# Patient Record
Sex: Male | Born: 1935 | Race: White | Hispanic: No | Marital: Married | Smoking: Never smoker
Health system: Southern US, Community
[De-identification: ages and names within clinical notes are randomized; demographics above are authoritative.]

## PROBLEM LIST (undated history)

## (undated) DIAGNOSIS — Z923 Personal history of irradiation: Secondary | ICD-10-CM

## (undated) DIAGNOSIS — E785 Hyperlipidemia, unspecified: Secondary | ICD-10-CM

## (undated) DIAGNOSIS — M1990 Unspecified osteoarthritis, unspecified site: Secondary | ICD-10-CM

## (undated) DIAGNOSIS — I482 Chronic atrial fibrillation, unspecified: Secondary | ICD-10-CM

## (undated) DIAGNOSIS — I350 Nonrheumatic aortic (valve) stenosis: Secondary | ICD-10-CM

## (undated) DIAGNOSIS — I10 Essential (primary) hypertension: Secondary | ICD-10-CM

## (undated) DIAGNOSIS — C61 Malignant neoplasm of prostate: Secondary | ICD-10-CM

## (undated) DIAGNOSIS — I499 Cardiac arrhythmia, unspecified: Secondary | ICD-10-CM

## (undated) HISTORY — DX: Malignant neoplasm of prostate: C61

## (undated) HISTORY — DX: Hyperlipidemia, unspecified: E78.5

## (undated) HISTORY — PX: CATARACT EXTRACTION, BILATERAL: SHX1313

## (undated) HISTORY — DX: Chronic atrial fibrillation, unspecified: I48.20

## (undated) HISTORY — PX: HERNIA REPAIR: SHX51

## (undated) HISTORY — DX: Nonrheumatic aortic (valve) stenosis: I35.0

## (undated) HISTORY — DX: Essential (primary) hypertension: I10

---

## 1958-08-28 DIAGNOSIS — I059 Rheumatic mitral valve disease, unspecified: Secondary | ICD-10-CM | POA: Insufficient documentation

## 1999-08-25 ENCOUNTER — Encounter: Admission: RE | Admit: 1999-08-25 | Discharge: 1999-08-25 | Payer: Self-pay | Admitting: *Deleted

## 1999-08-25 ENCOUNTER — Encounter: Payer: Self-pay | Admitting: *Deleted

## 2000-07-31 ENCOUNTER — Encounter: Payer: Self-pay | Admitting: Emergency Medicine

## 2000-07-31 ENCOUNTER — Inpatient Hospital Stay (HOSPITAL_COMMUNITY): Admission: EM | Admit: 2000-07-31 | Discharge: 2000-08-01 | Payer: Self-pay | Admitting: *Deleted

## 2001-09-20 ENCOUNTER — Ambulatory Visit (HOSPITAL_BASED_OUTPATIENT_CLINIC_OR_DEPARTMENT_OTHER): Admission: RE | Admit: 2001-09-20 | Discharge: 2001-09-20 | Payer: Self-pay | Admitting: Surgery

## 2001-09-20 ENCOUNTER — Encounter (INDEPENDENT_AMBULATORY_CARE_PROVIDER_SITE_OTHER): Payer: Self-pay | Admitting: Specialist

## 2002-09-16 ENCOUNTER — Inpatient Hospital Stay (HOSPITAL_COMMUNITY): Admission: AD | Admit: 2002-09-16 | Discharge: 2002-09-18 | Payer: Self-pay | Admitting: Cardiology

## 2002-09-16 ENCOUNTER — Encounter: Payer: Self-pay | Admitting: Cardiology

## 2003-12-29 ENCOUNTER — Encounter: Admission: RE | Admit: 2003-12-29 | Discharge: 2003-12-29 | Payer: Self-pay | Admitting: Internal Medicine

## 2005-04-21 ENCOUNTER — Ambulatory Visit: Payer: Self-pay | Admitting: Oncology

## 2006-04-18 ENCOUNTER — Ambulatory Visit: Payer: Self-pay | Admitting: Oncology

## 2006-04-23 LAB — CBC WITH DIFFERENTIAL/PLATELET
BASO%: 0.3 % (ref 0.0–2.0)
Eosinophils Absolute: 0 10*3/uL (ref 0.0–0.5)
HCT: 44.8 % (ref 38.7–49.9)
LYMPH%: 27.8 % (ref 14.0–48.0)
MCHC: 35.3 g/dL (ref 32.0–35.9)
MONO#: 0.5 10*3/uL (ref 0.1–0.9)
NEUT%: 60.4 % (ref 40.0–75.0)
Platelets: 152 10*3/uL (ref 145–400)
WBC: 5.1 10*3/uL (ref 4.0–10.0)

## 2006-06-27 ENCOUNTER — Encounter: Admission: RE | Admit: 2006-06-27 | Discharge: 2006-06-27 | Payer: Self-pay | Admitting: Internal Medicine

## 2009-10-25 DIAGNOSIS — N529 Male erectile dysfunction, unspecified: Secondary | ICD-10-CM | POA: Insufficient documentation

## 2009-10-25 DIAGNOSIS — E1142 Type 2 diabetes mellitus with diabetic polyneuropathy: Secondary | ICD-10-CM | POA: Insufficient documentation

## 2009-10-25 DIAGNOSIS — M810 Age-related osteoporosis without current pathological fracture: Secondary | ICD-10-CM | POA: Insufficient documentation

## 2010-04-11 ENCOUNTER — Ambulatory Visit: Payer: Self-pay | Admitting: Cardiology

## 2010-05-09 ENCOUNTER — Ambulatory Visit: Payer: Self-pay | Admitting: Cardiovascular Disease

## 2010-05-25 ENCOUNTER — Ambulatory Visit: Payer: Self-pay | Admitting: Cardiology

## 2010-05-26 ENCOUNTER — Ambulatory Visit (HOSPITAL_COMMUNITY): Admission: RE | Admit: 2010-05-26 | Discharge: 2010-05-26 | Payer: Self-pay | Admitting: Urology

## 2010-05-29 ENCOUNTER — Ambulatory Visit
Admission: RE | Admit: 2010-05-29 | Discharge: 2010-08-27 | Payer: Self-pay | Source: Home / Self Care | Attending: Radiation Oncology | Admitting: Radiation Oncology

## 2010-06-08 ENCOUNTER — Ambulatory Visit: Payer: Self-pay | Admitting: Cardiology

## 2010-07-04 ENCOUNTER — Ambulatory Visit: Payer: Self-pay | Admitting: Cardiology

## 2010-07-20 ENCOUNTER — Ambulatory Visit: Payer: Self-pay | Admitting: Cardiology

## 2010-07-20 DIAGNOSIS — Z8546 Personal history of malignant neoplasm of prostate: Secondary | ICD-10-CM | POA: Insufficient documentation

## 2010-08-30 ENCOUNTER — Ambulatory Visit
Admission: RE | Admit: 2010-08-30 | Discharge: 2010-09-27 | Payer: Self-pay | Source: Home / Self Care | Attending: Radiation Oncology | Admitting: Radiation Oncology

## 2010-09-27 ENCOUNTER — Ambulatory Visit: Payer: Medicare Other | Attending: Radiation Oncology | Admitting: Radiation Oncology

## 2010-09-27 DIAGNOSIS — C61 Malignant neoplasm of prostate: Secondary | ICD-10-CM | POA: Insufficient documentation

## 2010-09-27 DIAGNOSIS — Z510 Encounter for antineoplastic radiation therapy: Secondary | ICD-10-CM | POA: Insufficient documentation

## 2010-09-28 ENCOUNTER — Ambulatory Visit: Payer: Medicare Other | Attending: Radiation Oncology | Admitting: Radiation Oncology

## 2010-09-28 DIAGNOSIS — C61 Malignant neoplasm of prostate: Secondary | ICD-10-CM | POA: Insufficient documentation

## 2010-09-28 DIAGNOSIS — Z510 Encounter for antineoplastic radiation therapy: Secondary | ICD-10-CM | POA: Insufficient documentation

## 2010-11-30 ENCOUNTER — Ambulatory Visit: Payer: Medicare Other | Attending: Radiation Oncology | Admitting: Radiation Oncology

## 2010-12-21 ENCOUNTER — Other Ambulatory Visit: Payer: Self-pay | Admitting: *Deleted

## 2010-12-21 DIAGNOSIS — Z79899 Other long term (current) drug therapy: Secondary | ICD-10-CM

## 2010-12-21 DIAGNOSIS — E7800 Pure hypercholesterolemia, unspecified: Secondary | ICD-10-CM

## 2010-12-26 ENCOUNTER — Encounter: Payer: Self-pay | Admitting: Cardiology

## 2010-12-26 DIAGNOSIS — I482 Chronic atrial fibrillation, unspecified: Secondary | ICD-10-CM | POA: Insufficient documentation

## 2010-12-26 DIAGNOSIS — E785 Hyperlipidemia, unspecified: Secondary | ICD-10-CM | POA: Insufficient documentation

## 2010-12-26 DIAGNOSIS — C61 Malignant neoplasm of prostate: Secondary | ICD-10-CM | POA: Insufficient documentation

## 2010-12-26 DIAGNOSIS — I10 Essential (primary) hypertension: Secondary | ICD-10-CM | POA: Insufficient documentation

## 2011-01-02 ENCOUNTER — Other Ambulatory Visit (INDEPENDENT_AMBULATORY_CARE_PROVIDER_SITE_OTHER): Payer: Medicare Other | Admitting: *Deleted

## 2011-01-02 ENCOUNTER — Encounter: Payer: Self-pay | Admitting: Cardiology

## 2011-01-02 ENCOUNTER — Ambulatory Visit (INDEPENDENT_AMBULATORY_CARE_PROVIDER_SITE_OTHER): Payer: Medicare Other | Admitting: Cardiology

## 2011-01-02 VITALS — BP 122/86 | HR 70 | Ht 73.0 in | Wt 169.4 lb

## 2011-01-02 DIAGNOSIS — Z79899 Other long term (current) drug therapy: Secondary | ICD-10-CM

## 2011-01-02 DIAGNOSIS — E7800 Pure hypercholesterolemia, unspecified: Secondary | ICD-10-CM

## 2011-01-02 DIAGNOSIS — I4891 Unspecified atrial fibrillation: Secondary | ICD-10-CM

## 2011-01-02 DIAGNOSIS — I482 Chronic atrial fibrillation, unspecified: Secondary | ICD-10-CM

## 2011-01-02 LAB — LIPID PANEL
LDL Cholesterol: 63 mg/dL (ref 0–99)
Total CHOL/HDL Ratio: 3

## 2011-01-02 LAB — HEPATIC FUNCTION PANEL
Albumin: 4.1 g/dL (ref 3.5–5.2)
Alkaline Phosphatase: 32 U/L — ABNORMAL LOW (ref 39–117)
Bilirubin, Direct: 0.1 mg/dL (ref 0.0–0.3)

## 2011-01-02 LAB — CBC WITH DIFFERENTIAL/PLATELET
Basophils Relative: 0.3 % (ref 0.0–3.0)
Eosinophils Absolute: 0 10*3/uL (ref 0.0–0.7)
Lymphs Abs: 1 10*3/uL (ref 0.7–4.0)
MCHC: 35.2 g/dL (ref 30.0–36.0)
MCV: 102.3 fl — ABNORMAL HIGH (ref 78.0–100.0)
Monocytes Absolute: 0.4 10*3/uL (ref 0.1–1.0)
Neutro Abs: 3 10*3/uL (ref 1.4–7.7)
Neutrophils Relative %: 67.7 % (ref 43.0–77.0)
RBC: 3.81 Mil/uL — ABNORMAL LOW (ref 4.22–5.81)

## 2011-01-02 LAB — BASIC METABOLIC PANEL
CO2: 30 mEq/L (ref 19–32)
Chloride: 103 mEq/L (ref 96–112)
Creatinine, Ser: 0.8 mg/dL (ref 0.4–1.5)

## 2011-01-02 NOTE — Assessment & Plan Note (Signed)
Asymptomatic in this point in time. I'll have him return in 6 months to see Dr. [place]. He'll see Dr. Clelia Croft for primary care and lab.

## 2011-01-02 NOTE — Progress Notes (Signed)
Subjective:   Henry Holland is seen today in followup for his chronic atrial fibrillation. He finished radiation therapy for prostate cancer in February and overall has been doing reasonably well. He sees Dr. Clelia Croft for primary care his chronic warfarin is checked by [facility]. He's not had any chest pain, lightheadedness, or dizziness. Lipids are monitored by Dr. Clelia Croft.  Current Outpatient Prescriptions  Medication Sig Dispense Refill  . alendronate (FOSAMAX) 70 MG tablet Take 70 mg by mouth every 7 (seven) days. Take with a full glass of water on an empty stomach.       . Calcium Citrate (CITRACAL PO) Take 600 mg by mouth 2 (two) times daily.        . digoxin (LANOXIN) 0.25 MG tablet Take 250 mcg by mouth daily.        Marland Kitchen diltiazem (CARDIZEM CD) 180 MG 24 hr capsule Take 180 mg by mouth daily.        . fish oil-omega-3 fatty acids 1000 MG capsule Take by mouth daily.        Marland Kitchen losartan (COZAAR) 100 MG tablet Take 100 mg by mouth daily.        . Multiple Vitamin (MULTIVITAMIN) tablet Take 1 tablet by mouth daily.        . rosuvastatin (CRESTOR) 5 MG tablet Take 5 mg by mouth daily.        . Sildenafil Citrate (VIAGRA PO) Take by mouth as needed.        . Vitamin D, Cholecalciferol, 400 UNITS CHEW Chew by mouth daily.        Marland Kitchen warfarin (COUMADIN) 5 MG tablet Take 5 mg by mouth daily. AS DIRECTED         No Known Allergies  Patient Active Problem List  Diagnoses  . Hypertension  . Chronic atrial fibrillation  . Prostate cancer  . Hyperlipidemia    History  Smoking status  . Never Smoker   Smokeless tobacco  . Never Used    History  Alcohol Use No    Family History  Problem Relation Age of Onset  . Stroke Mother 32    Review of Systems:   The patient denies any heat or cold intolerance.  No weight gain or weight loss.  The patient denies headaches or blurry vision.  There is no cough or sputum production.  The patient denies dizziness.  There is no hematuria or hematochezia.  The  patient denies any muscle aches or arthritis.  The patient denies any rash.  The patient denies frequent falling or instability.  There is no history of depression or anxiety.  All other systems were reviewed and are negative.   Physical Exam:   Weights 172 blood pressure is 116/78. Heart rates 80 and irregular.The head is normocephalic and atraumatic.  Pupils are equally round and reactive to light.  Sclerae nonicteric.  Conjunctiva is clear.  Oropharynx is unremarkable.  There's adequate oral airway.  Neck is supple there are no masses.  Thyroid is not enlarged.  There is no lymphadenopathy.  Lungs are clear.  Chest is symmetric.  Heart shows a regular rate and rhythm.  S1 and S2 are normal.  There is no murmur click or gallop.  Abdomen is soft normal bowel sounds.  There is no organomegaly.  Genital and rectal deferred.  Extremities are without edema.  Peripheral pulses are adequate.  Neurologically intact.  Full range of motion.  The patient is not depressed.  Skin is warm and dry.  Assessment / Plan:

## 2011-01-03 ENCOUNTER — Telehealth: Payer: Self-pay | Admitting: Cardiology

## 2011-01-03 DIAGNOSIS — E785 Hyperlipidemia, unspecified: Secondary | ICD-10-CM

## 2011-01-03 DIAGNOSIS — I482 Chronic atrial fibrillation, unspecified: Secondary | ICD-10-CM

## 2011-01-03 NOTE — Telephone Encounter (Signed)
Results from lab. Left message that results were in to call back for further info. Results ok will recheck in six months and reminder letter will be sent.

## 2011-01-04 ENCOUNTER — Telehealth: Payer: Self-pay | Admitting: Cardiology

## 2011-01-04 NOTE — Telephone Encounter (Signed)
Pt returned phone call for lab results. Results reviewed and pt informed of 6 month recheck on labs. Reminder will be sent pt verbalizes understanding.

## 2011-01-13 NOTE — H&P (Signed)
Henry Holland, Henry Holland                               ACCOUNT NO.:  [id number]   MEDICAL RECORD NO.:  [id number]                   PATIENT TYPE:   LOCATION:                                       FACILITY:   PHYSICIAN:  Colleen Can. Deborah Chalk, M.D.            DATE OF BIRTH:  12-23-1932   DATE OF ADMISSION:  09/16/2002  DATE OF DISCHARGE:                                HISTORY & PHYSICAL   CHIEF COMPLAINT:  Paroxysmal atrial fibrillation.   HISTORY OF PRESENT ILLNESS:  The patient is a pleasant 77 year old white  male who has had recurrent atrial fibrillation and atrial flutter. He had a  previous event monitor placed,which demonstrated his atrial arrhythmia. For  the most part, he has been asymptomatic but has had several episodes in  which he felt like his heart rate was approximately 160. Some of his  episodes have been precipitated by chocolate, alcohol, and Viagra, as well  as stress. He has subsequently been started on Coumadin anticoagulation as  well as low dose beta blocker therapy. He is now admitted electively for  initiation of Flecainide.   PAST MEDICAL HISTORY:  1. Paroxysmal atrial fibrillation.  2. Hypertension.  3. History of hernia repair times two.  4. Coumadin therapy.  5. Hyperlipidemia.   ALLERGIES:  None.   CURRENT MEDICATIONS:  1. Coumadin daily.  2. Aspirin daily.  3. Lipitor 10 mg daily.  4. Verapamil 180 mg daily.  5. Multivitamin daily.  6. Cozaar 50 mg per day.  7. Viagra as needed.  8. Toprol XL 25 mg per day.   FAMILY HISTORY:  Father's history is unknown. Mother died at 42 years of  stroke.   SOCIAL HISTORY:  He is widower. His wife died of breast cancer. He is  retired from [employer]. He has one daughter who lives in [place]. He is a  non-smoker. He does have social alcohol use.   REVIEW OF SYSTEMS:  Is as noted above. Otherwise unremarkable.   PHYSICAL EXAMINATION:  GENERAL: Pleasant male who appears younger than  stated age.  VITAL SIGNS:  Blood pressure 110/60 sitting and 120/70 standing. Heart rate  72 and regular. Respiratory rate 18. He is afebrile. Weight 177 pounds.  SKIN: Warm and dry. Color unremarkable.  LUNGS: Clear.  HEART: Regular rate and rhythm.  ABDOMEN: Soft with positive bowel sounds. Nontender.  EXTREMITIES: Without edema.  NEURO: Intact.   LABORATORY DATA:  Pending.   IMPRESSION:  1. Paroxysmal atrial fibrillation/flutter.  2. Hypertension.   PLAN:  Will proceed on with elective admission to initiate Flecainide.  Further treatment plan and follow-up as per Dr. Ronnald Nian discretion.      Juanell Fairly C. Earl Gala, N.P.                 Colleen Can. Deborah Chalk, M.D.    LCO/MEDQ  D:  09/12/2002  T:  09/12/2002  Job:  [id number]   cc:   Colleen Can. Deborah Chalk, M.D.  [address], [address]  [place]  [place] [address]  Fax: [phone]   Wilson Singer, M.D.  [address]  [place]  [place] [address]  Fax: [phone]

## 2011-01-13 NOTE — Op Note (Signed)
[place]  Patient:    Henry Holland, Henry Holland Visit Number: [id number] MRN: [id number]          Service Type: DSU Location: [facility] Attending Physician:  Charlton Haws Dictated by:   Currie Paris, M.D. Proc. Date: 09/20/01 Admit Date:  09/20/2001   CC:         Lilly Cove, M.D.   Operative Report  CCS #[id number]  PREOPERATIVE DIAGNOSIS:  Recurrent right inguinal hernia.  POSTOPERATIVE DIAGNOSIS:  Recurrent right inguinal hernia - indirect.  OPERATION: Repair with mesh.  SURGEON:  Currie Paris, M.D.  ANESTHESIA:  MAC  CLINICAL HISTORY:  This patient is a 77 year old with a recurrent right inguinal hernia.  His original repair was many many years ago.  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  DESCRIPTION OF PROCEDURE:  The patient was seen in the holding area and the operative site identified and marked with the patient standing up, so we could identify the bulge.  He was then taken to the operating room.  IV sedation was given.  The area was shaved, prepped and draped.  Xylocaine 1% mixed equally with 0.5% Marcaine with epinephrine was used for local and infiltrated along the old scar and below fascia at the anterior superior iliac spine.  The medial two-thirds of the old scar was incised.  Subcutaneous tissues were divided as well as some scar tissue where the Scarpas had been repaired, so I was able to identify the external oblique.  The cord was really stuck up into this and had to be carefully dissected off and I dissected and freed up the external oblique.  It had been closed with old wire sutures.  The defect was readily apparent right at the area that had been marked preoperatively and palpated.  It appeared to be coming through the deep ring.  As I freed the cord up some more, I could see that the external oblique had been  closed medial to the deep ring in effect "transposing the cord."  Once I had all this freed up and I had opened up the external oblique a little bit more laterally for better exposure, I dissected into the cord and trimmed off some preperitoneal fat that was coming through as well as identifying and excising a hernia sac.  The floor was really intact medially between the prior repair and the defect was just a large deep ring.  I was able to occlude this nicely with a mesh plug and hold that in place by closing the transversalis over it with a 2-0 Prolene.  I thought this produced a nice occlusion of the defect itself and I decided not to open up the external oblique distally and then try to reclose it over the cord but simply left that since it appeared to be intact alone.  The area was checked for hemostasis and appeared to be dry.  I closed the external oblique with 3-0 Vicryl, Scarpas with 3-0 Vicryl, and the skin with 4-0 Monocryl subcuticular plus Steri-Strips.  The patient tolerated the procedure well.  There were no operative complications.  All counts were correct. Dictated by:   Currie Paris, M.D. Attending Physician:  Charlton Haws DD:  09/20/01 TD:  09/21/01 Job: [id number]

## 2011-01-13 NOTE — Discharge Summary (Signed)
[place]  Patient:    Henry Holland, Henry Holland                          MRN: [id number] Adm. Date:  [id number] Disc. Date: [id number] Attending:  Eleanora Neighbor Dictator:   Jennet Maduro. Earl Gala, R.N., A.N.P.-C. CC:         Georg Ruddle. Viviann Spare, M.D.   Discharge Summary  PRIMARY DISCHARGE DIAGNOSIS:  New onset of atrial flutter with subsequent conversion to normal sinus rhythm.  SECONDARY DISCHARGE DIAGNOSiS:  Hypertension with noted left ventricular hypertrophy on 2-D echocardiogram.  HISTORY OF PRESENT ILLNESS:  Henry Holland is a very pleasant 77 year old white male who presents to the emergency department on July 31, 2000, with new onset atrial flutter.  It was of questionable duration but probably since this past Saturday.  He had had a previous URI, which had varying intensity and severity of symptoms.  There had been no light-headedness, no dizziness, no frank syncope.  He really did not have an awareness of his tachycardia until he took his blood pressure on Monday morning with his kit at home, and there he noted to have a heart rate of 156.  He made an appointment to see Dr. Viviann Spare the following day where he was noted to be exhibiting a narrow QRS complex with tachycardia.  He was subsequently brought to the emergency room and given adenosine and subsequently noted to have an atrial flutter.  He was subsequently admitted for further evaluation.  Please see the dictated history and physical for further patient presentation and profile.  LABORATORY DATA ON ADMISSION:  Hematocrit 49, white count 5, platelets 169. Chemistries were satisfactory with sodium 139, potassium 4.3, chloride 106, CO2 29, BUN 16, creatinine 1, glucose 96.  HOSPITAL COURSE:  The patient was admitted.  He was started on heparin and continued on IV Cardizem that had been initiated in the emergency room.  He was also loaded with Lanoxin.  Approximately 3 a.m. on June 01, 2000,  he converted to normal sinus rhythm.  He was unaware of this.  2-D echocardiogram was performed during this admission which does show a moderate degree of left ventricular hypertrophy.  It is felt that more than likely, his arrhythmia is due to probable longstanding hypertension.  On the evening of August 01, 2000, he was doing well without problems and was felt to be stable for discharge today.  CONDITION ON DISCHARGE:  Improved.  DISCHARGE MEDICATIONS:  He will resume his Zestril 5 mg a day, and he is started on verapamil SR 180 mg a day, and he will take an aspirin daily.  ACTIVITY:  As tolerated.  DIET:  Low fat, low salt.  SPECIAL INSTRUCTIONS:  He is to call our office for any problems.  Otherwise we will plan on seeing him in about 7-10 days.  Will need to set up for stress Cardiolite study at that visit as well as evaluated lipids. DD:  08/02/00 TD:  08/02/00 Job: [id number]

## 2011-01-13 NOTE — Discharge Summary (Signed)
NAMEJANICE, SEALES                             ACCOUNT NO.:  [id number]   MEDICAL RECORD NO.:  [id number]                   PATIENT TYPE:  INP   LOCATION:  2040                                 FACILITY:  [facility]   PHYSICIAN:  Colleen Can. Deborah Chalk, M.D.            DATE OF BIRTH:  Jan 26, 1933   DATE OF ADMISSION:  09/16/2002  DATE OF DISCHARGE:  09/18/2002                                 DISCHARGE SUMMARY   PRIMARY DISCHARGE DIAGNOSIS:  Paroxysmal atrial fibrillation with successful  initiation of flecainide therapy.   SECONDARY DISCHARGE DIAGNOSES:  1. Hypertensive heart disease.  2. Coumadin therapy.  3. Hyperlipidemia.   HISTORY OF PRESENT ILLNESS:  The patient is a pleasant 77 year old male who  has had recurrent atrial fibrillation/flutter.  He had a previous event  monitor placed which demonstrated his atrial arrhythmia.  He was  subsequently started on Coumadin anticoagulation as well as low-dose beta  blocker.  There were possible adverse effects with Toprol; he was  complaining of some voice changes.  He was subsequently admitted electively  for initiation of antiarrhythmic therapy.   Please see dictated history and physical for further patient presentation  and profile.   LABORATORY DATA ON ADMISSION:  Chemistries were normal.   Magnesium was 2.1.   INR was 1.3.   CBC showed a hemoglobin of 14, hematocrit 41, white count 4.9, platelets  122,000.   Chest x-ray is pending final dictation.   Twelve-lead electrocardiogram showed sinus bradycardia.   HOSPITAL COURSE:  The patient was admitted electively to telemetry.  He was  started on flecainide 50 mg b.i.d., which was tolerated well.  He did have  bradycardia on telemetry with heart rates in the low 50s.  His Toprol and  verapamil were subsequently discontinued.  He is tolerating his current  medical regimen without problems.  He has remained in normal sinus rhythm.  Plans are now made for him to be discharged today  with further evaluation on  an outpatient basis.   DISCHARGE CONDITION:  Stable.   DISCHARGE MEDICATIONS:  1. We will stop Toprol.  2. We will start verapamil.   He will stay on:  1. Cozaar 50 mg a day.  2. Lipitor 10 mg a day.  3. Multivitamin as before.  4. Baby aspirin daily.  5. We will continue Coumadin 7.5 mg daily.  6. We will add flecainide 50 mg b.i.d.   FOLLOWUP:  He is to call to set up a treadmill testing in the office in the  next 7 to 10 days and call if problems arise sooner.     Juanell Fairly C. Earl Gala, N.P.                 Colleen Can. Deborah Chalk, M.D.    LCO/MEDQ  D:  09/18/2002  T:  09/19/2002  Job:  [id number]   cc:   Wilson Singer, M.D.  [address]  [place]  [place] [address]  Fax: [phone]

## 2011-01-13 NOTE — Op Note (Signed)
[place]  Patient:    Henry Holland, Henry Holland                          MRN: [id number] Proc. Date: 08/02/00 Adm. Date:  [id number] Disc. Date: [id number] Attending:  Eleanora Neighbor                           Operative Report  NO DICTATION PROVIDED DD:  08/02/00 TD:  08/02/00 Job: [id number]

## 2011-05-12 DIAGNOSIS — M19049 Primary osteoarthritis, unspecified hand: Secondary | ICD-10-CM | POA: Insufficient documentation

## 2011-07-28 ENCOUNTER — Other Ambulatory Visit: Payer: Self-pay | Admitting: Dermatology

## 2011-09-11 DIAGNOSIS — D696 Thrombocytopenia, unspecified: Secondary | ICD-10-CM | POA: Insufficient documentation

## 2011-09-27 ENCOUNTER — Encounter: Payer: Self-pay | Admitting: Cardiology

## 2011-09-27 ENCOUNTER — Ambulatory Visit (INDEPENDENT_AMBULATORY_CARE_PROVIDER_SITE_OTHER): Payer: Medicare Other | Admitting: Cardiology

## 2011-09-27 VITALS — BP 160/90 | HR 61 | Ht 74.0 in | Wt 176.2 lb

## 2011-09-27 DIAGNOSIS — I10 Essential (primary) hypertension: Secondary | ICD-10-CM

## 2011-09-27 DIAGNOSIS — I482 Chronic atrial fibrillation, unspecified: Secondary | ICD-10-CM

## 2011-09-27 DIAGNOSIS — I4891 Unspecified atrial fibrillation: Secondary | ICD-10-CM

## 2011-09-27 DIAGNOSIS — E785 Hyperlipidemia, unspecified: Secondary | ICD-10-CM

## 2011-09-27 MED ORDER — DIGOXIN 125 MCG PO TABS: 125.0000 ug | ORAL_TABLET | Freq: Every day | ORAL | Status: DC

## 2011-09-27 NOTE — Progress Notes (Signed)
Henry Holland Date of Birth: August 09, 1933 Medical Record #[id number]  History of Present Illness: Mr. Henry Holland is seen today to establish cardiac care. He is a former patient of Dr. Deborah Chalk. He has a history of chronic atrial fibrillation dating back to 2002. This has been managed with rate control and anticoagulation. He has no history of coronary disease. He has had several stress tests over the years the last January 2009 which was normal. He had an echocardiogram in 2003 which showed mild mitral and tricuspid insufficiency with normal LV function. He reports that he is doing very well. He works out 5 days a week. His blood pressure is usually low. He denies any dizziness, syncope, chest pain, or shortness of breath. He has no edema or orthopnea.  Current Outpatient Prescriptions on File Prior to Visit  Medication Sig Dispense Refill  . Calcium Citrate (CITRACAL PO) Take 600 mg by mouth 2 (two) times daily.        Marland Kitchen diltiazem (CARDIZEM CD) 180 MG 24 hr capsule Take 180 mg by mouth daily.        . fish oil-omega-3 fatty acids 1000 MG capsule Take by mouth daily.        Marland Kitchen losartan (COZAAR) 100 MG tablet Take 100 mg by mouth daily.        . Multiple Vitamin (MULTIVITAMIN) tablet Take 1 tablet by mouth daily.        . rosuvastatin (CRESTOR) 5 MG tablet Take 5 mg by mouth daily.        . Vitamin D, Cholecalciferol, 400 UNITS CHEW Chew by mouth daily.        Marland Kitchen warfarin (COUMADIN) 5 MG tablet Take 5 mg by mouth daily. AS DIRECTED         No Known Allergies  Past Medical History  Diagnosis Date  . Hypertension   . Chronic atrial fibrillation   . Prostate cancer   . Hyperlipidemia     Past Surgical History  Procedure Date  . Hernia repair     X2    History  Smoking status  . Never Smoker   Smokeless tobacco  . Never Used    History  Alcohol Use No    Family History  Problem Relation Age of Onset  . Stroke Mother 76    Review of Systems: As noted in history of present illness.   All other systems were reviewed and are negative.  Physical Exam: BP 160/90  Pulse 61  Ht 6\' 2"  (1.88 m)  Wt 176 lb 3.2 oz (79.924 kg)  BMI 22.62 kg/m2 He is a pleasant white male in no acute distress.The patient is alert and oriented x 3.  The mood and affect are normal.  The skin is warm and dry.  Color is normal.  The HEENT exam reveals that the sclera are nonicteric.  The mucous membranes are moist.  The carotids are 2+ without bruits.  There is no thyromegaly.  There is no JVD.  The lungs are clear.  The chest wall is non tender.  The heart exam reveals an irregular rate with a normal S1 and S2.  There are no murmurs, gallops, or rubs.  The PMI is not displaced.   Abdominal exam reveals good bowel sounds.  There is no guarding or rebound.  There is no hepatosplenomegaly or tenderness.  There are no masses.  Exam of the legs reveal no clubbing, cyanosis, or edema.  The legs are without rashes.  The distal pulses  are intact.  Cranial nerves II - XII are intact.  Motor and sensory functions are intact.  The gait is normal.  LABORATORY DATA: ECG demonstrates atrial fibrillation with a slow ventricular response rate 61 beats per minute. He has a 2 second pulse noted on ECG. There is an incomplete right bundle branch block. There is ST T wave changes consistent with dig effect.  Assessment / Plan:

## 2011-09-27 NOTE — Patient Instructions (Signed)
Reduce your Lanoxin to .125 mg daily.  Continue your other medication.  I will see you again in one year.

## 2011-09-27 NOTE — Assessment & Plan Note (Signed)
Blood pressure is mildly elevated today but his blood pressure at home has been under excellent control. We will continue his antihypertensive therapy.

## 2011-09-27 NOTE — Assessment & Plan Note (Signed)
I am concerned that he is on too much rate slowing medications. He is asymptomatic. I recommended reducing his Lanoxin to 0.125 mg daily. He will continue with anticoagulation. I will followup again in one year.

## 2011-12-18 ENCOUNTER — Ambulatory Visit: Payer: Medicare Other | Attending: Orthopedic Surgery

## 2011-12-18 DIAGNOSIS — M2569 Stiffness of other specified joint, not elsewhere classified: Secondary | ICD-10-CM | POA: Insufficient documentation

## 2011-12-18 DIAGNOSIS — IMO0001 Reserved for inherently not codable concepts without codable children: Secondary | ICD-10-CM | POA: Insufficient documentation

## 2011-12-18 DIAGNOSIS — M542 Cervicalgia: Secondary | ICD-10-CM | POA: Insufficient documentation

## 2011-12-22 ENCOUNTER — Ambulatory Visit: Payer: Medicare Other

## 2011-12-28 ENCOUNTER — Ambulatory Visit: Payer: Medicare Other | Attending: Orthopedic Surgery

## 2011-12-28 DIAGNOSIS — IMO0001 Reserved for inherently not codable concepts without codable children: Secondary | ICD-10-CM | POA: Insufficient documentation

## 2011-12-28 DIAGNOSIS — M542 Cervicalgia: Secondary | ICD-10-CM | POA: Insufficient documentation

## 2012-08-05 ENCOUNTER — Other Ambulatory Visit: Payer: Self-pay | Admitting: Dermatology

## 2012-09-05 DIAGNOSIS — M2010 Hallux valgus (acquired), unspecified foot: Secondary | ICD-10-CM | POA: Insufficient documentation

## 2012-09-23 DIAGNOSIS — E1121 Type 2 diabetes mellitus with diabetic nephropathy: Secondary | ICD-10-CM | POA: Insufficient documentation

## 2012-10-17 ENCOUNTER — Encounter: Payer: Self-pay | Admitting: Cardiology

## 2012-10-17 ENCOUNTER — Ambulatory Visit (INDEPENDENT_AMBULATORY_CARE_PROVIDER_SITE_OTHER): Payer: Medicare Other | Admitting: Cardiology

## 2012-10-17 VITALS — BP 132/72 | HR 70 | Ht 74.0 in | Wt 176.4 lb

## 2012-10-17 DIAGNOSIS — I482 Chronic atrial fibrillation, unspecified: Secondary | ICD-10-CM

## 2012-10-17 DIAGNOSIS — E785 Hyperlipidemia, unspecified: Secondary | ICD-10-CM

## 2012-10-17 DIAGNOSIS — I4891 Unspecified atrial fibrillation: Secondary | ICD-10-CM

## 2012-10-17 DIAGNOSIS — I10 Essential (primary) hypertension: Secondary | ICD-10-CM

## 2012-10-17 NOTE — Patient Instructions (Signed)
Continue your current therapy  I will see you in 1 year.   

## 2012-10-18 NOTE — Progress Notes (Signed)
Henry Holland Date of Birth: Aug 02, 1933 Medical Record #[id number]  History of Present Illness: Henry Holland is seen today for yearly followup. He has a history of chronic atrial fibrillation dating back to 2002. This has been managed with rate control and anticoagulation. He has no history of coronary disease. He has had several stress tests over the years the last January 2009 which was normal. He had an echocardiogram in 2003 which showed mild mitral and tricuspid insufficiency with normal LV function. He states he has done very well here. He has had no new medical problems. He continues to exercise at least 5 days a week. He denies any dyspnea, dizziness, chest pain, or fatigue. He has had no difficulty with his Coumadin.  Current Outpatient Prescriptions on File Prior to Visit  Medication Sig Dispense Refill  . Calcium Citrate (CITRACAL PO) Take 600 mg by mouth 2 (two) times daily.        . digoxin (LANOXIN) 0.125 MG tablet Take 1 tablet (125 mcg total) by mouth daily.  90 tablet  11  . diltiazem (CARDIZEM CD) 180 MG 24 hr capsule Take 180 mg by mouth daily.        Marland Kitchen losartan (COZAAR) 100 MG tablet Take 100 mg by mouth daily.        . Multiple Vitamin (MULTIVITAMIN) tablet Take 1 tablet by mouth daily.        . rosuvastatin (CRESTOR) 5 MG tablet Take 5 mg by mouth daily.        . Vitamin D, Cholecalciferol, 400 UNITS CHEW Chew by mouth daily.        Marland Kitchen warfarin (COUMADIN) 5 MG tablet Take 5 mg by mouth daily. AS DIRECTED       No current facility-administered medications on file prior to visit.    No Known Allergies  Past Medical History  Diagnosis Date  . Hypertension   . Chronic atrial fibrillation   . Prostate cancer   . Hyperlipidemia     Past Surgical History  Procedure Laterality Date  . Hernia repair      X2    History  Smoking status  . Never Smoker   Smokeless tobacco  . Never Used    History  Alcohol Use No    Family History  Problem Relation Age of Onset  .  Stroke Mother 72    Review of Systems: As noted in history of present illness.  All other systems were reviewed and are negative.  Physical Exam: BP 132/72  Pulse 70  Ht 6\' 2"  (1.88 m)  Wt 176 lb 6.4 oz (80.015 kg)  BMI 22.64 kg/m2  SpO2 99% He is a pleasant white male in no acute distress.The patient is alert and oriented x 3.   The skin is warm and dry.   The HEENT exam is normal.  The carotids are 2+ without bruits.  There is no thyromegaly.  There is no JVD.  The lungs are clear.   The heart exam reveals an irregular rate with a normal S1 and S2.  There are no murmurs, gallops, or rubs.  The PMI is not displaced.   Abdominal exam reveals good bowel sounds.   There is no hepatosplenomegaly or tenderness.  There are no masses.  Exam of the legs reveal no clubbing, cyanosis, or edema.  The legs are without rashes.  The distal pulses are intact.  Cranial nerves II - XII are intact.  Motor and sensory functions are intact.  The  gait is normal.  LABORATORY DATA: ECG demonstrates atrial fibrillation with a controlled ventricular response a rate of 64 beats per minute. He has an incomplete right bundle branch block and nonspecific ST abnormality.  Assessment / Plan: 1. Permanent atrial fibrillation. Rate is well controlled on a lower dose of Lanoxin. Previously he was bradycardic. He is asymptomatic. I recommended continuing Coumadin long-term. We discussed the novel anticoagulant agents but he has done so well on Coumadin I do not see a compelling reason to change them at this point.  2. Hypertension, controlled.

## 2012-12-16 ENCOUNTER — Other Ambulatory Visit: Payer: Self-pay | Admitting: Dermatology

## 2013-03-10 ENCOUNTER — Other Ambulatory Visit: Payer: Self-pay | Admitting: Dermatology

## 2013-06-02 ENCOUNTER — Telehealth: Payer: Self-pay | Admitting: Cardiology

## 2013-06-02 NOTE — Telephone Encounter (Signed)
New Problem  Pt taking digoxin and is requesting a generic//

## 2013-06-02 NOTE — Telephone Encounter (Signed)
Returned call to patient was told digoxin is generic.He stated the price had increased.Stated price is $ 56.00 for 30 tablets.Stated he will continue to take.

## 2013-09-22 ENCOUNTER — Encounter: Payer: Self-pay | Admitting: Cardiology

## 2013-10-27 ENCOUNTER — Encounter: Payer: Self-pay | Admitting: Cardiology

## 2013-10-27 ENCOUNTER — Ambulatory Visit (INDEPENDENT_AMBULATORY_CARE_PROVIDER_SITE_OTHER): Payer: Medicare Other | Admitting: Cardiology

## 2013-10-27 VITALS — BP 123/71 | HR 75 | Ht 74.0 in | Wt 178.0 lb

## 2013-10-27 DIAGNOSIS — I10 Essential (primary) hypertension: Secondary | ICD-10-CM

## 2013-10-27 DIAGNOSIS — I4891 Unspecified atrial fibrillation: Secondary | ICD-10-CM

## 2013-10-27 DIAGNOSIS — E785 Hyperlipidemia, unspecified: Secondary | ICD-10-CM

## 2013-10-27 DIAGNOSIS — I482 Chronic atrial fibrillation, unspecified: Secondary | ICD-10-CM

## 2013-10-27 NOTE — Progress Notes (Signed)
Henry Holland Date of Birth: [birth year] Medical Record #[id number]  History of Present Illness: Mr. Coppola is seen today for yearly followup. He has a history of chronic atrial fibrillation dating back to 2002. This has been managed with rate control and anticoagulation. He has no history of coronary disease. He has had several stress tests over the years the last January 2009 which was normal. He had an echocardiogram in 2003 which showed mild mitral and tricuspid insufficiency with normal LV function. He states he has done very well. He exercises 5 days per week. No chest pain, palpitations, or SOB.  Current Outpatient Prescriptions on File Prior to Visit  Medication Sig Dispense Refill  . alendronate (FOSAMAX) 70 MG tablet Take 70 mg by mouth every 7 (seven) days.       . Calcium Citrate (CITRACAL PO) Take 600 mg by mouth 2 (two) times daily.        Marland Kitchen diltiazem (CARDIZEM CD) 180 MG 24 hr capsule Take 180 mg by mouth daily.        Marland Kitchen losartan (COZAAR) 100 MG tablet Take 100 mg by mouth daily.        . rosuvastatin (CRESTOR) 5 MG tablet Take 5 mg by mouth daily.        Marland Kitchen warfarin (COUMADIN) 5 MG tablet daily. Take 10mg  sun and thurs and 7.5 mg all other days       No current facility-administered medications on file prior to visit.    No Known Allergies  Past Medical History  Diagnosis Date  . Hypertension   . Chronic atrial fibrillation   . Prostate cancer   . Hyperlipidemia     Past Surgical History  Procedure Laterality Date  . Hernia repair      X2    History  Smoking status  . Never Smoker   Smokeless tobacco  . Never Used    History  Alcohol Use No    Family History  Problem Relation Age of Onset  . Stroke Mother 66    Review of Systems: As noted in history of present illness.  All other systems were reviewed and are negative.  Physical Exam: BP 123/71  Pulse 75  Ht 6\' 2"  (1.88 m)  Wt 178 lb (80.74 kg)  BMI 22.84 kg/m2 He is a pleasant white male in no  acute distress.  The HEENT exam is normal.  The carotids are 2+ without bruits.  There is no thyromegaly.  There is no JVD.  The lungs are clear.   The heart exam reveals an irregular rate with a normal S1 and S2.  There are no murmurs, gallops, or rubs.  The PMI is not displaced.   Abdominal exam reveals good bowel sounds.   There is no hepatosplenomegaly or tenderness.  There are no masses.  Exam of the legs reveal no clubbing, cyanosis, or edema.  The distal pulses are intact.  Cranial nerves II - XII are intact.  Motor and sensory functions are intact.    LABORATORY DATA: ECG demonstrates atrial fibrillation with a controlled ventricular response a rate of 75 beats per minute. He has an incomplete right bundle branch block and nonspecific ST abnormality c/w dig effect.  Labs dated 09/22/13 glucose 141, cholesterol 160, triglycerides 117, HDL 40, LDL 97. TSH 1.62. All other chemistries and CBC are normal. A1c 6.4%.  Assessment / Plan: 1. Permanent atrial fibrillation. Rate is well controlled on diltiazem and digoxin.   He is asymptomatic. I recommended  continuing Coumadin long-term. We discussed the novel anticoagulant agents but he has done so well on Coumadin I do not see a compelling reason to change them at this point. He does report the expense of digoxin has increased significantly. We will stop digoxin now. I think his rate will be controlled on diltiazem alone. If need be we could increase this dose.  2. Hypertension, controlled.

## 2013-10-27 NOTE — Patient Instructions (Signed)
Stop taking digoxin  Continue your other therapy  I will see you in one year  

## 2013-11-28 ENCOUNTER — Telehealth: Payer: Self-pay | Admitting: Cardiology

## 2013-11-28 DIAGNOSIS — R001 Bradycardia, unspecified: Secondary | ICD-10-CM

## 2013-11-28 NOTE — Telephone Encounter (Signed)
Spoke with pt, explained his bp is great. He reports he works out every morning but for the last two mornings he has not gone because he has no energy. He has been out working in the yard today and when he bends over, once he stands back up he feels tired. He denies dizziness or other symptoms. He has been off the digoxin for one month. He is taking diltiazem 180 mg daily. Pt was encouraged to stay hydrated. Will make dr Martinique aware of his symptoms and call back with any change. Pt agreed with this plan.

## 2013-11-28 NOTE — Telephone Encounter (Signed)
BP is not an issue. His HR appears low but I don't know how accurate his reading is. Pulse was in the 70s in the office on Dig. We may need to have him wear a Holter monitor for 24 hours to make sure his HR control is good and that he doesn't have significant bradycardia.  Rayden Scheper Martin[place] MD, [facility]

## 2013-11-28 NOTE — Telephone Encounter (Signed)
New message    Recently in the office .    Digoxin was discontinue on  3/2  . C/O no energy. Blood pressure x 3 different time  Patient stated he been working in the yard.   122/64 heart rate  49 - @ 10:00 am   121/65 heart rate  52 - 1 hour later   122/71 heart 51

## 2013-12-01 NOTE — Addendum Note (Signed)
Addended by: Golden Hurter D on: 12/01/2013 11:06 AM   Modules accepted: Orders

## 2013-12-01 NOTE — Telephone Encounter (Signed)
Returned call to patient spoke to wife she stated husband went to work this morning.Dr.Jordan advised to wear a 24 hour holter monitor.Schedulers will call back to schedule.

## 2013-12-02 ENCOUNTER — Encounter: Payer: Self-pay | Admitting: *Deleted

## 2013-12-02 ENCOUNTER — Encounter (INDEPENDENT_AMBULATORY_CARE_PROVIDER_SITE_OTHER): Payer: Medicare Other

## 2013-12-02 DIAGNOSIS — R001 Bradycardia, unspecified: Secondary | ICD-10-CM

## 2013-12-02 DIAGNOSIS — I495 Sick sinus syndrome: Secondary | ICD-10-CM

## 2013-12-02 DIAGNOSIS — I4891 Unspecified atrial fibrillation: Secondary | ICD-10-CM

## 2013-12-02 NOTE — Progress Notes (Signed)
Patient ID: Henry Holland, male   DOB: May 13, 1933, 80 y.o.   MRN: [id number] [facility] 24 hour holter monitor applied to patient.

## 2013-12-03 ENCOUNTER — Telehealth: Payer: Self-pay | Admitting: Cardiology

## 2013-12-03 MED ORDER — FUROSEMIDE 20 MG PO TABS: ORAL_TABLET | ORAL | Status: DC

## 2013-12-03 NOTE — Addendum Note (Signed)
Addended by: Golden Hurter D on: 12/03/2013 11:45 AM   Modules accepted: Orders

## 2013-12-03 NOTE — Telephone Encounter (Signed)
Returned call to patient he stated he has gained 10 lbs.since his last visit with his PCP Dr.Shaw 09/22/13 weighed 175lbs.Last visit with Dr.Jordan 10/27/13 weighed 178 lbs.Today he weighs 186.6 lbs.Stated is sob with exertion,noticed swelling in lower legs 2 days ago,no swelling today.Today B/P 140/80 pulse 74.Stated he will be returning 24 hr heart monitor this afternoon.Message sent to [place] for advice.

## 2013-12-03 NOTE — Telephone Encounter (Signed)
Not sure why he has gained so much weight. Did Dr. Brigitte Pulse do any lab work? If so we should get these results. He needs to strictly restrict his sodium intake. Given lasix 20 mg daily for 3 days. If symptoms do not resolve he will need to be seen.  Peter Martinique MD, [facility]

## 2013-12-03 NOTE — Telephone Encounter (Signed)
Returned call to patient Dr.Jordan advised not sure why the weight gain.Patient stated complete lab done with Dr.Shaw.Dr.Shaw's office called will fax lab work to [place].Patient stated he has always been on a low salt diet, he don't eat salt.Dr.Jordan advised Lasix 20 mg daily for 3 days only.Advised to call me back on Monday 12/08/13 to let me know his condition.

## 2013-12-03 NOTE — Telephone Encounter (Signed)
New message     Talk to a nurse regarding wt gain.

## 2013-12-04 MED ORDER — DILTIAZEM HCL ER COATED BEADS 120 MG PO CP24: 120.0000 mg | ORAL_CAPSULE | Freq: Every day | ORAL | Status: DC

## 2013-12-04 NOTE — Telephone Encounter (Signed)
Patient called no answer.Left message to call me back today we received monitor strips from [facility] need to discuss.

## 2013-12-04 NOTE — Addendum Note (Signed)
Addended by: Golden Hurter D on: 12/04/2013 04:04 PM   Modules accepted: Orders, Medications

## 2013-12-04 NOTE — Telephone Encounter (Signed)
Spoke to patient received Ecardio report which revealed pauses over 3 sec the longest pause 4.87 sec all during sleep.Dr.Jordan out of office.Monitor shown to DOD Dr.Turner she advised to decrease cardizem to 120 mg daily.Patient stated he was feeling better today lost 3 lbs over night.Will show monitor report to [place] next week.Advised call me back on Monday 12/08/13 as planned.

## 2013-12-08 ENCOUNTER — Telehealth: Payer: Self-pay | Admitting: Cardiology

## 2013-12-08 NOTE — Telephone Encounter (Signed)
See previous 12/08/13 note.

## 2013-12-08 NOTE — Telephone Encounter (Signed)
Returned call to patient he stated he is feeling better.Stated swelling is gone,no sob.Wt today 174 lbs4 oz. B/P 142/92,146/88,146/89 pulse [id number].Dr.Jordan will be in office 12/09/13 will show him monitor and let him know B/P, weight.Patient stated he wanted Dr.Jordan's opinion on why he retained fluid.Will talk with Dr.Jordan 12/09/13 and call him back.

## 2013-12-08 NOTE — Telephone Encounter (Signed)
New message  ° ° °Patient calling back to speak with nurse  °

## 2013-12-10 NOTE — Telephone Encounter (Signed)
Returned call to patient no answer.LMTC. 

## 2013-12-15 ENCOUNTER — Telehealth: Payer: Self-pay | Admitting: Cardiology

## 2013-12-15 NOTE — Telephone Encounter (Signed)
Returned call to patient no answer.LMTC. 

## 2013-12-15 NOTE — Telephone Encounter (Signed)
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 Stated his B/P has been running a little high and she will have him call back with readings this afternoon.

## 2013-12-15 NOTE — Telephone Encounter (Signed)
Following up   Pt returning your call please call him back.

## 2013-12-16 NOTE — Telephone Encounter (Signed)
Spoke to patient he stated he was feeling better.Stated his B/P has been ok occasionally elevated.Swelling gone.Advised to call back if he has any problems.Advised to call back in 5/15 to schedule 7/15 appointment.

## 2013-12-16 NOTE — Telephone Encounter (Signed)
See previous 12/16/13 note.

## 2014-03-24 ENCOUNTER — Ambulatory Visit: Payer: Medicare Other | Admitting: Cardiology

## 2014-05-26 ENCOUNTER — Ambulatory Visit (INDEPENDENT_AMBULATORY_CARE_PROVIDER_SITE_OTHER): Payer: Medicare Other | Admitting: Cardiology

## 2014-05-26 ENCOUNTER — Encounter: Payer: Self-pay | Admitting: Cardiology

## 2014-05-26 VITALS — BP 124/74 | HR 78 | Ht 74.0 in | Wt 175.7 lb

## 2014-05-26 DIAGNOSIS — I10 Essential (primary) hypertension: Secondary | ICD-10-CM

## 2014-05-26 DIAGNOSIS — I482 Chronic atrial fibrillation, unspecified: Secondary | ICD-10-CM

## 2014-05-26 DIAGNOSIS — I4891 Unspecified atrial fibrillation: Secondary | ICD-10-CM

## 2014-05-26 NOTE — Patient Instructions (Signed)
Continue your current therapy  I will see you in one year   

## 2014-05-28 NOTE — Progress Notes (Signed)
Henry Holland Date of Birth: [birth year] Medical Record #[id number]  History of Present Illness: Mr. Henry Holland is seen today for yearly followup. He has a history of chronic atrial fibrillation dating back to 2002. This has been managed with rate control and anticoagulation. He has no history of coronary disease. He has had several stress tests over the years the last January 2009 which was normal. He had an echocardiogram in 2003 which showed mild mitral and tricuspid insufficiency with normal LV function. He states he has done very well. He exercises regularly No chest pain, palpitations, or SOB. This past spring he noted some fatigue while working in the yard. He was noted to be bradycardic and his digoxin was stopped and diltiazem was reduced. Since then he has felt well.  Current Outpatient Prescriptions on File Prior to Visit  Medication Sig Dispense Refill  . alendronate (FOSAMAX) 70 MG tablet Take 70 mg by mouth every 7 (seven) days.       . Calcium Citrate (CITRACAL PO) Take 600 mg by mouth 2 (two) times daily.        Marland Kitchen diltiazem (CARDIZEM CD) 120 MG 24 hr capsule Take 1 capsule (120 mg total) by mouth daily.  90 capsule  3  . losartan (COZAAR) 100 MG tablet Take 100 mg by mouth daily.        . rosuvastatin (CRESTOR) 5 MG tablet Take 5 mg by mouth daily.        Marland Kitchen VITAMIN D, ERGOCALCIFEROL, PO Take by mouth. 1 tab daily      . warfarin (COUMADIN) 5 MG tablet daily. Take 10mg  sun and thurs and 7.5 mg all other days       No current facility-administered medications on file prior to visit.    No Known Allergies  Past Medical History  Diagnosis Date  . Hypertension   . Chronic atrial fibrillation   . Prostate cancer   . Hyperlipidemia     Past Surgical History  Procedure Laterality Date  . Hernia repair      X2    History  Smoking status  . Never Smoker   Smokeless tobacco  . Never Used    History  Alcohol Use No    Family History  Problem Relation Age of Onset  .  Stroke Mother 6    Review of Systems: As noted in history of present illness.  All other systems were reviewed and are negative.  Physical Exam: BP 124/74  Pulse 78  Ht 6\' 2"  (1.88 m)  Wt 175 lb 11.2 oz (79.697 kg)  BMI 22.55 kg/m2 He is a pleasant white male in no acute distress.  The HEENT exam is normal.  The carotids are 2+ without bruits.  There is no thyromegaly.  There is no JVD.  The lungs are clear.   The heart exam reveals an irregular rate with a normal S1 and S2.  There are no murmurs, gallops, or rubs.  The PMI is not displaced.   Abdominal exam reveals good bowel sounds.   There is no hepatosplenomegaly or tenderness.  There are no masses.  Exam of the legs reveal no clubbing, cyanosis, or edema.  The distal pulses are intact.  Cranial nerves II - XII are intact.  Motor and sensory functions are intact.    LABORATORY DATA:   Assessment / Plan: 1. Permanent atrial fibrillation. Rate is well controlled on diltiazem at low dose.  He is asymptomatic. I recommended continuing Coumadin long-term. We discussed  the novel anticoagulant agents but he has done so well on Coumadin I do not see a compelling reason to change them at this point.   2. Hypertension, controlled.

## 2014-06-16 ENCOUNTER — Other Ambulatory Visit: Payer: Self-pay | Admitting: Urology

## 2014-06-16 DIAGNOSIS — M8580 Other specified disorders of bone density and structure, unspecified site: Secondary | ICD-10-CM

## 2014-07-13 ENCOUNTER — Ambulatory Visit
Admission: RE | Admit: 2014-07-13 | Discharge: 2014-07-13 | Disposition: A | Discharge status: Home / Self Care | Payer: Medicare Other | Source: Ambulatory Visit | Attending: Urology | Admitting: Urology

## 2014-07-13 DIAGNOSIS — M8580 Other specified disorders of bone density and structure, unspecified site: Secondary | ICD-10-CM

## 2014-12-30 ENCOUNTER — Ambulatory Visit (INDEPENDENT_AMBULATORY_CARE_PROVIDER_SITE_OTHER): Payer: Medicare Other | Admitting: Cardiology

## 2014-12-30 ENCOUNTER — Encounter: Payer: Self-pay | Admitting: Cardiology

## 2014-12-30 VITALS — BP 130/82 | HR 72 | Ht 73.0 in | Wt 175.6 lb

## 2014-12-30 DIAGNOSIS — I482 Chronic atrial fibrillation, unspecified: Secondary | ICD-10-CM

## 2014-12-30 DIAGNOSIS — I10 Essential (primary) hypertension: Secondary | ICD-10-CM | POA: Diagnosis not present

## 2014-12-30 DIAGNOSIS — E785 Hyperlipidemia, unspecified: Secondary | ICD-10-CM | POA: Diagnosis not present

## 2014-12-30 NOTE — Progress Notes (Signed)
   Henry Holland Date of Birth: September 16, 1932 Medical Record #[id number]  History of Present Illness: Henry Holland is seen today for follow up Afib. He has a history of chronic atrial fibrillation dating back to 2002. This has been managed with rate control and anticoagulation. He has no history of coronary disease. He has had several stress tests over the years the last January 2009 which was normal. He had an echocardiogram in 2003 which showed mild mitral and tricuspid insufficiency with normal LV function. He states he has done very well. He exercises regularly No chest pain, palpitations, or SOB. Previously he developed symptomatic bradycardia that resolved with stopping dig and reducing cardizem dose.   Current Outpatient Prescriptions on File Prior to Visit  Medication Sig Dispense Refill  . Calcium Citrate (CITRACAL PO) Take 600 mg by mouth 2 (two) times daily.      Marland Kitchen diltiazem (CARDIZEM CD) 120 MG 24 hr capsule Take 1 capsule (120 mg total) by mouth daily. 90 capsule 3  . losartan (COZAAR) 100 MG tablet Take 100 mg by mouth daily.      . rosuvastatin (CRESTOR) 5 MG tablet Take 5 mg by mouth daily.      Marland Kitchen VITAMIN D, ERGOCALCIFEROL, PO Take by mouth. 1 tab daily    . warfarin (COUMADIN) 5 MG tablet daily. Take 10mg  sun and thurs and 7.5 mg all other days     No current facility-administered medications on file prior to visit.    No Known Allergies  Past Medical History  Diagnosis Date  . Hypertension   . Chronic atrial fibrillation   . Prostate cancer   . Hyperlipidemia     Past Surgical History  Procedure Laterality Date  . Hernia repair      X2    History  Smoking status  . Never Smoker   Smokeless tobacco  . Never Used    History  Alcohol Use No    Family History  Problem Relation Age of Onset  . Stroke Mother 70    Review of Systems: As noted in history of present illness.  All other systems were reviewed and are negative.  Physical Exam: BP 130/82 mmHg  Pulse  72  Ht 6\' 1"  (1.854 m)  Wt 175 lb 9.6 oz (79.652 kg)  BMI 23.17 kg/m2 He is a pleasant white male in no acute distress.  The HEENT exam is normal.  The carotids are 2+ without bruits.  There is no thyromegaly.  There is no JVD.  The lungs are clear.   The heart exam reveals an irregular rate with a normal S1 and S2.  There are no murmurs, gallops, or rubs.  The PMI is not displaced.   Abdominal exam reveals good bowel sounds.   There is no hepatosplenomegaly or tenderness.  There are no masses.  Exam of the legs reveal no clubbing, cyanosis, or edema.  The distal pulses are intact.  Cranial nerves II - XII are intact.  Motor and sensory functions are intact.    LABORATORY DATA: Ecg today shows Afib with rate 72 bpm. Incomplete RBBB. ST-T changes c/w inferior ischemia. No change from last year.   Assessment / Plan: 1. Permanent atrial fibrillation. Rate is well controlled on diltiazem at low dose.  He is asymptomatic. I recommended continuing Coumadin long-term.   2. Hypertension, controlled.   I will follow up in one year.

## 2014-12-30 NOTE — Patient Instructions (Signed)
Continue your current therapy  I will see you in one year   

## 2015-11-22 DIAGNOSIS — M75101 Unspecified rotator cuff tear or rupture of right shoulder, not specified as traumatic: Secondary | ICD-10-CM | POA: Insufficient documentation

## 2016-01-12 ENCOUNTER — Encounter: Payer: Self-pay | Admitting: Cardiology

## 2016-01-12 ENCOUNTER — Ambulatory Visit (INDEPENDENT_AMBULATORY_CARE_PROVIDER_SITE_OTHER): Payer: Medicare Other | Admitting: Cardiology

## 2016-01-12 VITALS — BP 120/74 | HR 82 | Ht 74.0 in | Wt 174.0 lb

## 2016-01-12 DIAGNOSIS — I10 Essential (primary) hypertension: Secondary | ICD-10-CM

## 2016-01-12 DIAGNOSIS — E785 Hyperlipidemia, unspecified: Secondary | ICD-10-CM

## 2016-01-12 DIAGNOSIS — I482 Chronic atrial fibrillation, unspecified: Secondary | ICD-10-CM

## 2016-01-12 NOTE — Progress Notes (Signed)
Henry Holland Date of Birth: [birth year] Medical Record [id number]  History of Present Illness: Henry Holland is seen today for follow up Afib. He has a history of chronic atrial fibrillation dating back to 2002. This has been managed with rate control and anticoagulation. He has no history of coronary disease. He has had several stress tests over the years the last January 2009 which was normal. He had an echocardiogram in 2003 which showed mild mitral and tricuspid insufficiency with normal LV function.  He states he has done very well over the past year. He exercises regularly. No chest pain, palpitations, or SOB. Previously he developed symptomatic bradycardia that resolved with stopping dig and reducing cardizem dose. He reports his coumadin has been controlled but recently INR was low.   Current Outpatient Prescriptions on File Prior to Visit  Medication Sig Dispense Refill  . Calcium Citrate (CITRACAL PO) Take 600 mg by mouth 2 (two) times daily.      Marland Kitchen diltiazem (CARDIZEM CD) 120 MG 24 hr capsule Take 1 capsule (120 mg total) by mouth daily. 90 capsule 3  . losartan (COZAAR) 100 MG tablet Take 100 mg by mouth daily.      . rosuvastatin (CRESTOR) 5 MG tablet Take 5 mg by mouth daily.      Marland Kitchen VITAMIN D, ERGOCALCIFEROL, PO Take by mouth. 1 tab daily    . warfarin (COUMADIN) 5 MG tablet daily. Take 10 mg Sunday, Tuesday, Thursday, and Saturday. Take 7.5 mg all other days.     No current facility-administered medications on file prior to visit.    No Known Allergies  Past Medical History  Diagnosis Date  . Hypertension   . Chronic atrial fibrillation ([place])   . Prostate cancer ([place])   . Hyperlipidemia     Past Surgical History  Procedure Laterality Date  . Hernia repair      X2    History  Smoking status  . Never Smoker   Smokeless tobacco  . Never Used    History  Alcohol Use No    Family History  Problem Relation Age of Onset  . Stroke Mother 21    Review of  Systems: As noted in history of present illness.  All other systems were reviewed and are negative.  Physical Exam: BP 120/74 mmHg  Pulse 82  Ht 6\' 2"  (1.88 m)  Wt 78.926 kg (174 lb)  BMI 22.33 kg/m2 He is a pleasant white male in no acute distress.  The HEENT exam is normal.  The carotids are 2+ without bruits.  There is no thyromegaly.  There is no JVD.  The lungs are clear.   The heart exam reveals an irregular rate with a normal S1 and S2.  There are no murmurs, gallops, or rubs.  The PMI is not displaced.   Abdominal exam reveals good bowel sounds.   There is no hepatosplenomegaly or tenderness.  There are no masses.  Exam of the legs reveal no clubbing, cyanosis, or edema.  The distal pulses are intact.  Cranial nerves II - XII are intact.  Motor and sensory functions are intact.    LABORATORY DATA: Ecg today shows Afib with rate 82 bpm. Incomplete RBBB. ST-T changes c/w inferior ischemia. No change from last year. I have personally reviewed and interpreted this study.  Labs reviewed from primary care in April 2017: glucose 120, sodium 132. Other chemistries, CBC, and TSH normal. Cholesterol 146, trig- 109, HDL 54, LDL 70  Assessment /  Plan: 1. Permanent atrial fibrillation. Rate is well controlled on diltiazem at low dose.  He is asymptomatic. I recommended continuing Coumadin long-term. We discussed DOAC therapy but he has done well on coumadin so I don't see a compelling reason to change.   2. Hypertension, controlled.   I will follow up in one year.

## 2016-01-12 NOTE — Patient Instructions (Signed)
Continue your current therapy  I will see you in one year   

## 2016-03-06 ENCOUNTER — Ambulatory Visit: Payer: Medicare Other | Admitting: Cardiology

## 2016-10-30 ENCOUNTER — Ambulatory Visit (HOSPITAL_COMMUNITY)
Admission: RE | Admit: 2016-10-30 | Discharge: 2016-10-30 | Disposition: A | Discharge status: Home / Self Care | Payer: Medicare Other | Source: Ambulatory Visit | Attending: Internal Medicine | Admitting: Internal Medicine

## 2016-10-30 ENCOUNTER — Encounter (HOSPITAL_COMMUNITY): Payer: Self-pay

## 2016-10-30 DIAGNOSIS — M810 Age-related osteoporosis without current pathological fracture: Secondary | ICD-10-CM | POA: Diagnosis not present

## 2016-10-30 HISTORY — DX: Unspecified osteoarthritis, unspecified site: M19.90

## 2016-10-30 MED ORDER — ZOLEDRONIC ACID 5 MG/100ML IV SOLN
5.0000 mg | Freq: Once | INTRAVENOUS | Status: AC
  Administered 2016-10-30: 5 mg via INTRAVENOUS
  Filled 2016-10-30: qty 100

## 2016-10-30 MED ORDER — SODIUM CHLORIDE 0.9 % IV SOLN
Freq: Once | INTRAVENOUS | Status: AC
  Administered 2016-10-30: 09:00:00 via INTRAVENOUS

## 2016-10-30 NOTE — Discharge Instructions (Signed)
Drink  Fluids/water as tolerated over the next 72 hours Tylenol or ibuprofen over the counter as directed  Continue Calcium and Vit D as directed by your MD Call MD for problems or questions Call 911 for emergencies    Reclast Zoledronic Acid injection (Paget's Disease, Osteoporosis) What is this medicine? ZOLEDRONIC ACID (ZOE le dron ik AS id) lowers the amount of calcium loss from bone. It is used to treat Paget's disease and osteoporosis in women. This medicine may be used for other purposes; ask your health care provider or pharmacist if you have questions. COMMON BRAND NAME(S): Reclast, Zometa What should I tell my health care provider before I take this medicine? They need to know if you have any of these conditions: -aspirin-sensitive asthma -cancer, especially if you are receiving medicines used to treat cancer -dental disease or wear dentures -infection -kidney disease -low levels of calcium in the blood -past surgery on the parathyroid gland or intestines -receiving corticosteroids like dexamethasone or prednisone -an unusual or allergic reaction to zoledronic acid, other medicines, foods, dyes, or preservatives -pregnant or trying to get pregnant -breast-feeding How should I use this medicine? This medicine is for infusion into a vein. It is given by a health care professional in a hospital or clinic setting. Talk to your pediatrician regarding the use of this medicine in children. This medicine is not approved for use in children. Overdosage: If you think you have taken too much of this medicine contact a poison control center or emergency room at once. NOTE: This medicine is only for you. Do not share this medicine with others. What if I miss a dose? It is important not to miss your dose. Call your doctor or health care professional if you are unable to keep an appointment. What may interact with this medicine? -certain antibiotics given by injection -NSAIDs,  medicines for pain and inflammation, like ibuprofen or naproxen -some diuretics like bumetanide, furosemide -teriparatide This list may not describe all possible interactions. Give your health care provider a list of all the medicines, herbs, non-prescription drugs, or dietary supplements you use. Also tell them if you smoke, drink alcohol, or use illegal drugs. Some items may interact with your medicine. What should I watch for while using this medicine? Visit your doctor or health care professional for regular checkups. It may be some time before you see the benefit from this medicine. Do not stop taking your medicine unless your doctor tells you to. Your doctor may order blood tests or other tests to see how you are doing. Women should inform their doctor if they wish to become pregnant or think they might be pregnant. There is a potential for serious side effects to an unborn child. Talk to your health care professional or pharmacist for more information. You should make sure that you get enough calcium and vitamin D while you are taking this medicine. Discuss the foods you eat and the vitamins you take with your health care professional. Some people who take this medicine have severe bone, joint, and/or muscle pain. This medicine may also increase your risk for jaw problems or a broken thigh bone. Tell your doctor right away if you have severe pain in your jaw, bones, joints, or muscles. Tell your doctor if you have any pain that does not go away or that gets worse. Tell your dentist and dental surgeon that you are taking this medicine. You should not have major dental surgery while on this medicine. See your  dentist to have a dental exam and fix any dental problems before starting this medicine. Take good care of your teeth while on this medicine. Make sure you see your dentist for regular follow-up appointments. What side effects may I notice from receiving this medicine? Side effects that you  should report to your doctor or health care professional as soon as possible: -allergic reactions like skin rash, itching or hives, swelling of the face, lips, or tongue -anxiety, confusion, or depression -breathing problems -changes in vision -eye pain -feeling faint or lightheaded, falls -jaw pain, especially after dental work -mouth sores -muscle cramps, stiffness, or weakness -redness, blistering, peeling or loosening of the skin, including inside the mouth -trouble passing urine or change in the amount of urine Side effects that usually do not require medical attention (report to your doctor or health care professional if they continue or are bothersome): -bone, joint, or muscle pain -constipation -diarrhea -fever -hair loss -irritation at site where injected -loss of appetite -nausea, vomiting -stomach upset -trouble sleeping -trouble swallowing -weak or tired This list may not describe all possible side effects. Call your doctor for medical advice about side effects. You may report side effects to FDA at 1-800-FDA-1088. Where should I keep my medicine? This drug is given in a hospital or clinic and will not be stored at home. NOTE: This sheet is a summary. It may not cover all possible information. If you have questions about this medicine, talk to your doctor, pharmacist, or health care provider.  2018 Elsevier/Gold Standard (2014-01-10 14:19:57)

## 2016-11-06 ENCOUNTER — Ambulatory Visit (HOSPITAL_COMMUNITY): Payer: Medicare Other

## 2017-03-15 NOTE — Progress Notes (Signed)
Henry Holland Date of Birth: 01-26-1933 Medical Record #[id number]  History of Present Illness: Mr. Ditommaso is seen today for follow up Afib. He has a history of chronic atrial fibrillation dating back to 2002. This has been managed with rate control and anticoagulation. He has no history of coronary disease. He has had several stress tests over the years the last January 2009 which was normal. He had an echocardiogram in 2003 which showed mild mitral and tricuspid insufficiency with normal LV function.  He has had a good year. No new health concerns.  He exercises regularly. No chest pain, palpitations, or SOB. Previously he developed symptomatic bradycardia that resolved with stopping dig and reducing cardizem dose. He reports his coumadin has been controlled. No bleeding. No TIA or CVA symptoms.   Current Outpatient Prescriptions on File Prior to Visit  Medication Sig Dispense Refill  . Calcium Citrate (CITRACAL PO) Take 600 mg by mouth 2 (two) times daily.      Marland Kitchen diltiazem (CARDIZEM CD) 120 MG 24 hr capsule Take 1 capsule (120 mg total) by mouth daily. 90 capsule 3  . losartan (COZAAR) 100 MG tablet Take 100 mg by mouth daily.      . rosuvastatin (CRESTOR) 5 MG tablet Take 5 mg by mouth daily.      Marland Kitchen VITAMIN D, ERGOCALCIFEROL, PO Take by mouth. 1 tab daily    . warfarin (COUMADIN) 5 MG tablet daily. Take 10 mg Monday, Friday. Take 7.5 mg all other days.     No current facility-administered medications on file prior to visit.     No Known Allergies  Past Medical History:  Diagnosis Date  . Arthritis   . Chronic atrial fibrillation ([place])   . Hyperlipidemia   . Hypertension   . Prostate cancer [facility])     Past Surgical History:  Procedure Laterality Date  . HERNIA REPAIR     X2    History  Smoking Status  . Never Smoker  Smokeless Tobacco  . Never Used    History  Alcohol Use No    Family History  Problem Relation Age of Onset  . Stroke Mother 80    Review of  Systems: As noted in history of present illness.  All other systems were reviewed and are negative.  Physical Exam: BP 119/72   Pulse 70   Ht 6\' 1"  (1.854 m)   Wt 173 lb 9.6 oz (78.7 kg)   BMI 22.90 kg/m  He is a pleasant white male in no acute distress.  The HEENT exam is normal.  The carotids are 2+ without bruits.  There is no thyromegaly.  There is no JVD.  The lungs are clear.   The heart exam reveals an irregular rate with a normal S1 and S2.  There are no murmurs, gallops, or rubs.  The PMI is not displaced.   Abdominal exam reveals good bowel sounds.   There is no hepatosplenomegaly or tenderness.  There are no masses.  Exam of the legs reveal no clubbing, cyanosis, or edema.  The distal pulses are intact.  Cranial nerves II - XII are intact.  Motor and sensory functions are intact.    LABORATORY DATA: Ecg today shows Afib with rate 70 bpm. Incomplete RBBB. ST-T changes c/w inferior ischemia. No change from last year. I have personally reviewed and interpreted this study.  Labs reviewed from primary care in April 2017: glucose 120, sodium 132. Other chemistries, CBC, and TSH normal. Cholesterol 146, trig-  109, HDL 54, LDL 70  Labs dated 12/27/16: cholesterol 133, triglyderides 103, HDL 43, LDL 69. A1c 6%. Normal BMET and TSH.  Assessment / Plan: 1. Permanent atrial fibrillation. Rate is well controlled on diltiazem at low dose.  He is asymptomatic. I recommended continuing Coumadin long-term.  2. Hypertension, controlled.   I will follow up in one year.

## 2017-03-22 ENCOUNTER — Encounter: Payer: Self-pay | Admitting: Cardiology

## 2017-03-22 ENCOUNTER — Ambulatory Visit (INDEPENDENT_AMBULATORY_CARE_PROVIDER_SITE_OTHER): Payer: Medicare Other | Admitting: Cardiology

## 2017-03-22 VITALS — BP 119/72 | HR 70 | Ht 73.0 in | Wt 173.6 lb

## 2017-03-22 DIAGNOSIS — I10 Essential (primary) hypertension: Secondary | ICD-10-CM

## 2017-03-22 DIAGNOSIS — E7800 Pure hypercholesterolemia, unspecified: Secondary | ICD-10-CM | POA: Diagnosis not present

## 2017-03-22 DIAGNOSIS — I482 Chronic atrial fibrillation, unspecified: Secondary | ICD-10-CM

## 2017-03-22 NOTE — Patient Instructions (Signed)
Continue your current therapy  I will see you in one year   

## 2017-09-03 DIAGNOSIS — I480 Paroxysmal atrial fibrillation: Secondary | ICD-10-CM | POA: Diagnosis not present

## 2017-09-03 DIAGNOSIS — Z7901 Long term (current) use of anticoagulants: Secondary | ICD-10-CM | POA: Diagnosis not present

## 2017-09-18 DIAGNOSIS — R69 Illness, unspecified: Secondary | ICD-10-CM | POA: Diagnosis not present

## 2017-10-10 DIAGNOSIS — Z7901 Long term (current) use of anticoagulants: Secondary | ICD-10-CM | POA: Diagnosis not present

## 2017-10-10 DIAGNOSIS — I480 Paroxysmal atrial fibrillation: Secondary | ICD-10-CM | POA: Diagnosis not present

## 2017-10-22 DIAGNOSIS — D1801 Hemangioma of skin and subcutaneous tissue: Secondary | ICD-10-CM | POA: Diagnosis not present

## 2017-10-22 DIAGNOSIS — L570 Actinic keratosis: Secondary | ICD-10-CM | POA: Diagnosis not present

## 2017-10-22 DIAGNOSIS — Z85828 Personal history of other malignant neoplasm of skin: Secondary | ICD-10-CM | POA: Diagnosis not present

## 2017-10-22 DIAGNOSIS — L821 Other seborrheic keratosis: Secondary | ICD-10-CM | POA: Diagnosis not present

## 2017-10-22 DIAGNOSIS — D2239 Melanocytic nevi of other parts of face: Secondary | ICD-10-CM | POA: Diagnosis not present

## 2017-10-22 DIAGNOSIS — L812 Freckles: Secondary | ICD-10-CM | POA: Diagnosis not present

## 2017-11-07 DIAGNOSIS — I480 Paroxysmal atrial fibrillation: Secondary | ICD-10-CM | POA: Diagnosis not present

## 2017-11-07 DIAGNOSIS — Z6825 Body mass index (BMI) 25.0-25.9, adult: Secondary | ICD-10-CM | POA: Diagnosis not present

## 2017-11-07 DIAGNOSIS — Z7901 Long term (current) use of anticoagulants: Secondary | ICD-10-CM | POA: Diagnosis not present

## 2017-11-07 DIAGNOSIS — M810 Age-related osteoporosis without current pathological fracture: Secondary | ICD-10-CM | POA: Diagnosis not present

## 2017-11-07 DIAGNOSIS — I10 Essential (primary) hypertension: Secondary | ICD-10-CM | POA: Diagnosis not present

## 2017-11-26 ENCOUNTER — Ambulatory Visit (HOSPITAL_COMMUNITY)
Admission: RE | Admit: 2017-11-26 | Discharge: 2017-11-26 | Disposition: A | Discharge status: Home / Self Care | Payer: Medicare HMO | Source: Ambulatory Visit | Attending: Internal Medicine | Admitting: Internal Medicine

## 2017-11-26 ENCOUNTER — Encounter (HOSPITAL_COMMUNITY): Payer: Self-pay

## 2017-11-26 DIAGNOSIS — M810 Age-related osteoporosis without current pathological fracture: Secondary | ICD-10-CM | POA: Insufficient documentation

## 2017-11-26 HISTORY — DX: Personal history of irradiation: Z92.3

## 2017-11-26 HISTORY — DX: Cardiac arrhythmia, unspecified: I49.9

## 2017-11-26 MED ORDER — LACTATED RINGERS IV SOLN: INTRAVENOUS | Status: DC

## 2017-11-26 MED ORDER — SODIUM CHLORIDE 0.9 % IV SOLN
Freq: Once | INTRAVENOUS | Status: AC
  Administered 2017-11-26: 08:00:00 via INTRAVENOUS

## 2017-11-26 MED ORDER — ZOLEDRONIC ACID 5 MG/100ML IV SOLN
5.0000 mg | Freq: Once | INTRAVENOUS | Status: AC
  Administered 2017-11-26: 5 mg via INTRAVENOUS
  Filled 2017-11-26: qty 100

## 2017-11-26 NOTE — Discharge Instructions (Signed)
Zoledronic Acid injection (Paget's Disease, Osteoporosis)/Reclast Infusion  What is this medicine? ZOLEDRONIC ACID (ZOE le dron ik AS id) lowers the amount of calcium loss from bone. It is used to treat Paget's disease and osteoporosis in women. This medicine may be used for other purposes; ask your health care provider or pharmacist if you have questions. COMMON BRAND NAME(S): Reclast, Zometa What should I tell my health care provider before I take this medicine? They need to know if you have any of these conditions: -aspirin-sensitive asthma -cancer, especially if you are receiving medicines used to treat cancer -dental disease or wear dentures -infection -kidney disease -low levels of calcium in the blood -past surgery on the parathyroid gland or intestines -receiving corticosteroids like dexamethasone or prednisone -an unusual or allergic reaction to zoledronic acid, other medicines, foods, dyes, or preservatives -pregnant or trying to get pregnant -breast-feeding How should I use this medicine? This medicine is for infusion into a vein. It is given by a health care professional in a hospital or clinic setting. Talk to your pediatrician regarding the use of this medicine in children. This medicine is not approved for use in children. Overdosage: If you think you have taken too much of this medicine contact a poison control center or emergency room at once. NOTE: This medicine is only for you. Do not share this medicine with others. What if I miss a dose? It is important not to miss your dose. Call your doctor or health care professional if you are unable to keep an appointment. What may interact with this medicine? -certain antibiotics given by injection -NSAIDs, medicines for pain and inflammation, like ibuprofen or naproxen -some diuretics like bumetanide, furosemide -teriparatide This list may not describe all possible interactions. Give your health care provider a list of all  the medicines, herbs, non-prescription drugs, or dietary supplements you use. Also tell them if you smoke, drink alcohol, or use illegal drugs. Some items may interact with your medicine. What should I watch for while using this medicine? Visit your doctor or health care professional for regular checkups. It may be some time before you see the benefit from this medicine. Do not stop taking your medicine unless your doctor tells you to. Your doctor may order blood tests or other tests to see how you are doing. Women should inform their doctor if they wish to become pregnant or think they might be pregnant. There is a potential for serious side effects to an unborn child. Talk to your health care professional or pharmacist for more information. You should make sure that you get enough calcium and vitamin D while you are taking this medicine. Discuss the foods you eat and the vitamins you take with your health care professional. Some people who take this medicine have severe bone, joint, and/or muscle pain. This medicine may also increase your risk for jaw problems or a broken thigh bone. Tell your doctor right away if you have severe pain in your jaw, bones, joints, or muscles. Tell your doctor if you have any pain that does not go away or that gets worse. Tell your dentist and dental surgeon that you are taking this medicine. You should not have major dental surgery while on this medicine. See your dentist to have a dental exam and fix any dental problems before starting this medicine. Take good care of your teeth while on this medicine. Make sure you see your dentist for regular follow-up appointments. What side effects may I notice from receiving  this medicine? °Side effects that you should report to your doctor or health care professional as soon as possible: °-allergic reactions like skin rash, itching or hives, swelling of the face, lips, or tongue °-anxiety, confusion, or depression °-breathing  problems °-changes in vision °-eye pain °-feeling faint or lightheaded, falls °-jaw pain, especially after dental work °-mouth sores °-muscle cramps, stiffness, or weakness °-redness, blistering, peeling or loosening of the skin, including inside the mouth °-trouble passing urine or change in the amount of urine °Side effects that usually do not require medical attention (report to your doctor or health care professional if they continue or are bothersome): °-bone, joint, or muscle pain °-constipation °-diarrhea °-fever °-hair loss °-irritation at site where injected °-loss of appetite °-nausea, vomiting °-stomach upset °-trouble sleeping °-trouble swallowing °-weak or tired °This list may not describe all possible side effects. Call your doctor for medical advice about side effects. You may report side effects to FDA at 1-800-FDA-1088. °Where should I keep my medicine? °This drug is given in a hospital or clinic and will not be stored at home. °NOTE: This sheet is a summary. It may not cover all possible information. If you have questions about this medicine, talk to your doctor, pharmacist, or health care provider. °© 2018 Elsevier/Gold Standard (2014-01-10 14:19:57) ° °

## 2017-12-03 DIAGNOSIS — R69 Illness, unspecified: Secondary | ICD-10-CM | POA: Diagnosis not present

## 2017-12-10 DIAGNOSIS — Z7901 Long term (current) use of anticoagulants: Secondary | ICD-10-CM | POA: Diagnosis not present

## 2017-12-10 DIAGNOSIS — I480 Paroxysmal atrial fibrillation: Secondary | ICD-10-CM | POA: Diagnosis not present

## 2018-01-11 DIAGNOSIS — I10 Essential (primary) hypertension: Secondary | ICD-10-CM | POA: Diagnosis not present

## 2018-01-11 DIAGNOSIS — E7849 Other hyperlipidemia: Secondary | ICD-10-CM | POA: Diagnosis not present

## 2018-01-11 DIAGNOSIS — R82998 Other abnormal findings in urine: Secondary | ICD-10-CM | POA: Diagnosis not present

## 2018-01-11 DIAGNOSIS — M810 Age-related osteoporosis without current pathological fracture: Secondary | ICD-10-CM | POA: Diagnosis not present

## 2018-01-11 DIAGNOSIS — E1140 Type 2 diabetes mellitus with diabetic neuropathy, unspecified: Secondary | ICD-10-CM | POA: Diagnosis not present

## 2018-01-11 DIAGNOSIS — Z125 Encounter for screening for malignant neoplasm of prostate: Secondary | ICD-10-CM | POA: Diagnosis not present

## 2018-01-14 DIAGNOSIS — L259 Unspecified contact dermatitis, unspecified cause: Secondary | ICD-10-CM | POA: Diagnosis not present

## 2018-01-29 DIAGNOSIS — Z0000 Encounter for general adult medical examination without abnormal findings: Secondary | ICD-10-CM | POA: Diagnosis not present

## 2018-01-29 DIAGNOSIS — Z7901 Long term (current) use of anticoagulants: Secondary | ICD-10-CM | POA: Diagnosis not present

## 2018-01-29 DIAGNOSIS — I480 Paroxysmal atrial fibrillation: Secondary | ICD-10-CM | POA: Diagnosis not present

## 2018-01-29 DIAGNOSIS — Z6823 Body mass index (BMI) 23.0-23.9, adult: Secondary | ICD-10-CM | POA: Diagnosis not present

## 2018-01-29 DIAGNOSIS — E1129 Type 2 diabetes mellitus with other diabetic kidney complication: Secondary | ICD-10-CM | POA: Diagnosis not present

## 2018-01-29 DIAGNOSIS — E1140 Type 2 diabetes mellitus with diabetic neuropathy, unspecified: Secondary | ICD-10-CM | POA: Diagnosis not present

## 2018-01-29 DIAGNOSIS — E7849 Other hyperlipidemia: Secondary | ICD-10-CM | POA: Diagnosis not present

## 2018-01-29 DIAGNOSIS — I10 Essential (primary) hypertension: Secondary | ICD-10-CM | POA: Diagnosis not present

## 2018-01-29 DIAGNOSIS — M810 Age-related osteoporosis without current pathological fracture: Secondary | ICD-10-CM | POA: Diagnosis not present

## 2018-01-29 DIAGNOSIS — Z8546 Personal history of malignant neoplasm of prostate: Secondary | ICD-10-CM | POA: Diagnosis not present

## 2018-01-31 DIAGNOSIS — H25013 Cortical age-related cataract, bilateral: Secondary | ICD-10-CM | POA: Diagnosis not present

## 2018-01-31 DIAGNOSIS — H35363 Drusen (degenerative) of macula, bilateral: Secondary | ICD-10-CM | POA: Diagnosis not present

## 2018-01-31 DIAGNOSIS — H40013 Open angle with borderline findings, low risk, bilateral: Secondary | ICD-10-CM | POA: Diagnosis not present

## 2018-01-31 DIAGNOSIS — E119 Type 2 diabetes mellitus without complications: Secondary | ICD-10-CM | POA: Diagnosis not present

## 2018-03-04 DIAGNOSIS — Z7901 Long term (current) use of anticoagulants: Secondary | ICD-10-CM | POA: Diagnosis not present

## 2018-03-04 DIAGNOSIS — I480 Paroxysmal atrial fibrillation: Secondary | ICD-10-CM | POA: Diagnosis not present

## 2018-03-11 DIAGNOSIS — R69 Illness, unspecified: Secondary | ICD-10-CM | POA: Diagnosis not present

## 2018-03-12 DIAGNOSIS — I480 Paroxysmal atrial fibrillation: Secondary | ICD-10-CM | POA: Diagnosis not present

## 2018-03-12 DIAGNOSIS — Z7901 Long term (current) use of anticoagulants: Secondary | ICD-10-CM | POA: Diagnosis not present

## 2018-03-14 DIAGNOSIS — E119 Type 2 diabetes mellitus without complications: Secondary | ICD-10-CM | POA: Diagnosis not present

## 2018-03-19 DIAGNOSIS — R69 Illness, unspecified: Secondary | ICD-10-CM | POA: Diagnosis not present

## 2018-03-22 ENCOUNTER — Encounter

## 2018-04-01 DIAGNOSIS — I480 Paroxysmal atrial fibrillation: Secondary | ICD-10-CM | POA: Diagnosis not present

## 2018-04-01 DIAGNOSIS — Z7901 Long term (current) use of anticoagulants: Secondary | ICD-10-CM | POA: Diagnosis not present

## 2018-04-22 DIAGNOSIS — L821 Other seborrheic keratosis: Secondary | ICD-10-CM | POA: Diagnosis not present

## 2018-04-22 DIAGNOSIS — D1801 Hemangioma of skin and subcutaneous tissue: Secondary | ICD-10-CM | POA: Diagnosis not present

## 2018-04-22 DIAGNOSIS — Z85828 Personal history of other malignant neoplasm of skin: Secondary | ICD-10-CM | POA: Diagnosis not present

## 2018-04-22 DIAGNOSIS — L570 Actinic keratosis: Secondary | ICD-10-CM | POA: Diagnosis not present

## 2018-04-22 DIAGNOSIS — L812 Freckles: Secondary | ICD-10-CM | POA: Diagnosis not present

## 2018-05-06 ENCOUNTER — Encounter: Payer: Self-pay | Admitting: Cardiology

## 2018-05-06 DIAGNOSIS — I480 Paroxysmal atrial fibrillation: Secondary | ICD-10-CM | POA: Diagnosis not present

## 2018-05-06 DIAGNOSIS — Z7901 Long term (current) use of anticoagulants: Secondary | ICD-10-CM | POA: Diagnosis not present

## 2018-05-18 NOTE — Progress Notes (Signed)
Henry Holland Date of Birth: Jan 04, 1933 Medical Record #[id number]  History of Present Illness: Henry Holland is seen today for follow up Afib. He has a history of chronic atrial fibrillation dating back to 2002. This has been managed with rate control and anticoagulation. He has no history of coronary disease. He has had several stress tests over the years the last January 2009 which was normal. He had an echocardiogram in 2003 which showed mild mitral and tricuspid insufficiency with normal LV function.  He has had a good year. No new health concerns.  He exercises regularly and does a lot of yard work. No chest pain, palpitations, or SOB.  He reports his coumadin has been controlled. No bleeding. No TIA or CVA symptoms.   Current Outpatient Medications on File Prior to Visit  Medication Sig Dispense Refill  . Calcium Citrate (CITRACAL PO) Take 600 mg by mouth 2 (two) times daily.      Marland Kitchen diltiazem (CARDIZEM CD) 120 MG 24 hr capsule Take 1 capsule (120 mg total) by mouth daily. 90 capsule 3  . losartan (COZAAR) 100 MG tablet Take 100 mg by mouth daily.      . rosuvastatin (CRESTOR) 5 MG tablet Take 5 mg by mouth daily.      Marland Kitchen VITAMIN D, ERGOCALCIFEROL, PO Take by mouth. 1 tab daily    . warfarin (COUMADIN) 5 MG tablet daily. Take 10 mg Monday, Friday. Take 7.5 mg all other days.     No current facility-administered medications on file prior to visit.     No Known Allergies  Past Medical History:  Diagnosis Date  . Arthritis   . Chronic atrial fibrillation ([place])   . Dysrhythmia   . History of radiation therapy   . Hyperlipidemia   . Hypertension   . Prostate cancer [facility])     Past Surgical History:  Procedure Laterality Date  . HERNIA REPAIR     X2    Social History   Tobacco Use  Smoking Status Never Smoker  Smokeless Tobacco Never Used    Social History   Substance and Sexual Activity  Alcohol Use No    Family History  Problem Relation Age of Onset  . Stroke Mother 30     Review of Systems: As noted in history of present illness.  All other systems were reviewed and are negative.  Physical Exam: BP (!) 142/80   Pulse 72   Ht 6' 1.5" (1.867 m)   Wt 174 lb 3.2 oz (79 kg)   BMI 22.67 kg/m  GENERAL:  Well appearing WM in NAD HEENT:  PERRL, EOMI, sclera are clear. Oropharynx is clear. NECK:  No jugular venous distention, carotid upstroke brisk and symmetric, no bruits, no thyromegaly or adenopathy LUNGS:  Clear to auscultation bilaterally CHEST:  Unremarkable HEART:  RRR,  PMI not displaced or sustained,S1 and S2 within normal limits, no S3, no S4: no clicks, no rubs, no murmurs ABD:  Soft, nontender. BS +, no masses or bruits. No hepatomegaly, no splenomegaly EXT:  2 + pulses throughout, no edema, no cyanosis no clubbing SKIN:  Warm and dry.  No rashes NEURO:  Alert and oriented x 3. Cranial nerves II through XII intact. PSYCH:  Cognitively intact    LABORATORY DATA: Ecg today shows Afib with rate 72 bpm. Incomplete RBBB. ST-T changes c/w inferior ischemia. No change from last year. I have personally reviewed and interpreted this study.  Labs reviewed from primary care in April 2017: glucose  120, sodium 132. Other chemistries, CBC, and TSH normal. Cholesterol 146, trig- 109, HDL 54, LDL 70  Labs dated 12/27/16: cholesterol 133, triglyderides 103, HDL 43, LDL 69. A1c 6%. Normal BMET and TSH. Dated 01/11/18: cholesterol 128, triglycerides 80, HDL 42, LDL 70. A1c 5.8%. Chemistries, TSH, CBC normal.   Assessment / Plan: 1. Permanent atrial fibrillation. Rate is well controlled on diltiazem at low dose.  He is asymptomatic. I recommended continuing Coumadin long-term.  2. Hypertension, controlled.   I will follow up in one year.

## 2018-05-20 ENCOUNTER — Encounter: Payer: Self-pay | Admitting: Cardiology

## 2018-05-20 ENCOUNTER — Ambulatory Visit: Payer: Medicare HMO | Admitting: Cardiology

## 2018-05-20 VITALS — BP 142/80 | HR 72 | Ht 73.5 in | Wt 174.2 lb

## 2018-05-20 DIAGNOSIS — I10 Essential (primary) hypertension: Secondary | ICD-10-CM | POA: Diagnosis not present

## 2018-05-20 DIAGNOSIS — I482 Chronic atrial fibrillation, unspecified: Secondary | ICD-10-CM

## 2018-05-20 NOTE — Patient Instructions (Signed)
Continue your current therapy  I will see you in one year   

## 2018-06-07 DIAGNOSIS — Z7901 Long term (current) use of anticoagulants: Secondary | ICD-10-CM | POA: Diagnosis not present

## 2018-06-07 DIAGNOSIS — I480 Paroxysmal atrial fibrillation: Secondary | ICD-10-CM | POA: Diagnosis not present

## 2018-06-17 DIAGNOSIS — Z8546 Personal history of malignant neoplasm of prostate: Secondary | ICD-10-CM | POA: Diagnosis not present

## 2018-06-19 DIAGNOSIS — C61 Malignant neoplasm of prostate: Secondary | ICD-10-CM | POA: Diagnosis not present

## 2018-06-19 DIAGNOSIS — R351 Nocturia: Secondary | ICD-10-CM | POA: Diagnosis not present

## 2018-07-01 DIAGNOSIS — C61 Malignant neoplasm of prostate: Secondary | ICD-10-CM | POA: Diagnosis not present

## 2018-07-08 DIAGNOSIS — I480 Paroxysmal atrial fibrillation: Secondary | ICD-10-CM | POA: Diagnosis not present

## 2018-07-08 DIAGNOSIS — Z7901 Long term (current) use of anticoagulants: Secondary | ICD-10-CM | POA: Diagnosis not present

## 2018-08-01 DIAGNOSIS — Z7901 Long term (current) use of anticoagulants: Secondary | ICD-10-CM | POA: Diagnosis not present

## 2018-08-01 DIAGNOSIS — E1129 Type 2 diabetes mellitus with other diabetic kidney complication: Secondary | ICD-10-CM | POA: Diagnosis not present

## 2018-08-01 DIAGNOSIS — E1140 Type 2 diabetes mellitus with diabetic neuropathy, unspecified: Secondary | ICD-10-CM | POA: Diagnosis not present

## 2018-08-01 DIAGNOSIS — E7849 Other hyperlipidemia: Secondary | ICD-10-CM | POA: Diagnosis not present

## 2018-08-01 DIAGNOSIS — M810 Age-related osteoporosis without current pathological fracture: Secondary | ICD-10-CM | POA: Diagnosis not present

## 2018-08-01 DIAGNOSIS — I480 Paroxysmal atrial fibrillation: Secondary | ICD-10-CM | POA: Diagnosis not present

## 2018-08-01 DIAGNOSIS — Z6823 Body mass index (BMI) 23.0-23.9, adult: Secondary | ICD-10-CM | POA: Diagnosis not present

## 2018-08-01 DIAGNOSIS — I10 Essential (primary) hypertension: Secondary | ICD-10-CM | POA: Diagnosis not present

## 2018-08-15 ENCOUNTER — Emergency Department (HOSPITAL_COMMUNITY)
Admission: EM | Admit: 2018-08-15 | Discharge: 2018-08-15 | Disposition: A | Discharge status: Home / Self Care | Payer: Medicare HMO | Attending: Emergency Medicine | Admitting: Emergency Medicine

## 2018-08-15 ENCOUNTER — Other Ambulatory Visit: Payer: Self-pay

## 2018-08-15 ENCOUNTER — Emergency Department (HOSPITAL_COMMUNITY): Payer: Medicare HMO

## 2018-08-15 ENCOUNTER — Encounter (HOSPITAL_COMMUNITY): Payer: Self-pay | Admitting: *Deleted

## 2018-08-15 DIAGNOSIS — Z7901 Long term (current) use of anticoagulants: Secondary | ICD-10-CM | POA: Diagnosis not present

## 2018-08-15 DIAGNOSIS — Z79899 Other long term (current) drug therapy: Secondary | ICD-10-CM | POA: Insufficient documentation

## 2018-08-15 DIAGNOSIS — R55 Syncope and collapse: Secondary | ICD-10-CM | POA: Diagnosis not present

## 2018-08-15 DIAGNOSIS — R202 Paresthesia of skin: Secondary | ICD-10-CM | POA: Diagnosis not present

## 2018-08-15 DIAGNOSIS — I482 Chronic atrial fibrillation, unspecified: Secondary | ICD-10-CM | POA: Diagnosis not present

## 2018-08-15 DIAGNOSIS — M545 Low back pain: Secondary | ICD-10-CM | POA: Diagnosis not present

## 2018-08-15 DIAGNOSIS — I10 Essential (primary) hypertension: Secondary | ICD-10-CM | POA: Insufficient documentation

## 2018-08-15 LAB — BASIC METABOLIC PANEL
ANION GAP: 17 — AB (ref 5–15)
BUN: 25 mg/dL — ABNORMAL HIGH (ref 8–23)
CO2: 19 mmol/L — ABNORMAL LOW (ref 22–32)
Calcium: 9.7 mg/dL (ref 8.9–10.3)
Chloride: 100 mmol/L (ref 98–111)
Creatinine, Ser: 0.93 mg/dL (ref 0.61–1.24)
GFR calc Af Amer: >60 mL/min (ref >60)
Glucose, Bld: 215 mg/dL — ABNORMAL HIGH (ref 70–99)
POTASSIUM: 4.1 mmol/L (ref 3.5–5.1)
SODIUM: 136 mmol/L (ref 135–145)

## 2018-08-15 LAB — CBC WITH DIFFERENTIAL/PLATELET
ABS IMMATURE GRANULOCYTES: 0.01 10*3/uL (ref 0.00–0.07)
BASOS PCT: 0 %
Basophils Absolute: 0 10*3/uL (ref 0.0–0.1)
Eosinophils Absolute: 0 10*3/uL (ref 0.0–0.5)
Eosinophils Relative: 0 %
HCT: 40.5 % (ref 39.0–52.0)
Hemoglobin: 13.5 g/dL (ref 13.0–17.0)
IMMATURE GRANULOCYTES: 0 %
Lymphocytes Relative: 18 %
Lymphs Abs: 1.1 10*3/uL (ref 0.7–4.0)
MCH: 33.7 pg (ref 26.0–34.0)
MCHC: 33.3 g/dL (ref 30.0–36.0)
MCV: 101 fL — ABNORMAL HIGH (ref 80.0–100.0)
Monocytes Absolute: 0.4 10*3/uL (ref 0.1–1.0)
Monocytes Relative: 6 %
NEUTROS ABS: 4.6 10*3/uL (ref 1.7–7.7)
NEUTROS PCT: 76 %
NRBC: 0 % (ref 0.0–0.2)
PLATELETS: 151 10*3/uL (ref 150–400)
RBC: 4.01 MIL/uL — AB (ref 4.22–5.81)
RDW: 12.9 % (ref 11.5–15.5)
WBC: 6.1 10*3/uL (ref 4.0–10.5)

## 2018-08-15 LAB — PROTIME-INR
INR: 2.96
Prothrombin Time: 30.4 seconds — ABNORMAL HIGH (ref 11.4–15.2)

## 2018-08-15 LAB — I-STAT TROPONIN, ED: Troponin i, poc: 0.01 ng/mL (ref 0.00–0.08)

## 2018-08-15 NOTE — ED Triage Notes (Signed)
Pt reports he was at dinner tonight and his hands started burning. When he stood up "everything went dark" and he felt like he might pass out. He says he feels better now, no chest pain or shortness of breath. He has redness to his abdomen and back that he says is new.

## 2018-08-15 NOTE — Discharge Instructions (Addendum)
You were seen in the emergency department for some burning pain in your lower extremities for a few weeks along with a near fainting episode tonight.  You had blood work EKG CAT scan of your head and some x-rays of your back.  We did not find an obvious cause of your symptoms.  You did have a small compression fracture and some arthritis in your lower back.  Will be important for you to follow-up with your doctor regarding these things and return if you have any worsening symptoms.

## 2018-08-15 NOTE — ED Provider Notes (Signed)
[facility] DEPT Provider Note   CSN: [id number] Arrival date & time: 08/15/18  1950     History   Chief Complaint Chief Complaint  Patient presents with  . Near Syncope    HPI Henry Holland is a 84 y.o. male.  He has a history of prostate cancer and chronic A. fib on Coumadin.  He is here after a presyncopal event.  He has been troubled by 3 or 4 weeks of pain in his buttocks that radiates down his legs.  He calls it burning in nature and seems to be positional bothering him mostly when he is in the car.  It usually is better when he is able to get up and move around and does not bother him at nighttime.  He was at a restaurant tonight when the pain was really bothering him and then he started noticing some burning in his hands.  He stood up to go walk and he felt acutely lightheaded and his vision dimmed and he felt like he might pass out.  This feeling lasted about 10 minutes and improved with him getting outside and sitting on a wall.  It was not associated with any headache or chest pain.  No shortness of breath.  No recent medication changes.  He did have a fall a few weeks ago but it sounds like his leg symptoms were before that.  The history is provided by the patient.  Near Syncope  This is a new problem. The current episode started 1 to 2 hours ago. The problem has been resolved. Pertinent negatives include no chest pain, no abdominal pain, no headaches and no shortness of breath. Nothing aggravates the symptoms. The symptoms are relieved by position. He has tried acetaminophen for the symptoms. The treatment provided moderate relief.    Past Medical History:  Diagnosis Date  . Arthritis   . Chronic atrial fibrillation   . Dysrhythmia   . History of radiation therapy   . Hyperlipidemia   . Hypertension   . Prostate cancer [facility])     Patient Active Problem List   Diagnosis Date Noted  . Hypertension   . Chronic atrial fibrillation   .  Prostate cancer ([place])   . Hyperlipidemia     Past Surgical History:  Procedure Laterality Date  . HERNIA REPAIR     X2        Home Medications    Prior to Admission medications   Medication Sig Start Date End Date Taking? Authorizing Provider  Calcium Citrate (CITRACAL PO) Take 600 mg by mouth 2 (two) times daily.      [provider]  diltiazem (CARDIZEM CD) 120 MG 24 hr capsule Take 1 capsule (120 mg total) by mouth daily. 12/04/13   Sueanne Margarita, MD  losartan (COZAAR) 100 MG tablet Take 100 mg by mouth daily.      [provider]  rosuvastatin (CRESTOR) 5 MG tablet Take 5 mg by mouth daily.      [provider]  VITAMIN D, ERGOCALCIFEROL, PO Take by mouth. 1 tab daily    [provider]  warfarin (COUMADIN) 5 MG tablet daily. Take 10 mg Monday, Friday. Take 7.5 mg all other days.    [provider]    Family History Family History  Problem Relation Age of Onset  . Stroke Mother 22    Social History Social History   Tobacco Use  . Smoking status: Never Smoker  . Smokeless tobacco:  Never Used  Substance Use Topics  . Alcohol use: No  . Drug use: No     Allergies   Patient has no known allergies.   Review of Systems Review of Systems  Constitutional: Negative for fever.  HENT: Negative for sore throat.   Eyes: Negative for pain.  Respiratory: Negative for shortness of breath.   Cardiovascular: Positive for near-syncope. Negative for chest pain.  Gastrointestinal: Negative for abdominal pain.  Genitourinary: Negative for dysuria.  Musculoskeletal: Negative for back pain.  Skin: Negative for rash.  Neurological: Negative for syncope and headaches.     Physical Exam Updated Vital Signs BP 119/62   Pulse 78   Temp (!) 97.4 F (36.3 C) (Oral)   Resp 16   SpO2 99%   Physical Exam Vitals signs and nursing note reviewed.  Constitutional:      Appearance: He is well-developed.  HENT:     Head:  Normocephalic and atraumatic.  Eyes:     Conjunctiva/sclera: Conjunctivae normal.  Neck:     Musculoskeletal: Neck supple.  Cardiovascular:     Rate and Rhythm: Normal rate. Rhythm irregular.     Pulses: Normal pulses.     Heart sounds: No murmur.  Pulmonary:     Effort: Pulmonary effort is normal. No respiratory distress.     Breath sounds: Normal breath sounds.  Abdominal:     Palpations: Abdomen is soft.     Tenderness: There is no abdominal tenderness.  Musculoskeletal: Normal range of motion.        General: No swelling, deformity or signs of injury.     Right lower leg: No edema.     Left lower leg: No edema.  Skin:    General: Skin is warm and dry.     Capillary Refill: Capillary refill takes less than 2 seconds.  Neurological:     General: No focal deficit present.     Mental Status: He is alert.      ED Treatments / Results  Labs (all labs ordered are listed, but only abnormal results are displayed) Labs Reviewed  BASIC METABOLIC PANEL - Abnormal; Notable for the following components:      Result Value   CO2 19 (*)    Glucose, Bld 215 (*)    BUN 25 (*)    Anion gap 17 (*)    All other components within normal limits  CBC WITH DIFFERENTIAL/PLATELET - Abnormal; Notable for the following components:   RBC 4.01 (*)    MCV 101.0 (*)    All other components within normal limits  PROTIME-INR - Abnormal; Notable for the following components:   Prothrombin Time 30.4 (*)    All other components within normal limits  I-STAT TROPONIN, ED    EKG EKG Interpretation  Date/Time:  Thursday August 15 2018 20:00:23 EST Ventricular Rate:  80 PR Interval:    QRS Duration: 111 QT Interval:  399 QTC Calculation: 461 R Axis:   93 Text Interpretation:  Atrial fibrillation Right axis deviation RSR' in V1 or V2, probably normal variant Nonspecific repol abnormality, diffuse leads Baseline wander in lead(s) V1 marked new ischemic changes lat and inferior compared with prior  1/04 Confirmed by Aletta Edouard [phone]) on 08/15/2018 8:08:32 PM   Radiology Dg Chest 2 View  Result Date: 08/15/2018 CLINICAL DATA:  Presyncope EXAM: CHEST - 2 VIEW COMPARISON:  09/16/2002 FINDINGS: Mild apical pleural thickening and calcification. Hyperinflated lungs. No focal airspace disease or effusion. Mild cardiomegaly with aortic atherosclerosis. No  pneumothorax. IMPRESSION: No active cardiopulmonary disease.  Borderline to mild cardiomegaly. Electronically Signed   By: Donavan Foil M.D.   On: 08/15/2018 21:19   Dg Lumbar Spine Complete  Result Date: 08/15/2018 CLINICAL DATA:  Presyncope, back pain EXAM: LUMBAR SPINE - COMPLETE 4+ VIEW COMPARISON:  CT 05/26/2010 FINDINGS: 6 mm retrolisthesis L2 on L3. Chronic moderate compression deformity of L1. Marked degenerative change at L2-L3 with disc space narrowing, endplate sclerosis and vacuum discs. Small anterior osteophytes at all levels of the lumbar spine. Moderate degenerative changes at L1-L2. IMPRESSION: 1. Chronic compression deformity at L1. 2. No acute osseous abnormality 3. Retrolisthesis L2 on L3 with marked degenerative change at L2-L3. Electronically Signed   By: Donavan Foil M.D.   On: 08/15/2018 21:22   Ct Head Wo Contrast  Result Date: 08/15/2018 CLINICAL DATA:  84 y/o  M; presyncope. EXAM: CT HEAD WITHOUT CONTRAST TECHNIQUE: Contiguous axial images were obtained from the base of the skull through the vertex without intravenous contrast. COMPARISON:  None. FINDINGS: Brain: No evidence of acute infarction, hemorrhage, hydrocephalus, extra-axial collection or mass lesion/mass effect. Few nonspecific white matter hypodensities are compatible with mild chronic microvascular ischemic changes. Mild volume loss of the brain. Vascular: Calcific atherosclerosis of the carotid siphons. No hyperdense vessel identified. Skull: Normal. Negative for fracture or focal lesion. Sinuses/Orbits: Mild paranasal sinus mucosal thickening. Normal  aeration of the mastoid air cells. Orbits are unremarkable. Other: None. IMPRESSION: 1. No acute intracranial abnormality. 2. Mild chronic microvascular ischemic changes and volume loss of the brain for age. Electronically Signed   By: Kristine Garbe M.D.   On: 08/15/2018 22:03    Procedures Procedures (including critical care time)  Medications Ordered in ED Medications - No data to display   Initial Impression / Assessment and Plan / ED Course  I have reviewed the triage vital signs and the nursing notes.  Pertinent labs & imaging results that were available during my care of the patient were reviewed by me and considered in my medical decision making (see chart for details).    Patient's symptoms improved here he was not orthostatic and was easily ambulatory in the department.  We reviewed that he is going to need to follow-up with his primary care doctor and should consider also following up with either neurosurgery or orthopedics regarding his paresthesias in his lower extremities.  He was instructed to return if any worsening symptoms.  Final Clinical Impressions(s) / ED Diagnoses   Final diagnoses:  Near syncope  Paresthesia of both lower extremities    ED Discharge Orders    None       Hayden Rasmussen, MD 08/15/18 2307

## 2018-08-15 NOTE — ED Notes (Signed)
Patient transported to X-ray 

## 2018-08-15 NOTE — ED Notes (Signed)
Pt walked to bathroom with no issues. SpO2 stayed above 96%.

## 2018-08-16 DIAGNOSIS — M48061 Spinal stenosis, lumbar region without neurogenic claudication: Secondary | ICD-10-CM | POA: Diagnosis not present

## 2018-08-20 DIAGNOSIS — E119 Type 2 diabetes mellitus without complications: Secondary | ICD-10-CM | POA: Diagnosis not present

## 2018-08-27 DIAGNOSIS — M5416 Radiculopathy, lumbar region: Secondary | ICD-10-CM | POA: Insufficient documentation

## 2018-09-02 DIAGNOSIS — Z7901 Long term (current) use of anticoagulants: Secondary | ICD-10-CM | POA: Diagnosis not present

## 2018-09-02 DIAGNOSIS — Z6823 Body mass index (BMI) 23.0-23.9, adult: Secondary | ICD-10-CM | POA: Diagnosis not present

## 2018-09-04 DIAGNOSIS — R69 Illness, unspecified: Secondary | ICD-10-CM | POA: Diagnosis not present

## 2018-09-06 DIAGNOSIS — M5416 Radiculopathy, lumbar region: Secondary | ICD-10-CM | POA: Diagnosis not present

## 2018-09-16 DIAGNOSIS — M810 Age-related osteoporosis without current pathological fracture: Secondary | ICD-10-CM | POA: Diagnosis not present

## 2018-10-07 DIAGNOSIS — Z6823 Body mass index (BMI) 23.0-23.9, adult: Secondary | ICD-10-CM | POA: Diagnosis not present

## 2018-10-07 DIAGNOSIS — Z7901 Long term (current) use of anticoagulants: Secondary | ICD-10-CM | POA: Diagnosis not present

## 2018-10-07 DIAGNOSIS — I480 Paroxysmal atrial fibrillation: Secondary | ICD-10-CM | POA: Diagnosis not present

## 2018-10-28 DIAGNOSIS — D485 Neoplasm of uncertain behavior of skin: Secondary | ICD-10-CM | POA: Diagnosis not present

## 2018-10-28 DIAGNOSIS — Z85828 Personal history of other malignant neoplasm of skin: Secondary | ICD-10-CM | POA: Diagnosis not present

## 2018-10-28 DIAGNOSIS — L853 Xerosis cutis: Secondary | ICD-10-CM | POA: Diagnosis not present

## 2018-10-28 DIAGNOSIS — L812 Freckles: Secondary | ICD-10-CM | POA: Diagnosis not present

## 2018-10-28 DIAGNOSIS — L821 Other seborrheic keratosis: Secondary | ICD-10-CM | POA: Diagnosis not present

## 2018-10-28 DIAGNOSIS — D1801 Hemangioma of skin and subcutaneous tissue: Secondary | ICD-10-CM | POA: Diagnosis not present

## 2018-10-28 DIAGNOSIS — L570 Actinic keratosis: Secondary | ICD-10-CM | POA: Diagnosis not present

## 2018-10-28 DIAGNOSIS — L820 Inflamed seborrheic keratosis: Secondary | ICD-10-CM | POA: Diagnosis not present

## 2018-10-31 DIAGNOSIS — M25551 Pain in right hip: Secondary | ICD-10-CM | POA: Diagnosis not present

## 2018-10-31 DIAGNOSIS — M48061 Spinal stenosis, lumbar region without neurogenic claudication: Secondary | ICD-10-CM | POA: Diagnosis not present

## 2018-11-08 DIAGNOSIS — I480 Paroxysmal atrial fibrillation: Secondary | ICD-10-CM | POA: Diagnosis not present

## 2018-11-08 DIAGNOSIS — M5416 Radiculopathy, lumbar region: Secondary | ICD-10-CM | POA: Diagnosis not present

## 2018-11-08 DIAGNOSIS — Z7901 Long term (current) use of anticoagulants: Secondary | ICD-10-CM | POA: Diagnosis not present

## 2018-11-08 DIAGNOSIS — M25551 Pain in right hip: Secondary | ICD-10-CM | POA: Diagnosis not present

## 2018-12-10 DIAGNOSIS — R69 Illness, unspecified: Secondary | ICD-10-CM | POA: Diagnosis not present

## 2018-12-16 DIAGNOSIS — Z7901 Long term (current) use of anticoagulants: Secondary | ICD-10-CM | POA: Diagnosis not present

## 2018-12-16 DIAGNOSIS — I480 Paroxysmal atrial fibrillation: Secondary | ICD-10-CM | POA: Diagnosis not present

## 2019-01-02 DIAGNOSIS — Z79899 Other long term (current) drug therapy: Secondary | ICD-10-CM | POA: Diagnosis not present

## 2019-01-02 DIAGNOSIS — M810 Age-related osteoporosis without current pathological fracture: Secondary | ICD-10-CM | POA: Diagnosis not present

## 2019-01-02 DIAGNOSIS — Z7901 Long term (current) use of anticoagulants: Secondary | ICD-10-CM | POA: Diagnosis not present

## 2019-01-02 DIAGNOSIS — I480 Paroxysmal atrial fibrillation: Secondary | ICD-10-CM | POA: Diagnosis not present

## 2019-01-13 DIAGNOSIS — E119 Type 2 diabetes mellitus without complications: Secondary | ICD-10-CM | POA: Diagnosis not present

## 2019-01-17 ENCOUNTER — Other Ambulatory Visit: Payer: Self-pay

## 2019-01-17 ENCOUNTER — Other Ambulatory Visit (HOSPITAL_COMMUNITY): Payer: Self-pay | Admitting: *Deleted

## 2019-01-21 ENCOUNTER — Ambulatory Visit (HOSPITAL_COMMUNITY)
Admission: RE | Admit: 2019-01-21 | Discharge: 2019-01-21 | Disposition: A | Discharge status: Home / Self Care | Payer: Medicare HMO | Source: Ambulatory Visit | Attending: Internal Medicine | Admitting: Internal Medicine

## 2019-01-21 ENCOUNTER — Other Ambulatory Visit: Payer: Self-pay

## 2019-01-21 DIAGNOSIS — M810 Age-related osteoporosis without current pathological fracture: Secondary | ICD-10-CM | POA: Diagnosis present

## 2019-01-21 MED ORDER — ZOLEDRONIC ACID 5 MG/100ML IV SOLN
5.0000 mg | Freq: Once | INTRAVENOUS | Status: AC
  Administered 2019-01-21: 13:00:00 5 mg via INTRAVENOUS

## 2019-01-21 MED ORDER — ZOLEDRONIC ACID 5 MG/100ML IV SOLN
INTRAVENOUS | Status: AC
  Administered 2019-01-21: 5 mg via INTRAVENOUS
  Filled 2019-01-21: qty 100

## 2019-01-30 DIAGNOSIS — E1140 Type 2 diabetes mellitus with diabetic neuropathy, unspecified: Secondary | ICD-10-CM | POA: Diagnosis not present

## 2019-01-30 DIAGNOSIS — Z125 Encounter for screening for malignant neoplasm of prostate: Secondary | ICD-10-CM | POA: Diagnosis not present

## 2019-01-30 DIAGNOSIS — M810 Age-related osteoporosis without current pathological fracture: Secondary | ICD-10-CM | POA: Diagnosis not present

## 2019-01-30 DIAGNOSIS — Z7901 Long term (current) use of anticoagulants: Secondary | ICD-10-CM | POA: Diagnosis not present

## 2019-01-30 DIAGNOSIS — E7849 Other hyperlipidemia: Secondary | ICD-10-CM | POA: Diagnosis not present

## 2019-01-30 DIAGNOSIS — I10 Essential (primary) hypertension: Secondary | ICD-10-CM | POA: Diagnosis not present

## 2019-01-31 DIAGNOSIS — I10 Essential (primary) hypertension: Secondary | ICD-10-CM | POA: Diagnosis not present

## 2019-01-31 DIAGNOSIS — R82998 Other abnormal findings in urine: Secondary | ICD-10-CM | POA: Diagnosis not present

## 2019-02-03 DIAGNOSIS — R69 Illness, unspecified: Secondary | ICD-10-CM | POA: Diagnosis not present

## 2019-02-06 DIAGNOSIS — H40013 Open angle with borderline findings, low risk, bilateral: Secondary | ICD-10-CM | POA: Diagnosis not present

## 2019-02-06 DIAGNOSIS — M810 Age-related osteoporosis without current pathological fracture: Secondary | ICD-10-CM | POA: Diagnosis not present

## 2019-02-06 DIAGNOSIS — E785 Hyperlipidemia, unspecified: Secondary | ICD-10-CM | POA: Diagnosis not present

## 2019-02-06 DIAGNOSIS — I10 Essential (primary) hypertension: Secondary | ICD-10-CM | POA: Diagnosis not present

## 2019-02-06 DIAGNOSIS — Z0000 Encounter for general adult medical examination without abnormal findings: Secondary | ICD-10-CM | POA: Diagnosis not present

## 2019-02-06 DIAGNOSIS — H25013 Cortical age-related cataract, bilateral: Secondary | ICD-10-CM | POA: Diagnosis not present

## 2019-02-06 DIAGNOSIS — E1129 Type 2 diabetes mellitus with other diabetic kidney complication: Secondary | ICD-10-CM | POA: Diagnosis not present

## 2019-02-06 DIAGNOSIS — D696 Thrombocytopenia, unspecified: Secondary | ICD-10-CM | POA: Diagnosis not present

## 2019-02-06 DIAGNOSIS — E119 Type 2 diabetes mellitus without complications: Secondary | ICD-10-CM | POA: Diagnosis not present

## 2019-02-06 DIAGNOSIS — I480 Paroxysmal atrial fibrillation: Secondary | ICD-10-CM | POA: Diagnosis not present

## 2019-02-06 DIAGNOSIS — Z8546 Personal history of malignant neoplasm of prostate: Secondary | ICD-10-CM | POA: Diagnosis not present

## 2019-02-06 DIAGNOSIS — Z7901 Long term (current) use of anticoagulants: Secondary | ICD-10-CM | POA: Diagnosis not present

## 2019-02-06 DIAGNOSIS — E1140 Type 2 diabetes mellitus with diabetic neuropathy, unspecified: Secondary | ICD-10-CM | POA: Diagnosis not present

## 2019-02-06 DIAGNOSIS — H2513 Age-related nuclear cataract, bilateral: Secondary | ICD-10-CM | POA: Diagnosis not present

## 2019-02-24 DIAGNOSIS — I480 Paroxysmal atrial fibrillation: Secondary | ICD-10-CM | POA: Diagnosis not present

## 2019-02-24 DIAGNOSIS — Z7901 Long term (current) use of anticoagulants: Secondary | ICD-10-CM | POA: Diagnosis not present

## 2019-03-27 DIAGNOSIS — Z7901 Long term (current) use of anticoagulants: Secondary | ICD-10-CM | POA: Diagnosis not present

## 2019-03-27 DIAGNOSIS — I480 Paroxysmal atrial fibrillation: Secondary | ICD-10-CM | POA: Diagnosis not present

## 2019-04-17 DIAGNOSIS — E119 Type 2 diabetes mellitus without complications: Secondary | ICD-10-CM | POA: Diagnosis not present

## 2019-04-25 DIAGNOSIS — Z23 Encounter for immunization: Secondary | ICD-10-CM | POA: Diagnosis not present

## 2019-04-29 DIAGNOSIS — I480 Paroxysmal atrial fibrillation: Secondary | ICD-10-CM | POA: Diagnosis not present

## 2019-04-29 DIAGNOSIS — Z7901 Long term (current) use of anticoagulants: Secondary | ICD-10-CM | POA: Diagnosis not present

## 2019-05-12 DIAGNOSIS — D485 Neoplasm of uncertain behavior of skin: Secondary | ICD-10-CM | POA: Diagnosis not present

## 2019-05-12 DIAGNOSIS — L821 Other seborrheic keratosis: Secondary | ICD-10-CM | POA: Diagnosis not present

## 2019-05-12 DIAGNOSIS — L570 Actinic keratosis: Secondary | ICD-10-CM | POA: Diagnosis not present

## 2019-05-12 DIAGNOSIS — D2371 Other benign neoplasm of skin of right lower limb, including hip: Secondary | ICD-10-CM | POA: Diagnosis not present

## 2019-05-12 DIAGNOSIS — D3613 Benign neoplasm of peripheral nerves and autonomic nervous system of lower limb, including hip: Secondary | ICD-10-CM | POA: Diagnosis not present

## 2019-05-12 DIAGNOSIS — D1801 Hemangioma of skin and subcutaneous tissue: Secondary | ICD-10-CM | POA: Diagnosis not present

## 2019-05-12 DIAGNOSIS — Z85828 Personal history of other malignant neoplasm of skin: Secondary | ICD-10-CM | POA: Diagnosis not present

## 2019-05-12 DIAGNOSIS — L812 Freckles: Secondary | ICD-10-CM | POA: Diagnosis not present

## 2019-06-04 DIAGNOSIS — Z7901 Long term (current) use of anticoagulants: Secondary | ICD-10-CM | POA: Diagnosis not present

## 2019-06-04 DIAGNOSIS — I480 Paroxysmal atrial fibrillation: Secondary | ICD-10-CM | POA: Diagnosis not present

## 2019-06-12 ENCOUNTER — Ambulatory Visit: Payer: Medicare HMO | Admitting: Cardiology

## 2019-06-12 ENCOUNTER — Telehealth: Payer: Medicare HMO | Admitting: Cardiology

## 2019-06-16 DIAGNOSIS — C61 Malignant neoplasm of prostate: Secondary | ICD-10-CM | POA: Diagnosis not present

## 2019-06-23 DIAGNOSIS — E291 Testicular hypofunction: Secondary | ICD-10-CM | POA: Diagnosis not present

## 2019-06-23 DIAGNOSIS — C61 Malignant neoplasm of prostate: Secondary | ICD-10-CM | POA: Diagnosis not present

## 2019-06-23 DIAGNOSIS — R351 Nocturia: Secondary | ICD-10-CM | POA: Diagnosis not present

## 2019-07-01 DIAGNOSIS — E291 Testicular hypofunction: Secondary | ICD-10-CM | POA: Diagnosis not present

## 2019-07-03 ENCOUNTER — Ambulatory Visit: Payer: Medicare HMO | Admitting: Medical

## 2019-07-03 ENCOUNTER — Other Ambulatory Visit: Payer: Self-pay

## 2019-07-03 ENCOUNTER — Encounter: Payer: Self-pay | Admitting: Cardiology

## 2019-07-03 ENCOUNTER — Ambulatory Visit: Payer: Medicare HMO | Admitting: Cardiology

## 2019-07-03 VITALS — BP 118/58 | HR 69 | Ht 73.5 in | Wt 174.0 lb

## 2019-07-03 DIAGNOSIS — I482 Chronic atrial fibrillation, unspecified: Secondary | ICD-10-CM | POA: Diagnosis not present

## 2019-07-03 DIAGNOSIS — Z7189 Other specified counseling: Secondary | ICD-10-CM | POA: Diagnosis not present

## 2019-07-03 DIAGNOSIS — R5383 Other fatigue: Secondary | ICD-10-CM

## 2019-07-03 DIAGNOSIS — R011 Cardiac murmur, unspecified: Secondary | ICD-10-CM

## 2019-07-03 DIAGNOSIS — Z7901 Long term (current) use of anticoagulants: Secondary | ICD-10-CM

## 2019-07-03 NOTE — Patient Instructions (Signed)
Medication Instructions:  Your Physician recommend you continue on your current medication as directed.     *If you need a refill on your cardiac medications before your next appointment, please call your pharmacy*  Lab Work: None  Testing/Procedures: Your physician has requested that you have an echocardiogram. Echocardiography is a painless test that uses sound waves to create images of your heart. It provides your doctor with information about the size and shape of your heart and how well your heart's chambers and valves are working. This procedure takes approximately one hour. There are no restrictions for this procedure. [place] [address]   Follow-Up: At [facility], you and your health needs are our priority.  As part of our continuing mission to provide you with exceptional heart care, we have created designated Provider Care Teams.  These Care Teams include your primary Cardiologist (physician) and Advanced Practice Providers (APPs -  Physician Assistants and Nurse Practitioners) who all work together to provide you with the care you need, when you need it.  Your next appointment:   12 months  The format for your next appointment:   In Person  Provider:   Peter Martinique, MD

## 2019-07-03 NOTE — Progress Notes (Signed)
Cardiology Office Note:    Date:  07/03/2019   ID:  Henry Holland, DOB 06-07-1933, MRN [id number]  PCP:  Marton Redwood, MD  Cardiologist:  Peter Martinique, MD  Referring MD: Marton Redwood, MD   CC: follow up  History of Present Illness:    Henry Holland is a 85 y.o. male with a hx of permanent atrial fibrillation on coumadin, hypertension who is seen for follow up today. He is a patient of Dr. Martinique.  Today: -Several weeks ago, his energy level was down. Noted at one point that heart rate was around 46 briefly. Called PCP, told to watch his HR and BP, and HR improved without intervention. -saw his urologist recently, told him about low energy level. Recommended to try testosterone shots, just had first one. Too early to tell if helping.   Atrial fibrillation: permanent, since 2002. Tolerating coumadin well, no significant bruising or bleeding. Has discussed DOACs with Dr. Brigitte Pulse and Dr. Martinique in the past. We reviewed today as well, pros/cons. Shared decision making, we both agreed that given how well he is doing, it is reasonable to continue coumadin. If/when apixaban becomes generic, he would reconsider DOAC.  Episode of bradycardia: single limited event, has not had a similar issue in 4-5 years. Exercises daily. No limitations or issues. Can get HR to 120 or 130 with exercise, then settles down to 60s when resting. No syncope. No presyncope. Has tolerated low dose diltiazem for many years. Instructed on what to watch for.   Denies chest pain, shortness of breath at rest or with normal exertion. No PND, orthopnea, LE edema or unexpected weight gain. No syncope or palpitations.  Past Medical History:  Diagnosis Date  . Arthritis   . Chronic atrial fibrillation ([place])   . Dysrhythmia   . History of radiation therapy   . Hyperlipidemia   . Hypertension   . Prostate cancer [facility])     Past Surgical History:  Procedure Laterality Date  . HERNIA REPAIR     X2    Current Medications:  Current Outpatient Medications on File Prior to Visit  Medication Sig  . Ascorbic Acid (VITA-C PO) Take by mouth.  . Calcium Citrate (CITRACAL PO) Take 600 mg by mouth 2 (two) times daily.    Marland Kitchen diltiazem (CARDIZEM CD) 120 MG 24 hr capsule Take 1 capsule (120 mg total) by mouth daily.  Marland Kitchen losartan (COZAAR) 100 MG tablet Take 100 mg by mouth daily.    . rosuvastatin (CRESTOR) 5 MG tablet Take 5 mg by mouth daily.    Marland Kitchen VITAMIN D, ERGOCALCIFEROL, PO Take 1 tablet by mouth daily.   Marland Kitchen warfarin (COUMADIN) 5 MG tablet daily. Take 10 mg Monday, Friday. Take 7.5 mg all other days.   No current facility-administered medications on file prior to visit.      Allergies:   Patient has no known allergies.   Social History   Tobacco Use  . Smoking status: Never Smoker  . Smokeless tobacco: Never Used  Substance Use Topics  . Alcohol use: No  . Drug use: No    Family History: family history includes Stroke (age of onset: 25) in his mother.  ROS:   Please see the history of present illness.  Additional pertinent ROS: Constitutional: Negative for chills, fever, night sweats, unintentional weight loss  HENT: Negative for ear pain and hearing loss.   Eyes: Negative for loss of vision and eye pain.  Respiratory: Negative for cough, sputum, wheezing.  Cardiovascular: See HPI. Gastrointestinal: Negative for abdominal pain, melena, and hematochezia.  Genitourinary: Negative for dysuria and hematuria.  Musculoskeletal: Negative for falls and myalgias.  Skin: Negative for itching and rash.  Neurological: Negative for focal weakness, focal sensory changes and loss of consciousness.  Endo/Heme/Allergies: Does bruise/bleed easily, though not a problem for him.     EKGs/Labs/Other Studies Reviewed:    The following studies were reviewed today: No echo since 2003  EKG:  EKG is personally reviewed.  The ekg ordered today demonstrates atrial fibrillation, iRBBB, nonspecific ST changes, HR 69 bpm   Recent Labs: 08/15/2018: BUN 25; Creatinine, Ser 0.93; Hemoglobin 13.5; Platelets 151; Potassium 4.1; Sodium 136  Recent Lipid Panel    Component Value Date/Time   CHOL 125 01/02/2011 0946   TRIG 121.0 01/02/2011 0946   HDL 37.40 (L) 01/02/2011 0946   CHOLHDL 3 01/02/2011 0946   VLDL 24.2 01/02/2011 0946   LDLCALC 63 01/02/2011 0946    Physical Exam:    VS:  BP (!) 118/58   Pulse 69   Ht 6' 1.5" (1.867 m)   Wt 174 lb (78.9 kg)   SpO2 99%   BMI 22.65 kg/m     Wt Readings from Last 3 Encounters:  07/03/19 174 lb (78.9 kg)  05/20/18 174 lb 3.2 oz (79 kg)  11/26/17 177 lb 4 oz (80.4 kg)    GEN: Well nourished, well developed in no acute distress HEENT: Normal, moist mucous membranes NECK: No JVD CARDIAC: regular rhythm, normal S1 and S2, no rubs or gallops. 2/6 harsh SM, not previously documented. VASCULAR: Radial and DP pulses 2+ bilaterally. No carotid bruits RESPIRATORY:  Clear to auscultation without rales, wheezing or rhonchi  ABDOMEN: Soft, non-tender, non-distended MUSCULOSKELETAL:  Ambulates independently SKIN: Warm and dry, no edema NEUROLOGIC:  Alert and oriented x 3. No focal neuro deficits noted. PSYCHIATRIC:  Normal affect    ASSESSMENT:    1. Murmur, cardiac   2. Fatigue, unspecified type   3. Chronic atrial fibrillation ([place])   4. Current use of long term anticoagulation   5. Cardiac risk counseling   6. Counseling on health promotion and disease prevention    PLAN:    Murmur: harsh, systolic, best heard at LUSB but audible throughout precordium. Not previously documented, no recent echo. With new fatigue, will rule out structural issue as potential cause of his fatigue. -echocardiogram  Permanent atrial fibrillation: rate controlled on diltiazem, long term anticoagulation with coumadin -CHA2DS2/VAS Stroke Risk Points=4. Discussed pros/cons of changing to DOAC today, we both agreed that continuing coumadin at this time is reasonable given how well he  has done with it. -single episode of bradycardia, self limited. Can augment HR with exercise appropriately. No bradycardia today. Instructed on red flag warning signs, but otherwise would continue current medications.  Cardiac risk counseling and prevention recommendations: -recommend heart healthy/Mediterranean diet, with whole grains, fruits, vegetable, fish, lean meats, nuts, and olive oil. Limit salt. -recommend moderate walking, 3-5 times/week for 30-50 minutes each session. Aim for at least 150 minutes.week. Goal should be pace of 3 miles/hours, or walking 1.5 miles in 30 minutes -recommend avoidance of tobacco products. Avoid excess alcohol.  Plan for follow up: 1 year or sooner PRN with Dr. Martinique  Medication Adjustments/Labs and Tests Ordered: Current medicines are reviewed at length with the patient today.  Concerns regarding medicines are outlined above.  Orders Placed This Encounter  Procedures  . EKG 12-Lead  . ECHOCARDIOGRAM COMPLETE   No orders of  the defined types were placed in this encounter.   Patient Instructions  Medication Instructions:  Your Physician recommend you continue on your current medication as directed.     *If you need a refill on your cardiac medications before your next appointment, please call your pharmacy*  Lab Work: None  Testing/Procedures: Your physician has requested that you have an echocardiogram. Echocardiography is a painless test that uses sound waves to create images of your heart. It provides your doctor with information about the size and shape of your heart and how well your heart's chambers and valves are working. This procedure takes approximately one hour. There are no restrictions for this procedure. [place] [address]   Follow-Up: At [facility], you and your health needs are our priority.  As part of our continuing mission to provide you with exceptional heart care, we have created designated Provider Care  Teams.  These Care Teams include your primary Cardiologist (physician) and Advanced Practice Providers (APPs -  Physician Assistants and Nurse Practitioners) who all work together to provide you with the care you need, when you need it.  Your next appointment:   12 months  The format for your next appointment:   In Person  Provider:   Peter Martinique, MD     Signed, Buford Dresser, MD PhD 07/03/2019 5:21 PM    [place]

## 2019-07-07 ENCOUNTER — Ambulatory Visit (HOSPITAL_COMMUNITY): Payer: Medicare HMO | Attending: Internal Medicine

## 2019-07-07 ENCOUNTER — Other Ambulatory Visit: Payer: Self-pay

## 2019-07-07 DIAGNOSIS — R011 Cardiac murmur, unspecified: Secondary | ICD-10-CM

## 2019-07-07 DIAGNOSIS — R5383 Other fatigue: Secondary | ICD-10-CM | POA: Diagnosis not present

## 2019-07-08 DIAGNOSIS — I480 Paroxysmal atrial fibrillation: Secondary | ICD-10-CM | POA: Diagnosis not present

## 2019-07-08 DIAGNOSIS — Z7901 Long term (current) use of anticoagulants: Secondary | ICD-10-CM | POA: Diagnosis not present

## 2019-07-17 DIAGNOSIS — E291 Testicular hypofunction: Secondary | ICD-10-CM | POA: Diagnosis not present

## 2019-08-06 DIAGNOSIS — Z7901 Long term (current) use of anticoagulants: Secondary | ICD-10-CM | POA: Diagnosis not present

## 2019-08-06 DIAGNOSIS — E1140 Type 2 diabetes mellitus with diabetic neuropathy, unspecified: Secondary | ICD-10-CM | POA: Diagnosis not present

## 2019-08-06 DIAGNOSIS — I480 Paroxysmal atrial fibrillation: Secondary | ICD-10-CM | POA: Diagnosis not present

## 2019-08-11 DIAGNOSIS — Z8546 Personal history of malignant neoplasm of prostate: Secondary | ICD-10-CM | POA: Diagnosis not present

## 2019-08-11 DIAGNOSIS — I480 Paroxysmal atrial fibrillation: Secondary | ICD-10-CM | POA: Diagnosis not present

## 2019-08-11 DIAGNOSIS — R5383 Other fatigue: Secondary | ICD-10-CM | POA: Diagnosis not present

## 2019-08-11 DIAGNOSIS — E785 Hyperlipidemia, unspecified: Secondary | ICD-10-CM | POA: Diagnosis not present

## 2019-08-11 DIAGNOSIS — E1140 Type 2 diabetes mellitus with diabetic neuropathy, unspecified: Secondary | ICD-10-CM | POA: Diagnosis not present

## 2019-08-11 DIAGNOSIS — I340 Nonrheumatic mitral (valve) insufficiency: Secondary | ICD-10-CM | POA: Diagnosis not present

## 2019-08-11 DIAGNOSIS — I10 Essential (primary) hypertension: Secondary | ICD-10-CM | POA: Diagnosis not present

## 2019-08-11 DIAGNOSIS — E1129 Type 2 diabetes mellitus with other diabetic kidney complication: Secondary | ICD-10-CM | POA: Diagnosis not present

## 2019-08-11 DIAGNOSIS — D6869 Other thrombophilia: Secondary | ICD-10-CM | POA: Diagnosis not present

## 2019-08-21 DIAGNOSIS — E119 Type 2 diabetes mellitus without complications: Secondary | ICD-10-CM | POA: Diagnosis not present

## 2019-09-18 DIAGNOSIS — Z7901 Long term (current) use of anticoagulants: Secondary | ICD-10-CM | POA: Diagnosis not present

## 2019-09-18 DIAGNOSIS — D6869 Other thrombophilia: Secondary | ICD-10-CM | POA: Diagnosis not present

## 2019-09-18 DIAGNOSIS — I480 Paroxysmal atrial fibrillation: Secondary | ICD-10-CM | POA: Diagnosis not present

## 2019-10-10 DIAGNOSIS — C61 Malignant neoplasm of prostate: Secondary | ICD-10-CM | POA: Diagnosis not present

## 2019-10-21 DIAGNOSIS — R69 Illness, unspecified: Secondary | ICD-10-CM | POA: Diagnosis not present

## 2019-10-29 DIAGNOSIS — I480 Paroxysmal atrial fibrillation: Secondary | ICD-10-CM | POA: Diagnosis not present

## 2019-10-29 DIAGNOSIS — D6869 Other thrombophilia: Secondary | ICD-10-CM | POA: Diagnosis not present

## 2019-11-10 DIAGNOSIS — L821 Other seborrheic keratosis: Secondary | ICD-10-CM | POA: Diagnosis not present

## 2019-11-10 DIAGNOSIS — L820 Inflamed seborrheic keratosis: Secondary | ICD-10-CM | POA: Diagnosis not present

## 2019-11-10 DIAGNOSIS — L812 Freckles: Secondary | ICD-10-CM | POA: Diagnosis not present

## 2019-11-10 DIAGNOSIS — L570 Actinic keratosis: Secondary | ICD-10-CM | POA: Diagnosis not present

## 2019-11-10 DIAGNOSIS — D1801 Hemangioma of skin and subcutaneous tissue: Secondary | ICD-10-CM | POA: Diagnosis not present

## 2019-11-10 DIAGNOSIS — Z85828 Personal history of other malignant neoplasm of skin: Secondary | ICD-10-CM | POA: Diagnosis not present

## 2019-12-04 DIAGNOSIS — D6869 Other thrombophilia: Secondary | ICD-10-CM | POA: Diagnosis not present

## 2019-12-04 DIAGNOSIS — I480 Paroxysmal atrial fibrillation: Secondary | ICD-10-CM | POA: Diagnosis not present

## 2019-12-05 DIAGNOSIS — C61 Malignant neoplasm of prostate: Secondary | ICD-10-CM | POA: Diagnosis not present

## 2019-12-05 DIAGNOSIS — E291 Testicular hypofunction: Secondary | ICD-10-CM | POA: Diagnosis not present

## 2019-12-26 DIAGNOSIS — I10 Essential (primary) hypertension: Secondary | ICD-10-CM | POA: Diagnosis not present

## 2019-12-26 DIAGNOSIS — M1990 Unspecified osteoarthritis, unspecified site: Secondary | ICD-10-CM | POA: Diagnosis not present

## 2019-12-26 DIAGNOSIS — R32 Unspecified urinary incontinence: Secondary | ICD-10-CM | POA: Diagnosis not present

## 2019-12-26 DIAGNOSIS — N529 Male erectile dysfunction, unspecified: Secondary | ICD-10-CM | POA: Diagnosis not present

## 2019-12-26 DIAGNOSIS — Z008 Encounter for other general examination: Secondary | ICD-10-CM | POA: Diagnosis not present

## 2019-12-26 DIAGNOSIS — D6869 Other thrombophilia: Secondary | ICD-10-CM | POA: Diagnosis not present

## 2019-12-26 DIAGNOSIS — E785 Hyperlipidemia, unspecified: Secondary | ICD-10-CM | POA: Diagnosis not present

## 2019-12-26 DIAGNOSIS — Z7901 Long term (current) use of anticoagulants: Secondary | ICD-10-CM | POA: Diagnosis not present

## 2019-12-26 DIAGNOSIS — I739 Peripheral vascular disease, unspecified: Secondary | ICD-10-CM | POA: Diagnosis not present

## 2019-12-26 DIAGNOSIS — Z809 Family history of malignant neoplasm, unspecified: Secondary | ICD-10-CM | POA: Diagnosis not present

## 2019-12-26 DIAGNOSIS — I4891 Unspecified atrial fibrillation: Secondary | ICD-10-CM | POA: Diagnosis not present

## 2020-01-08 DIAGNOSIS — D6869 Other thrombophilia: Secondary | ICD-10-CM | POA: Diagnosis not present

## 2020-01-08 DIAGNOSIS — I480 Paroxysmal atrial fibrillation: Secondary | ICD-10-CM | POA: Diagnosis not present

## 2020-01-21 DIAGNOSIS — E119 Type 2 diabetes mellitus without complications: Secondary | ICD-10-CM | POA: Diagnosis not present

## 2020-02-10 DIAGNOSIS — N529 Male erectile dysfunction, unspecified: Secondary | ICD-10-CM | POA: Diagnosis not present

## 2020-02-10 DIAGNOSIS — H40013 Open angle with borderline findings, low risk, bilateral: Secondary | ICD-10-CM | POA: Diagnosis not present

## 2020-02-10 DIAGNOSIS — H2513 Age-related nuclear cataract, bilateral: Secondary | ICD-10-CM | POA: Diagnosis not present

## 2020-02-10 DIAGNOSIS — Z125 Encounter for screening for malignant neoplasm of prostate: Secondary | ICD-10-CM | POA: Diagnosis not present

## 2020-02-10 DIAGNOSIS — E1140 Type 2 diabetes mellitus with diabetic neuropathy, unspecified: Secondary | ICD-10-CM | POA: Diagnosis not present

## 2020-02-10 DIAGNOSIS — Z0000 Encounter for general adult medical examination without abnormal findings: Secondary | ICD-10-CM | POA: Diagnosis not present

## 2020-02-10 DIAGNOSIS — M810 Age-related osteoporosis without current pathological fracture: Secondary | ICD-10-CM | POA: Diagnosis not present

## 2020-02-10 DIAGNOSIS — E119 Type 2 diabetes mellitus without complications: Secondary | ICD-10-CM | POA: Diagnosis not present

## 2020-02-10 DIAGNOSIS — E7849 Other hyperlipidemia: Secondary | ICD-10-CM | POA: Diagnosis not present

## 2020-02-10 DIAGNOSIS — H35363 Drusen (degenerative) of macula, bilateral: Secondary | ICD-10-CM | POA: Diagnosis not present

## 2020-02-17 DIAGNOSIS — R82998 Other abnormal findings in urine: Secondary | ICD-10-CM | POA: Diagnosis not present

## 2020-02-17 DIAGNOSIS — Z0000 Encounter for general adult medical examination without abnormal findings: Secondary | ICD-10-CM | POA: Diagnosis not present

## 2020-02-17 DIAGNOSIS — Z8546 Personal history of malignant neoplasm of prostate: Secondary | ICD-10-CM | POA: Diagnosis not present

## 2020-02-17 DIAGNOSIS — I340 Nonrheumatic mitral (valve) insufficiency: Secondary | ICD-10-CM | POA: Diagnosis not present

## 2020-02-17 DIAGNOSIS — E291 Testicular hypofunction: Secondary | ICD-10-CM | POA: Diagnosis not present

## 2020-02-17 DIAGNOSIS — Z1331 Encounter for screening for depression: Secondary | ICD-10-CM | POA: Diagnosis not present

## 2020-02-17 DIAGNOSIS — I10 Essential (primary) hypertension: Secondary | ICD-10-CM | POA: Diagnosis not present

## 2020-02-17 DIAGNOSIS — M810 Age-related osteoporosis without current pathological fracture: Secondary | ICD-10-CM | POA: Diagnosis not present

## 2020-02-17 DIAGNOSIS — I480 Paroxysmal atrial fibrillation: Secondary | ICD-10-CM | POA: Diagnosis not present

## 2020-02-17 DIAGNOSIS — E1140 Type 2 diabetes mellitus with diabetic neuropathy, unspecified: Secondary | ICD-10-CM | POA: Diagnosis not present

## 2020-02-17 DIAGNOSIS — E785 Hyperlipidemia, unspecified: Secondary | ICD-10-CM | POA: Diagnosis not present

## 2020-02-17 DIAGNOSIS — E1129 Type 2 diabetes mellitus with other diabetic kidney complication: Secondary | ICD-10-CM | POA: Diagnosis not present

## 2020-03-04 DIAGNOSIS — D6869 Other thrombophilia: Secondary | ICD-10-CM | POA: Diagnosis not present

## 2020-03-04 DIAGNOSIS — M810 Age-related osteoporosis without current pathological fracture: Secondary | ICD-10-CM | POA: Diagnosis not present

## 2020-03-04 DIAGNOSIS — I480 Paroxysmal atrial fibrillation: Secondary | ICD-10-CM | POA: Diagnosis not present

## 2020-03-09 ENCOUNTER — Other Ambulatory Visit (HOSPITAL_COMMUNITY): Payer: Self-pay | Admitting: *Deleted

## 2020-03-10 ENCOUNTER — Other Ambulatory Visit: Payer: Self-pay

## 2020-03-10 ENCOUNTER — Ambulatory Visit (HOSPITAL_COMMUNITY)
Admission: RE | Admit: 2020-03-10 | Discharge: 2020-03-10 | Disposition: A | Discharge status: Home / Self Care | Payer: Medicare HMO | Source: Ambulatory Visit | Attending: Internal Medicine | Admitting: Internal Medicine

## 2020-03-10 DIAGNOSIS — M810 Age-related osteoporosis without current pathological fracture: Secondary | ICD-10-CM | POA: Diagnosis present

## 2020-03-10 MED ORDER — ZOLEDRONIC ACID 5 MG/100ML IV SOLN: 5.0000 mg | Freq: Once | INTRAVENOUS | Status: AC

## 2020-03-10 MED ORDER — ZOLEDRONIC ACID 5 MG/100ML IV SOLN
INTRAVENOUS | Status: AC
  Administered 2020-03-10: 5 mg via INTRAVENOUS
  Filled 2020-03-10: qty 100

## 2020-03-15 ENCOUNTER — Encounter (HOSPITAL_COMMUNITY): Payer: Medicare HMO

## 2020-04-06 DIAGNOSIS — D6869 Other thrombophilia: Secondary | ICD-10-CM | POA: Diagnosis not present

## 2020-04-06 DIAGNOSIS — Z7901 Long term (current) use of anticoagulants: Secondary | ICD-10-CM | POA: Diagnosis not present

## 2020-04-06 DIAGNOSIS — I480 Paroxysmal atrial fibrillation: Secondary | ICD-10-CM | POA: Diagnosis not present

## 2020-04-20 DIAGNOSIS — R69 Illness, unspecified: Secondary | ICD-10-CM | POA: Diagnosis not present

## 2020-05-04 DIAGNOSIS — Z7901 Long term (current) use of anticoagulants: Secondary | ICD-10-CM | POA: Diagnosis not present

## 2020-05-04 DIAGNOSIS — I480 Paroxysmal atrial fibrillation: Secondary | ICD-10-CM | POA: Diagnosis not present

## 2020-05-04 DIAGNOSIS — D6869 Other thrombophilia: Secondary | ICD-10-CM | POA: Diagnosis not present

## 2020-05-26 DIAGNOSIS — D1801 Hemangioma of skin and subcutaneous tissue: Secondary | ICD-10-CM | POA: Diagnosis not present

## 2020-05-26 DIAGNOSIS — L570 Actinic keratosis: Secondary | ICD-10-CM | POA: Diagnosis not present

## 2020-05-26 DIAGNOSIS — D225 Melanocytic nevi of trunk: Secondary | ICD-10-CM | POA: Diagnosis not present

## 2020-05-26 DIAGNOSIS — Z85828 Personal history of other malignant neoplasm of skin: Secondary | ICD-10-CM | POA: Diagnosis not present

## 2020-05-26 DIAGNOSIS — D485 Neoplasm of uncertain behavior of skin: Secondary | ICD-10-CM | POA: Diagnosis not present

## 2020-05-26 DIAGNOSIS — L821 Other seborrheic keratosis: Secondary | ICD-10-CM | POA: Diagnosis not present

## 2020-05-26 DIAGNOSIS — D692 Other nonthrombocytopenic purpura: Secondary | ICD-10-CM | POA: Diagnosis not present

## 2020-06-03 DIAGNOSIS — I480 Paroxysmal atrial fibrillation: Secondary | ICD-10-CM | POA: Diagnosis not present

## 2020-06-03 DIAGNOSIS — Z7901 Long term (current) use of anticoagulants: Secondary | ICD-10-CM | POA: Diagnosis not present

## 2020-06-03 DIAGNOSIS — D6869 Other thrombophilia: Secondary | ICD-10-CM | POA: Diagnosis not present

## 2020-06-04 DIAGNOSIS — C61 Malignant neoplasm of prostate: Secondary | ICD-10-CM | POA: Diagnosis not present

## 2020-06-07 IMAGING — CR DG CHEST 2V
2 series · 2 of 2 positions shown · non-contrast
Comparison: 09/16/2002

CLINICAL DATA: Presyncope

EXAM:
CHEST - 2 VIEW

[w chest lat]
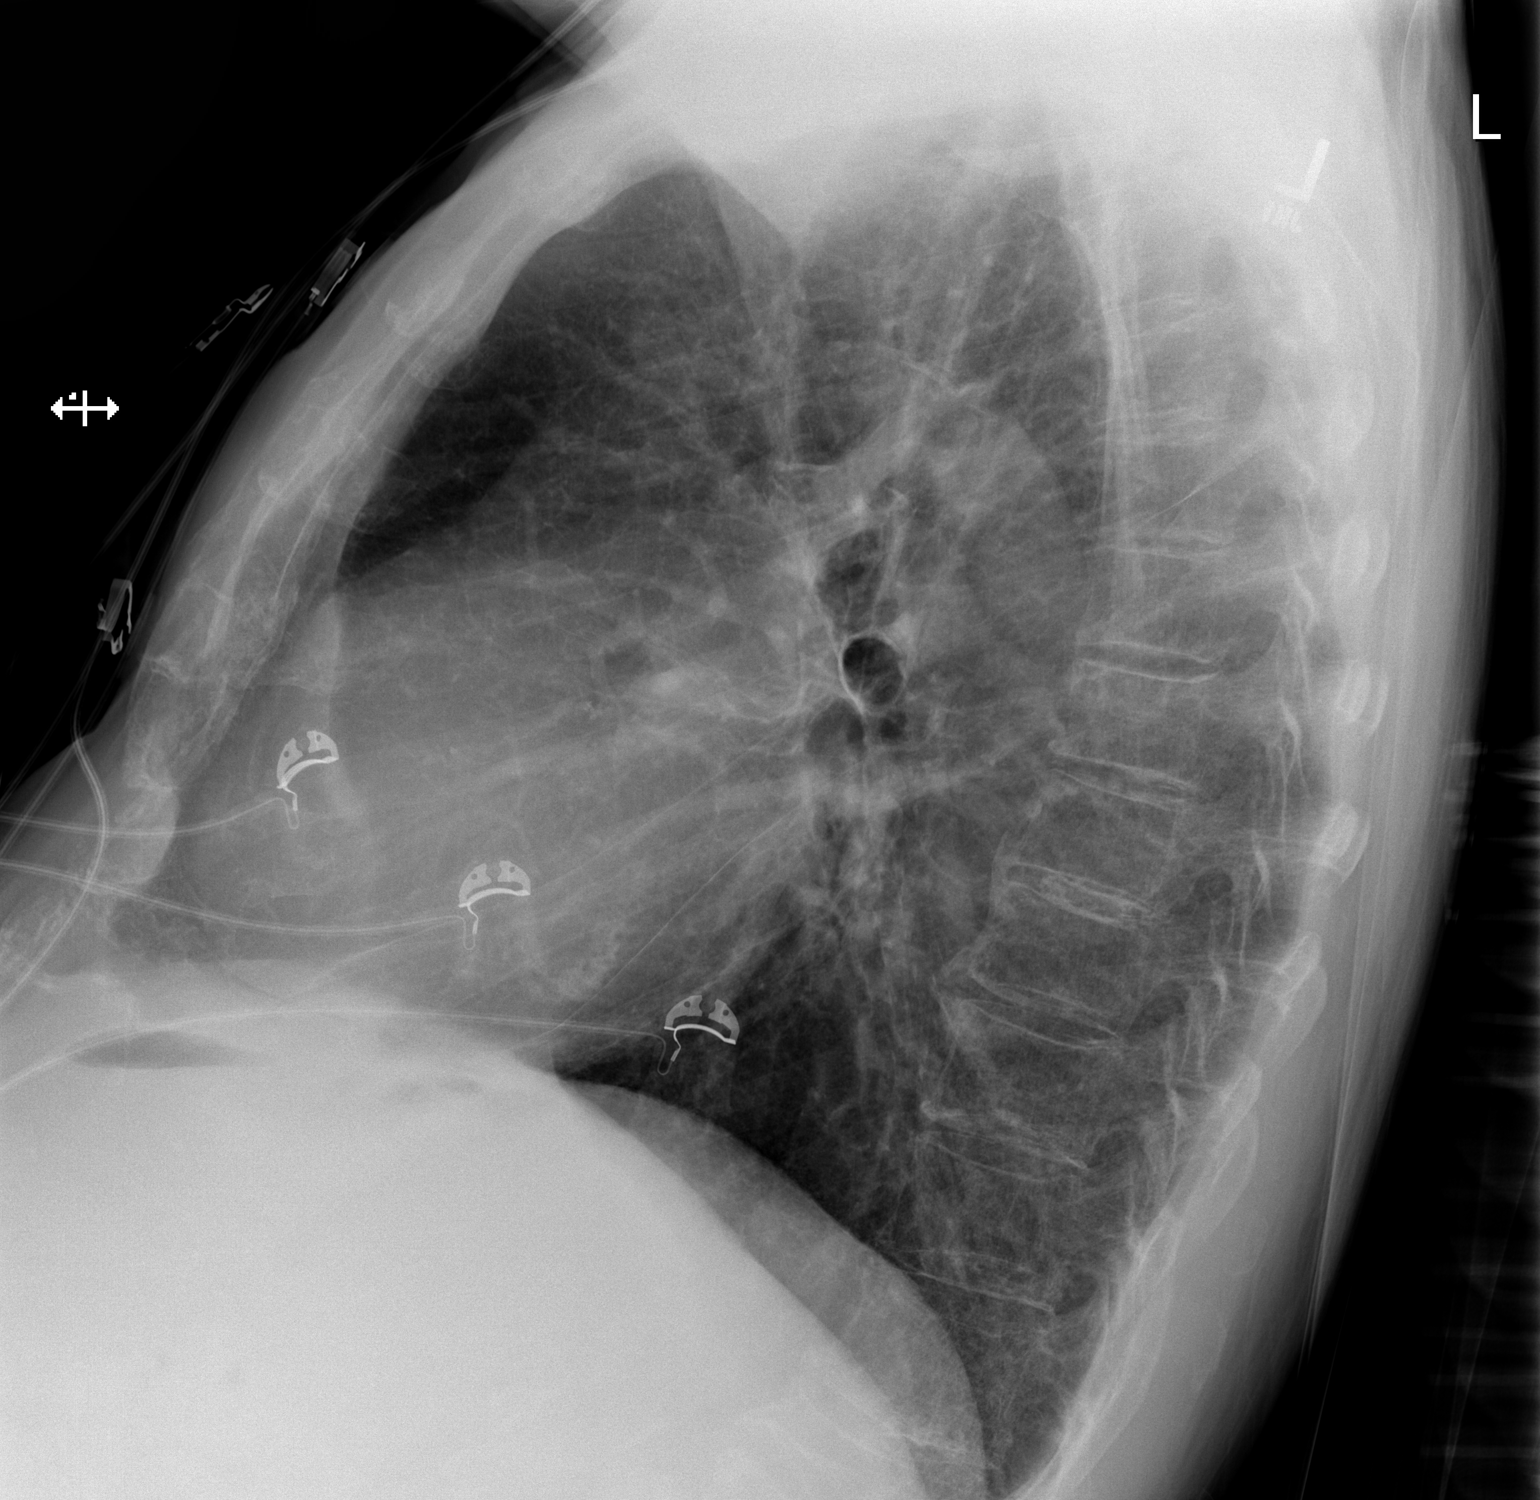

[x chest ap]
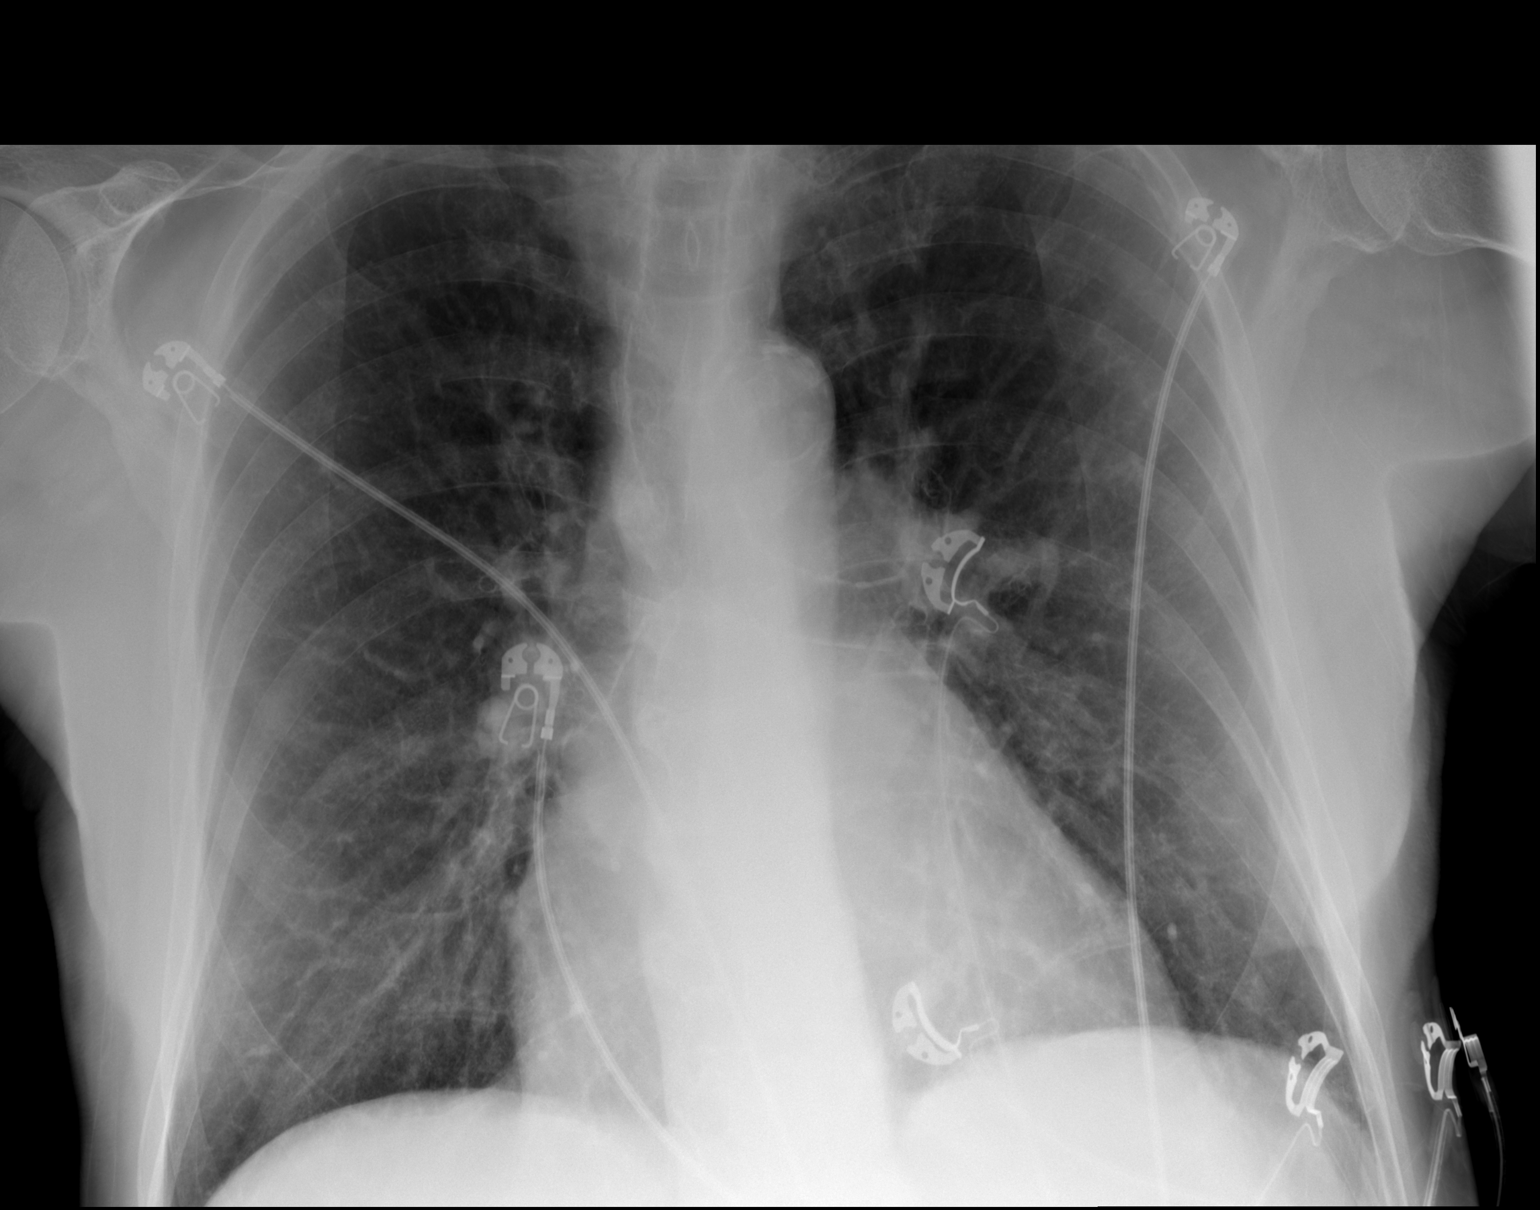

[2 of 2 positions shown; findings below may reference images not displayed]

FINDINGS: Mild apical pleural thickening and calcification. Hyperinflated
lungs. No focal airspace disease or effusion. Mild cardiomegaly with
aortic atherosclerosis. No pneumothorax.
IMPRESSION: No active cardiopulmonary disease.  Borderline to mild cardiomegaly.

## 2020-06-07 IMAGING — CT CT HEAD W/O CM
4 series · 16 of 47 positions shown, 18 images · non-contrast
Comparison: None.

CLINICAL DATA: 85 y/o  M; presyncope.

EXAM:
CT HEAD WITHOUT CONTRAST
TECHNIQUE: Contiguous axial images were obtained from the base of the skull
through the vertex without intravenous contrast.

[Series 2: head wo · axial · 0.47mm/px · z∈[-2,+113]mm · 7 of 31 slices shown, 9 images]
[im 4/31  brain]
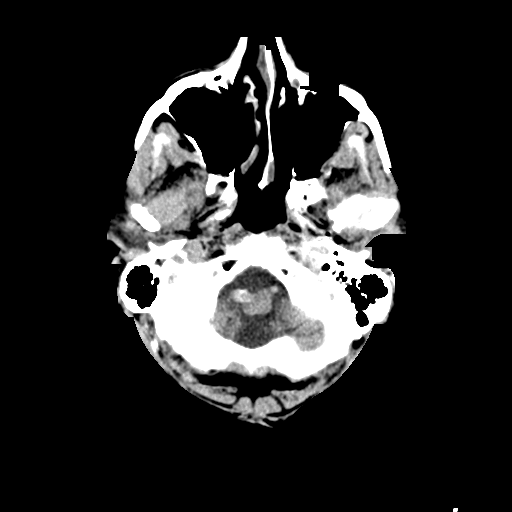
[im 4/31  bone]
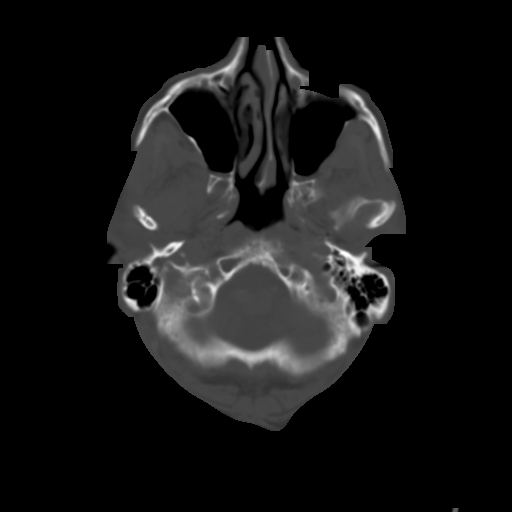
[im 8/31  brain]
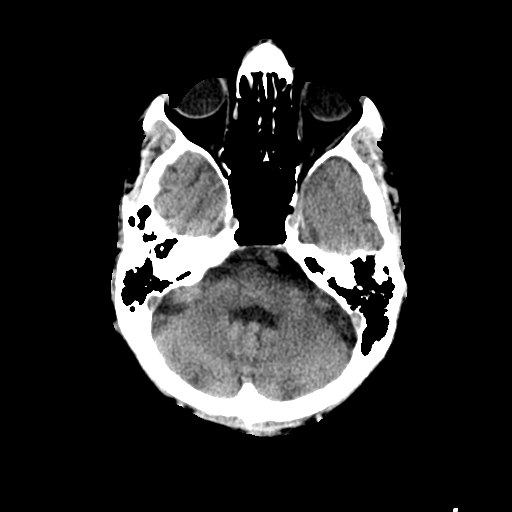
[im 12/31  brain]
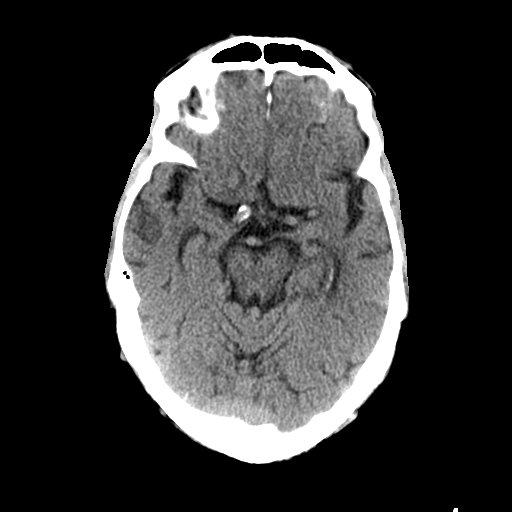
[im 16/31  brain]
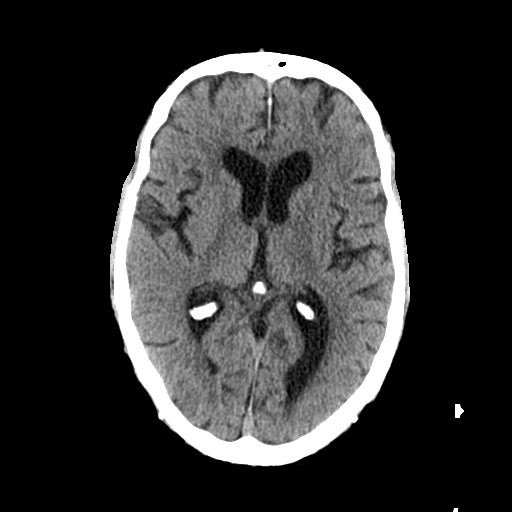
[im 19/31  brain]
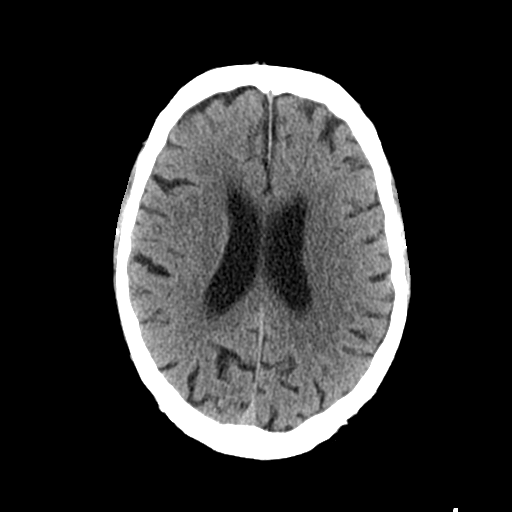
[im 19/31  bone]
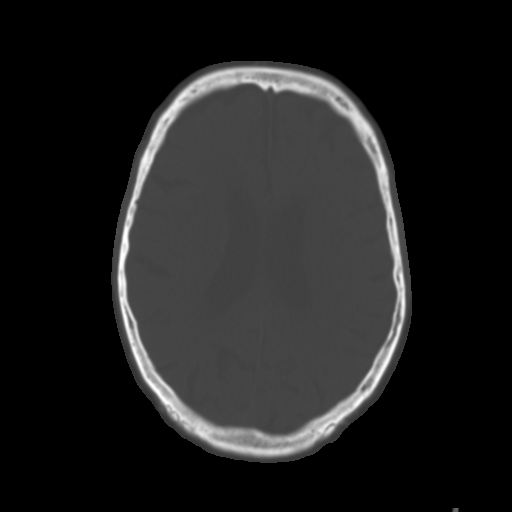
[im 23/31  brain]
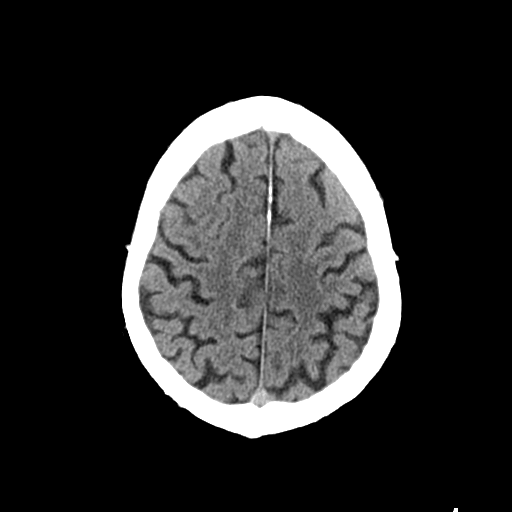
[im 27/31  brain]
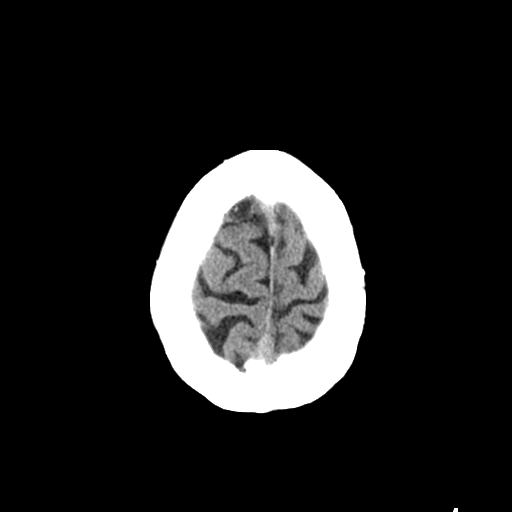

[Series 3: head bone · axial · 0.47mm/px · z∈[-3,+27]mm · 3 of 77 slices shown]
[im 8/77  bone]
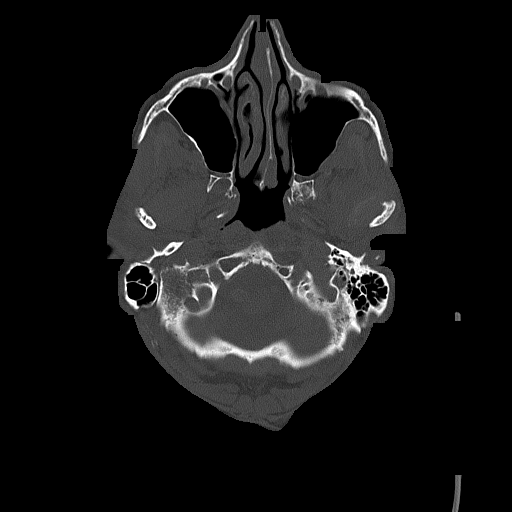
[im 16/77  bone]
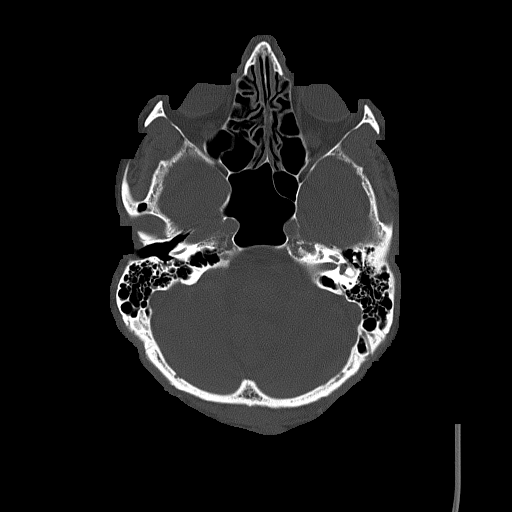
[im 23/77  bone]
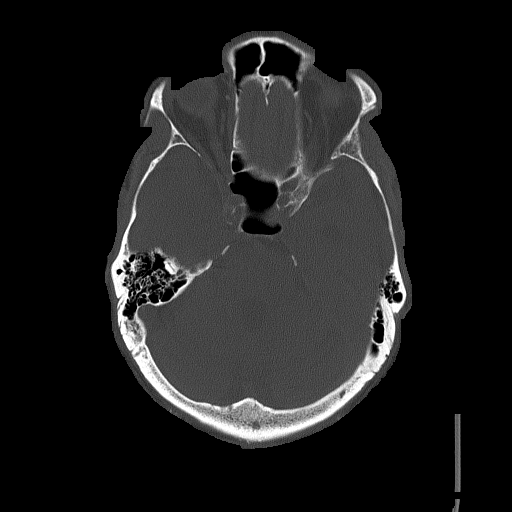

[Series 4: coronal soft tissue · coronal · 0.29mm/px · 3 of 67 slices shown]
[im 23/67  brain]
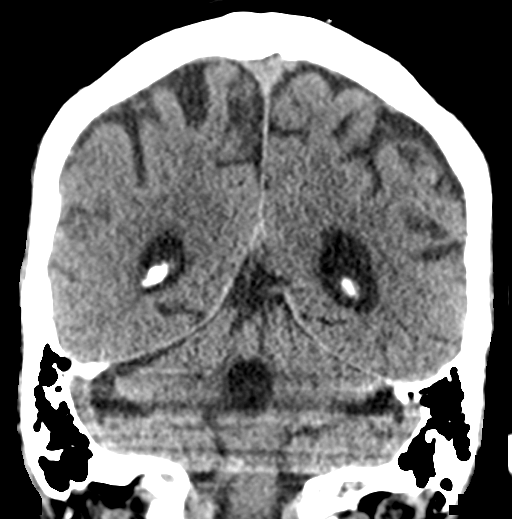
[im 30/67  brain]
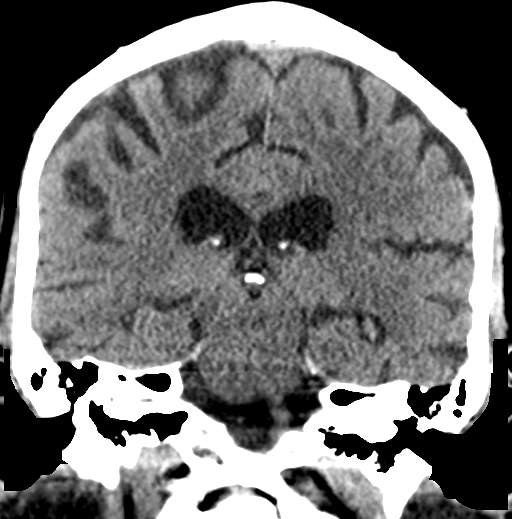
[im 37/67  brain]
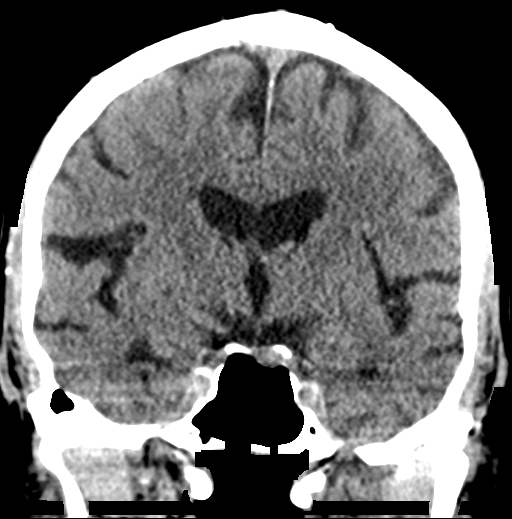

[Series 5: sagittal soft tissue · sagittal · 0.30mm/px · 3 of 51 slices shown]
[im 17/51  brain]
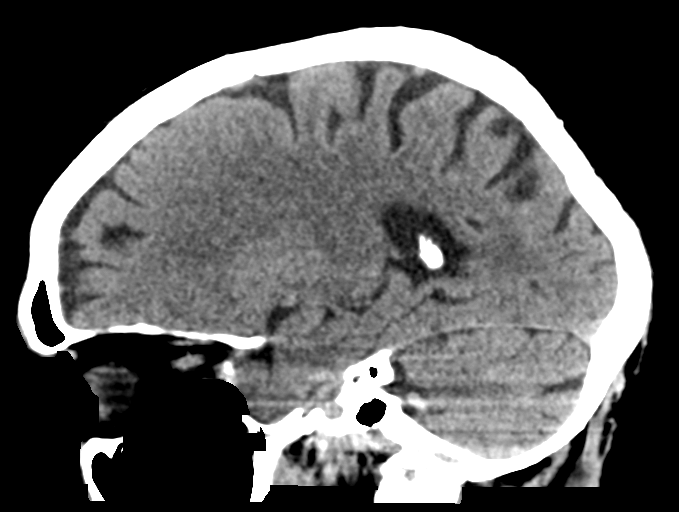
[im 26/51  brain]
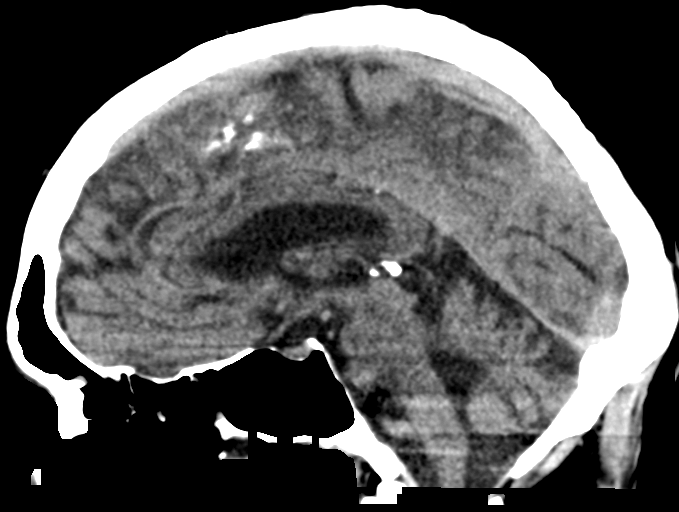
[im 34/51  brain]
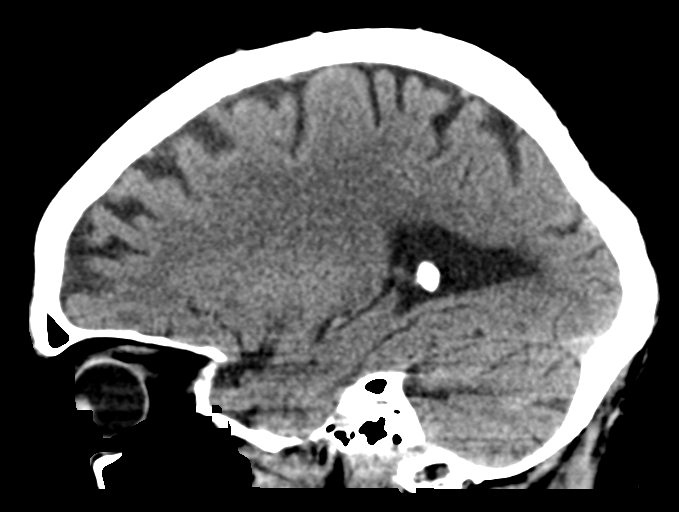

[16 of 47 positions shown; findings below may reference images not displayed]

FINDINGS: Brain: No evidence of acute infarction, hemorrhage, hydrocephalus,
extra-axial collection or mass lesion/mass effect. Few nonspecific
white matter hypodensities are compatible with mild chronic
microvascular ischemic changes. Mild volume loss of the brain.

Vascular: Calcific atherosclerosis of the carotid siphons. No
hyperdense vessel identified.

Skull: Normal. Negative for fracture or focal lesion.

Sinuses/Orbits: Mild paranasal sinus mucosal thickening. Normal
aeration of the mastoid air cells. Orbits are unremarkable.

Other: None.
IMPRESSION: 1. No acute intracranial abnormality.
2. Mild chronic microvascular ischemic changes and volume loss of
the brain for age.

## 2020-06-07 IMAGING — CR DG LUMBAR SPINE COMPLETE 4+V
5 series · 5 of 5 positions shown · non-contrast
Comparison: CT 05/26/2010

CLINICAL DATA: Presyncope, back pain

EXAM:
LUMBAR SPINE - COMPLETE 4+ VIEW

[t lumbar spine ap]
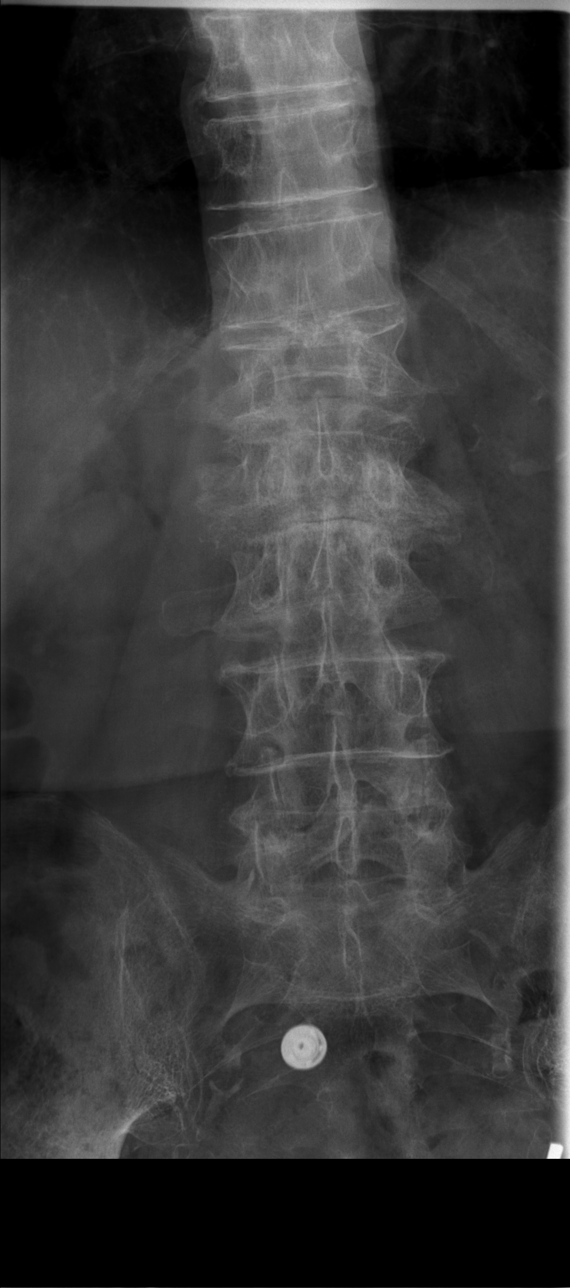

[t lumbar spine obl (1 of 2)]
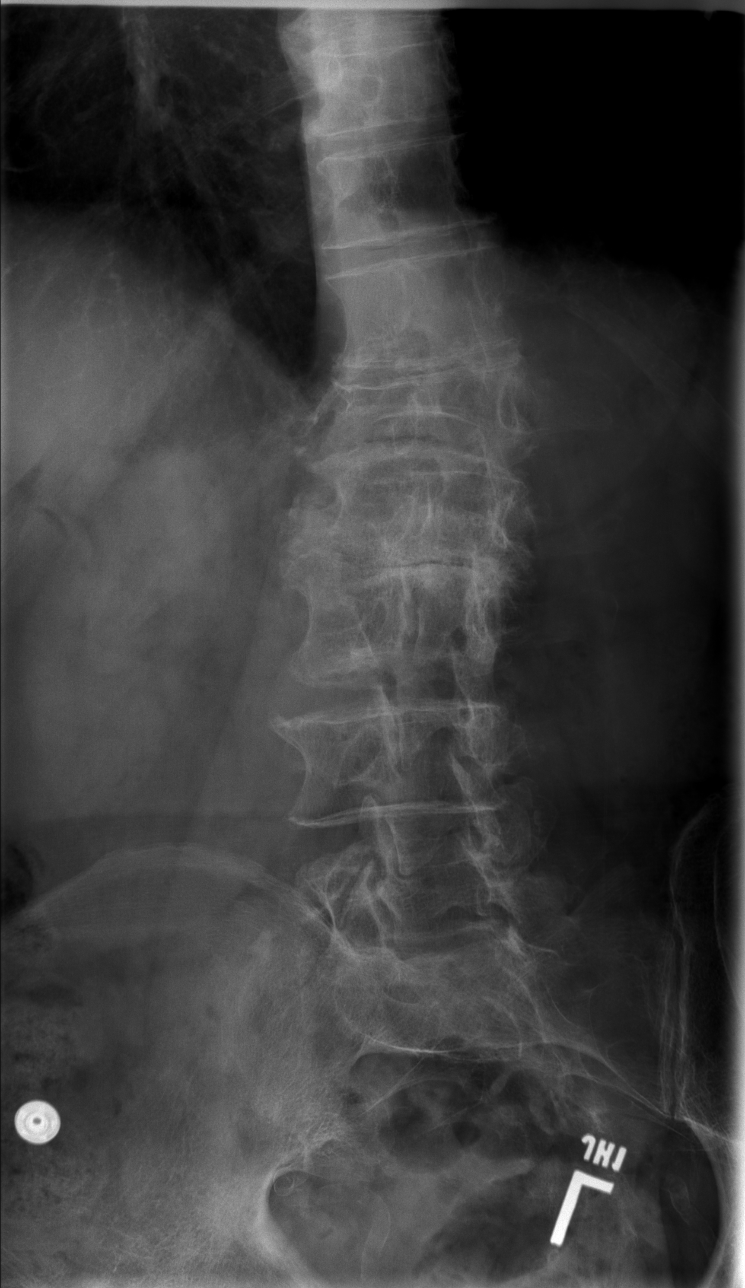

[t lumbar spine obl (2 of 2)]
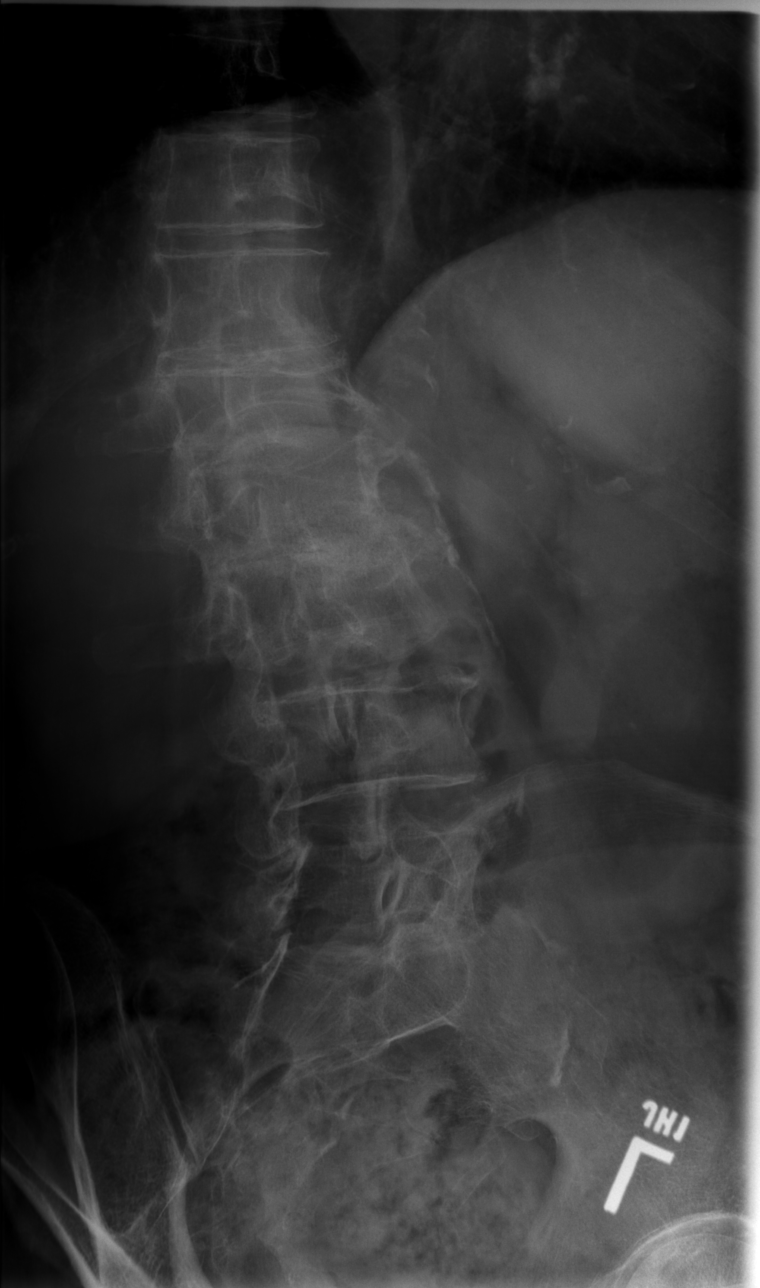

[t lumbar spine lat]
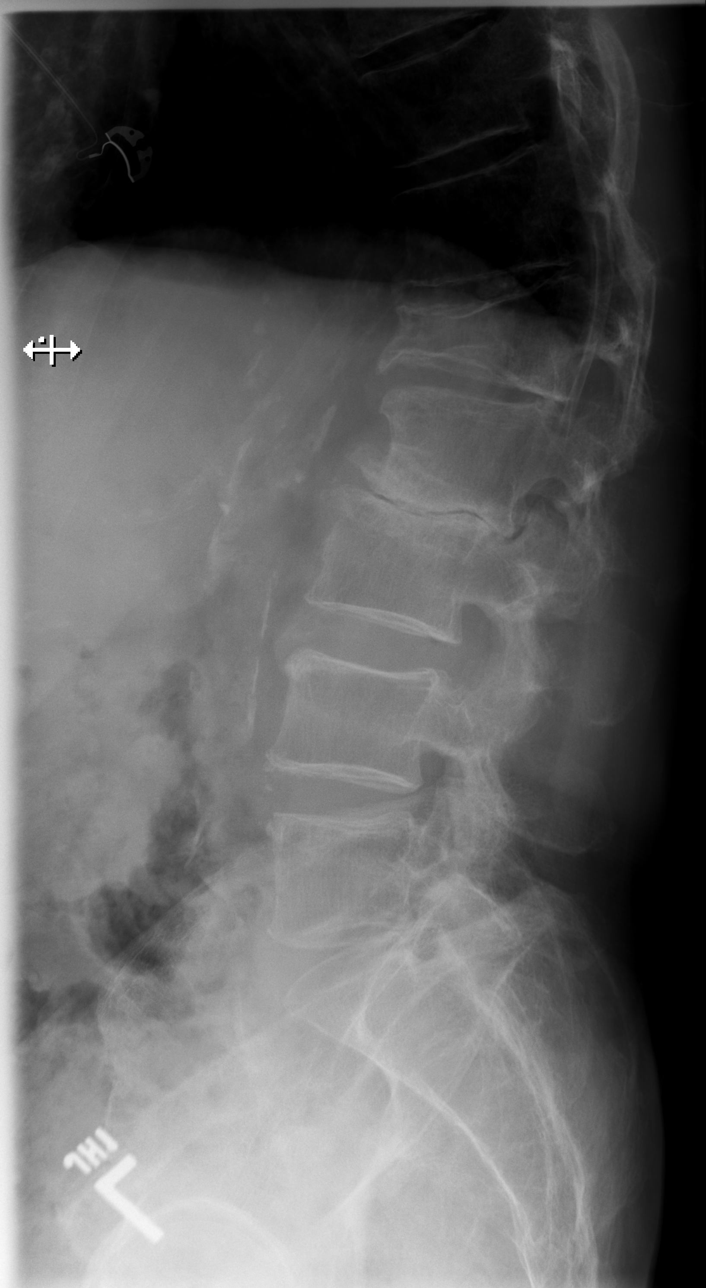

[t lumbar l-5 s-1 spot]
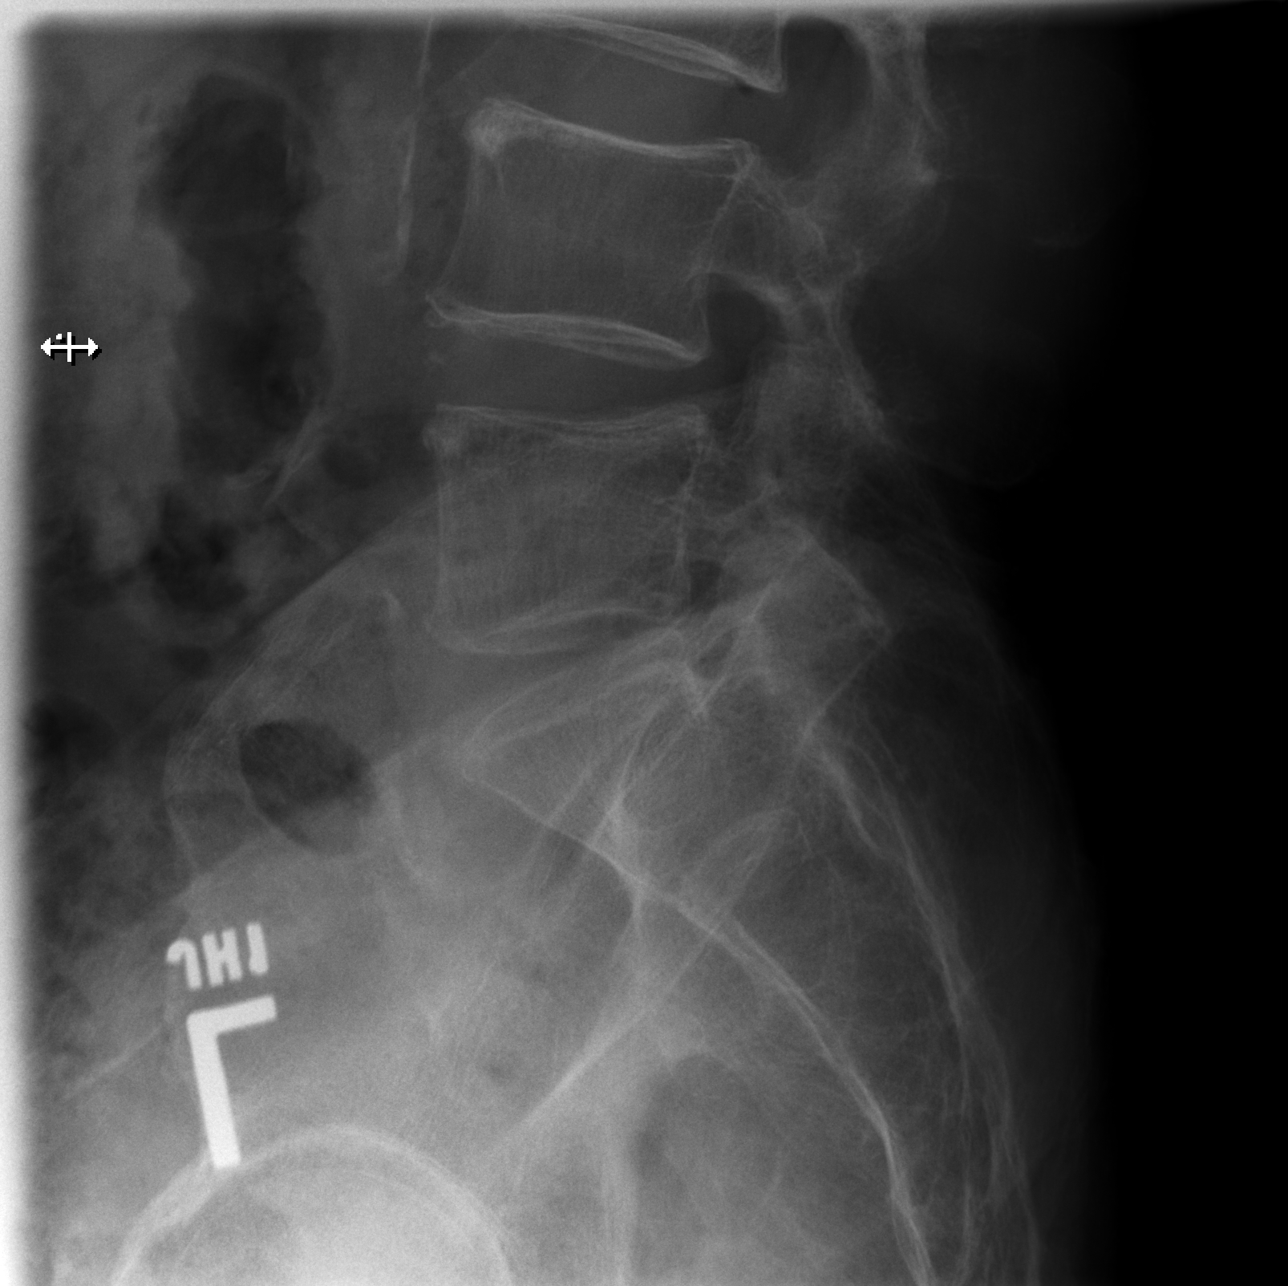

[5 of 5 positions shown; findings below may reference images not displayed]

FINDINGS: 6 mm retrolisthesis L2 on L3. Chronic moderate compression deformity
of L1. Marked degenerative change at L2-L3 with disc space
narrowing, endplate sclerosis and vacuum discs. Small anterior
osteophytes at all levels of the lumbar spine. Moderate degenerative
changes at L1-L2.
IMPRESSION: 1. Chronic compression deformity at L1.
2. No acute osseous abnormality
3. Retrolisthesis L2 on L3 with marked degenerative change at L2-L3.

## 2020-06-11 DIAGNOSIS — E291 Testicular hypofunction: Secondary | ICD-10-CM | POA: Diagnosis not present

## 2020-06-11 DIAGNOSIS — Z8546 Personal history of malignant neoplasm of prostate: Secondary | ICD-10-CM | POA: Diagnosis not present

## 2020-06-11 DIAGNOSIS — N5201 Erectile dysfunction due to arterial insufficiency: Secondary | ICD-10-CM | POA: Diagnosis not present

## 2020-06-11 DIAGNOSIS — R351 Nocturia: Secondary | ICD-10-CM | POA: Diagnosis not present

## 2020-06-16 DIAGNOSIS — E119 Type 2 diabetes mellitus without complications: Secondary | ICD-10-CM | POA: Diagnosis not present

## 2020-07-08 DIAGNOSIS — I480 Paroxysmal atrial fibrillation: Secondary | ICD-10-CM | POA: Diagnosis not present

## 2020-07-08 DIAGNOSIS — Z23 Encounter for immunization: Secondary | ICD-10-CM | POA: Diagnosis not present

## 2020-07-08 DIAGNOSIS — Z7901 Long term (current) use of anticoagulants: Secondary | ICD-10-CM | POA: Diagnosis not present

## 2020-07-26 NOTE — Progress Notes (Signed)
Henry Holland Date of Birth: [birth year] Medical Record #[id number]  History of Present Illness: Mr. Henry Holland is seen today for follow up Afib. He has a history of chronic atrial fibrillation dating back to 2002. This has been managed with rate control and anticoagulation. He has no history of coronary disease. He has had several stress tests over the years the last January 2009 which was normal. He had an echocardiogram in 2003 which showed mild mitral and tricuspid insufficiency with normal LV function.   He was seen in November 2020 with symptoms of fatigue. A murmur was noted. Echo showed normal LV function with at least moderate MR  On follow up today he is doing very well.  He exercises regularly and does a lot of yard work. No chest pain, palpitations, or SOB.  He reports his coumadin has been therapeutic. No bleeding. No TIA or CVA symptoms.   Current Outpatient Medications on File Prior to Visit  Medication Sig Dispense Refill  . Ascorbic Acid (VITA-C PO) Take by mouth.    . Calcium Citrate (CITRACAL PO) Take 600 mg by mouth 2 (two) times daily.      Marland Kitchen losartan (COZAAR) 100 MG tablet Take 100 mg by mouth daily.      . rosuvastatin (CRESTOR) 5 MG tablet Take 5 mg by mouth daily.      Marland Kitchen VITAMIN D, ERGOCALCIFEROL, PO Take 1 tablet by mouth daily.     Marland Kitchen warfarin (COUMADIN) 5 MG tablet daily. Take 10 mg Monday, Friday. Take 7.5 mg all other days.     No current facility-administered medications on file prior to visit.    No Known Allergies  Past Medical History:  Diagnosis Date  . Arthritis   . Chronic atrial fibrillation ([place])   . Dysrhythmia   . History of radiation therapy   . Hyperlipidemia   . Hypertension   . Prostate cancer [facility])     Past Surgical History:  Procedure Laterality Date  . HERNIA REPAIR     X2    Social History   Tobacco Use  Smoking Status Never Smoker  Smokeless Tobacco Never Used    Social History   Substance and Sexual Activity  Alcohol Use No     Family History  Problem Relation Age of Onset  . Stroke Mother 23    Review of Systems: As noted in history of present illness.  All other systems were reviewed and are negative.  Physical Exam: BP 125/64   Pulse 64   Temp (!) 97.3 F (36.3 C)   Ht 6\' 1"  (1.854 m)   Wt 181 lb 6.4 oz (82.3 kg)   SpO2 95%   BMI 23.93 kg/m  GENERAL:  Well appearing WM in NAD HEENT:  PERRL, EOMI, sclera are clear. Oropharynx is clear. NECK:  No jugular venous distention, carotid upstroke brisk and symmetric, no bruits, no thyromegaly or adenopathy LUNGS:  Clear to auscultation bilaterally CHEST:  Unremarkable HEART:  RRR,  PMI not displaced or sustained,S1 and S2 within normal limits, no S3, no S4: no clicks, no rubs, he has a gr 2/6 systolic murmur at the apex ABD:  Soft, nontender. BS +, no masses or bruits. No hepatomegaly, no splenomegaly EXT:  2 + pulses throughout, no edema, no cyanosis no clubbing SKIN:  Warm and dry.  No rashes NEURO:  Alert and oriented x 3. Cranial nerves II through XII intact. PSYCH:  Cognitively intact    LABORATORY DATA: Ecg today shows Afib with  rate 64 bpm. Incomplete RBBB. ST-T changes c/w inferior ischemia. These changes are stable from last year.   I have personally reviewed and interpreted this study.  Labs reviewed from primary care in April 2017: glucose 120, sodium 132. Other chemistries, CBC, and TSH normal. Cholesterol 146, trig- 109, HDL 54, LDL 70  Labs dated 12/27/16: cholesterol 133, triglyderides 103, HDL 43, LDL 69. A1c 6%. Normal BMET and TSH. Dated 01/11/18: cholesterol 128, triglycerides 80, HDL 42, LDL 70. A1c 5.8%. Chemistries, TSH, CBC normal.  Dated 02/10/20: A1c 5.9%. cholesterol 136, triglycerides 99, HDL 47, LDL 69, CBC, CMET and TSH normal   Echo 07/07/19: IMPRESSIONS    1. Left ventricular ejection fraction, by visual estimation, is 60 to  65%. The left ventricle has normal function. There is no left ventricular  hypertrophy.   2. Global right ventricle has normal systolic function.The right  ventricular size is normal. No increase in right ventricular wall  thickness.  3. Left atrial size was severely dilated.  4. Right atrial size was severely dilated.  5. Mild mitral annular calcification.  6. Moderate thickening of the mitral valve leaflet(s).  7. The mitral valve is abnormal. Moderate mitral valve regurgitation.  8. MR is at least moderate.  9. The tricuspid valve is grossly normal. Tricuspid valve regurgitation  mild-moderate.  10. The aortic valve is tricuspid. Aortic valve regurgitation is not  visualized. Mild aortic valve stenosis.  11. The pulmonic valve was thickened. Pulmonic valve regurgitation is not  visualized.  12. Mildly elevated pulmonary artery systolic pressure.  Assessment / Plan: 1. Permanent atrial fibrillation. Rate is well controlled on diltiazem at low dose.  He is asymptomatic. Continue Coumadin long-term.  2. Hypertension, controlled.  3. Mitral insufficiency. Moderate by exam and Echo. Asymptomatic.    I will follow up in one year.

## 2020-07-30 ENCOUNTER — Encounter: Payer: Self-pay | Admitting: Cardiology

## 2020-07-30 ENCOUNTER — Other Ambulatory Visit: Payer: Self-pay

## 2020-07-30 ENCOUNTER — Ambulatory Visit: Payer: Medicare HMO | Admitting: Cardiology

## 2020-07-30 VITALS — BP 125/64 | HR 64 | Temp 97.3°F | Ht 73.0 in | Wt 181.4 lb

## 2020-07-30 DIAGNOSIS — I482 Chronic atrial fibrillation, unspecified: Secondary | ICD-10-CM | POA: Diagnosis not present

## 2020-07-30 DIAGNOSIS — I340 Nonrheumatic mitral (valve) insufficiency: Secondary | ICD-10-CM

## 2020-07-30 MED ORDER — DILTIAZEM HCL ER COATED BEADS 120 MG PO CP24: 120.0000 mg | ORAL_CAPSULE | Freq: Every day | ORAL | 3 refills | Status: AC

## 2020-07-30 NOTE — Addendum Note (Signed)
Addended by: Kathyrn Lass on: 07/30/2020 03:37 PM   Modules accepted: Orders

## 2020-08-04 DIAGNOSIS — M79672 Pain in left foot: Secondary | ICD-10-CM | POA: Diagnosis not present

## 2020-08-04 DIAGNOSIS — M2012 Hallux valgus (acquired), left foot: Secondary | ICD-10-CM | POA: Diagnosis not present

## 2020-08-04 DIAGNOSIS — M2022 Hallux rigidus, left foot: Secondary | ICD-10-CM | POA: Diagnosis not present

## 2020-08-09 DIAGNOSIS — E1140 Type 2 diabetes mellitus with diabetic neuropathy, unspecified: Secondary | ICD-10-CM | POA: Diagnosis not present

## 2020-08-09 DIAGNOSIS — E785 Hyperlipidemia, unspecified: Secondary | ICD-10-CM | POA: Diagnosis not present

## 2020-08-09 DIAGNOSIS — D6869 Other thrombophilia: Secondary | ICD-10-CM | POA: Diagnosis not present

## 2020-08-09 DIAGNOSIS — E291 Testicular hypofunction: Secondary | ICD-10-CM | POA: Diagnosis not present

## 2020-08-09 DIAGNOSIS — M810 Age-related osteoporosis without current pathological fracture: Secondary | ICD-10-CM | POA: Diagnosis not present

## 2020-08-09 DIAGNOSIS — E1129 Type 2 diabetes mellitus with other diabetic kidney complication: Secondary | ICD-10-CM | POA: Diagnosis not present

## 2020-08-09 DIAGNOSIS — I480 Paroxysmal atrial fibrillation: Secondary | ICD-10-CM | POA: Diagnosis not present

## 2020-08-09 DIAGNOSIS — Z7901 Long term (current) use of anticoagulants: Secondary | ICD-10-CM | POA: Diagnosis not present

## 2020-08-09 DIAGNOSIS — M2010 Hallux valgus (acquired), unspecified foot: Secondary | ICD-10-CM | POA: Diagnosis not present

## 2020-08-09 DIAGNOSIS — I10 Essential (primary) hypertension: Secondary | ICD-10-CM | POA: Diagnosis not present

## 2020-09-07 DIAGNOSIS — Z7901 Long term (current) use of anticoagulants: Secondary | ICD-10-CM | POA: Diagnosis not present

## 2020-09-07 DIAGNOSIS — D6869 Other thrombophilia: Secondary | ICD-10-CM | POA: Diagnosis not present

## 2020-09-07 DIAGNOSIS — I480 Paroxysmal atrial fibrillation: Secondary | ICD-10-CM | POA: Diagnosis not present

## 2020-10-12 DIAGNOSIS — I480 Paroxysmal atrial fibrillation: Secondary | ICD-10-CM | POA: Diagnosis not present

## 2020-10-12 DIAGNOSIS — Z7901 Long term (current) use of anticoagulants: Secondary | ICD-10-CM | POA: Diagnosis not present

## 2020-10-12 DIAGNOSIS — D6869 Other thrombophilia: Secondary | ICD-10-CM | POA: Diagnosis not present

## 2020-10-14 ENCOUNTER — Encounter: Payer: Self-pay | Admitting: Podiatry

## 2020-10-14 ENCOUNTER — Ambulatory Visit (INDEPENDENT_AMBULATORY_CARE_PROVIDER_SITE_OTHER): Payer: Medicare HMO | Admitting: Podiatry

## 2020-10-14 ENCOUNTER — Other Ambulatory Visit: Payer: Self-pay

## 2020-10-14 ENCOUNTER — Ambulatory Visit (INDEPENDENT_AMBULATORY_CARE_PROVIDER_SITE_OTHER): Payer: Medicare HMO

## 2020-10-14 DIAGNOSIS — M2012 Hallux valgus (acquired), left foot: Secondary | ICD-10-CM | POA: Diagnosis not present

## 2020-10-14 DIAGNOSIS — D2372 Other benign neoplasm of skin of left lower limb, including hip: Secondary | ICD-10-CM | POA: Diagnosis not present

## 2020-10-14 DIAGNOSIS — Z0000 Encounter for general adult medical examination without abnormal findings: Secondary | ICD-10-CM | POA: Insufficient documentation

## 2020-10-14 DIAGNOSIS — D6859 Other primary thrombophilia: Secondary | ICD-10-CM | POA: Insufficient documentation

## 2020-10-14 DIAGNOSIS — Z7901 Long term (current) use of anticoagulants: Secondary | ICD-10-CM | POA: Insufficient documentation

## 2020-10-17 NOTE — Progress Notes (Signed)
Subjective:  Patient ID: Henry Holland, male    DOB: [birth year],  MRN: [id number] HPI Chief Complaint  Patient presents with  . Foot Pain    1st MPJ left - aching, callused area x 3-4 months, moleskin to cushion, worse with walking, tried voltaren gel and CBD oil, orthopedist treated same area about 6 years ago  . New Patient (Initial Visit)    87 y.o. male presents with the above complaint.   ROS: Denies fever chills nausea vomiting muscle aches pains calf pain back pain chest pain shortness of breath.  Past Medical History:  Diagnosis Date  . Arthritis   . Chronic atrial fibrillation ([place])   . Dysrhythmia   . History of radiation therapy   . Hyperlipidemia   . Hypertension   . Prostate cancer [facility])    Past Surgical History:  Procedure Laterality Date  . HERNIA REPAIR     X2    Current Outpatient Medications:  .  glucose blood (ONETOUCH VERIO) test strip, Use to check blood sugar 2 times a day as directed Dx E11.4, Disp: , Rfl:  .  Lancets (ONETOUCH ULTRASOFT) lancets, use for OneTouch Ultra meter to test up to bid, Disp: , Rfl:  .  Multiple Minerals-Vitamins (CITRACAL PLUS) TABS, 2 (two) times daily., Disp: , Rfl:  .  zoledronic acid (RECLAST) 5 MG/100ML SOLN injection, Infuse 5mg  IV once a year (start February 2018), Disp: , Rfl:  .  Ascorbic Acid (VITA-C PO), Take by mouth., Disp: , Rfl:  .  Calcium Citrate (CITRACAL PO), Take 600 mg by mouth 2 (two) times daily.  , Disp: , Rfl:  .  diltiazem (CARDIZEM CD) 120 MG 24 hr capsule, Take 1 capsule (120 mg total) by mouth daily., Disp: 90 capsule, Rfl: 3 .  losartan (COZAAR) 100 MG tablet, Take 100 mg by mouth daily.  , Disp: , Rfl:  .  rosuvastatin (CRESTOR) 5 MG tablet, Take 5 mg by mouth daily.  , Disp: , Rfl:  .  SHINGRIX injection, , Disp: , Rfl:  .  VITAMIN D, ERGOCALCIFEROL, PO, Take 1 tablet by mouth daily. , Disp: , Rfl:  .  warfarin (COUMADIN) 5 MG tablet, daily. Take 10 mg Monday, Friday. Take 7.5 mg all other days.,  Disp: , Rfl:   Allergies  Allergen Reactions  . Atorvastatin     Other reaction(s): myalgias  . Ezetimibe     Other reaction(s): myalgias  . Fenofibrate     Other reaction(s): myalgias   Review of Systems Objective:  There were no vitals filed for this visit.  General: Well developed, nourished, in no acute distress, alert and oriented x3   Dermatological: Skin is warm, dry and supple bilateral. Nails x 10 are well maintained; remaining integument appears unremarkable at this time. There are no open sores, no preulcerative lesions, no rash or signs of infection present. Hyperkeratotic lesion plantar aspect of the fifth metatarsals.  Vascular: Dorsalis Pedis artery and Posterior Tibial artery pedal pulses are 2/4 bilateral with immedate capillary fill time. Pedal hair growth present. No varicosities and no lower extremity edema present bilateral.   Neruologic: Grossly intact via light touch bilateral. Vibratory intact via tuning fork bilateral. Protective threshold with Semmes Wienstein monofilament intact to all pedal sites bilateral. Patellar and Achilles deep tendon reflexes 2+ bilateral. No Babinski or clonus noted bilateral.   Musculoskeletal: No gross boney pedal deformities bilateral. No pain, crepitus, or limitation noted with foot and ankle range of motion bilateral. Muscular strength  5/5 in all groups tested bilateral.  Gait: Unassisted, Nonantalgic.    Radiographs:  Radiographs taken today demonstrate a cavus foot type left. Demonstrates what appears to be a old fracture to the proximal phalanx of the hallux with mild dorsiflexion. Osteoarthritic changes at the first metatarsophalangeal joint particularly involving the fibular sesamoid and mockingbird's medial calcific sclerosis is also identifiable. This is noted particularly in the dorsalis pedis.  Assessment & Plan:   Assessment: Capsulitis osteoarthritis peripheral vascular disease. Benign skin lesions subfifth  bilateral.  Plan: Discussed etiology pathology conservative versus surgical therapies. At this point we will treat this conservatively and he will follow up with me as needed. Debrided all reactive hyperkeratotic tissue debrided benign skin lesions bilateral. Follow-up with me.     Koehn Salehi T. [place], [place]

## 2020-11-10 DIAGNOSIS — Z7901 Long term (current) use of anticoagulants: Secondary | ICD-10-CM | POA: Diagnosis not present

## 2020-11-10 DIAGNOSIS — I480 Paroxysmal atrial fibrillation: Secondary | ICD-10-CM | POA: Diagnosis not present

## 2020-11-10 DIAGNOSIS — D6869 Other thrombophilia: Secondary | ICD-10-CM | POA: Diagnosis not present

## 2020-11-17 DIAGNOSIS — E119 Type 2 diabetes mellitus without complications: Secondary | ICD-10-CM | POA: Diagnosis not present

## 2020-11-23 DIAGNOSIS — D1801 Hemangioma of skin and subcutaneous tissue: Secondary | ICD-10-CM | POA: Diagnosis not present

## 2020-11-23 DIAGNOSIS — L821 Other seborrheic keratosis: Secondary | ICD-10-CM | POA: Diagnosis not present

## 2020-11-23 DIAGNOSIS — L570 Actinic keratosis: Secondary | ICD-10-CM | POA: Diagnosis not present

## 2020-11-23 DIAGNOSIS — Z85828 Personal history of other malignant neoplasm of skin: Secondary | ICD-10-CM | POA: Diagnosis not present

## 2020-11-23 DIAGNOSIS — L853 Xerosis cutis: Secondary | ICD-10-CM | POA: Diagnosis not present

## 2020-11-23 DIAGNOSIS — L812 Freckles: Secondary | ICD-10-CM | POA: Diagnosis not present

## 2020-11-23 DIAGNOSIS — L720 Epidermal cyst: Secondary | ICD-10-CM | POA: Diagnosis not present

## 2020-12-16 DIAGNOSIS — M48061 Spinal stenosis, lumbar region without neurogenic claudication: Secondary | ICD-10-CM | POA: Diagnosis not present

## 2020-12-16 DIAGNOSIS — D6869 Other thrombophilia: Secondary | ICD-10-CM | POA: Diagnosis not present

## 2020-12-16 DIAGNOSIS — Z7901 Long term (current) use of anticoagulants: Secondary | ICD-10-CM | POA: Diagnosis not present

## 2020-12-16 DIAGNOSIS — I480 Paroxysmal atrial fibrillation: Secondary | ICD-10-CM | POA: Diagnosis not present

## 2020-12-22 DIAGNOSIS — M5459 Other low back pain: Secondary | ICD-10-CM | POA: Diagnosis not present

## 2021-01-03 DIAGNOSIS — M5416 Radiculopathy, lumbar region: Secondary | ICD-10-CM | POA: Diagnosis not present

## 2021-01-03 DIAGNOSIS — M47816 Spondylosis without myelopathy or radiculopathy, lumbar region: Secondary | ICD-10-CM | POA: Insufficient documentation

## 2021-01-04 DIAGNOSIS — M545 Low back pain, unspecified: Secondary | ICD-10-CM | POA: Diagnosis not present

## 2021-01-18 DIAGNOSIS — M545 Low back pain, unspecified: Secondary | ICD-10-CM | POA: Diagnosis not present

## 2021-01-25 DIAGNOSIS — D6869 Other thrombophilia: Secondary | ICD-10-CM | POA: Diagnosis not present

## 2021-01-25 DIAGNOSIS — I480 Paroxysmal atrial fibrillation: Secondary | ICD-10-CM | POA: Diagnosis not present

## 2021-01-25 DIAGNOSIS — Z7901 Long term (current) use of anticoagulants: Secondary | ICD-10-CM | POA: Diagnosis not present

## 2021-02-03 DIAGNOSIS — M545 Low back pain, unspecified: Secondary | ICD-10-CM | POA: Diagnosis not present

## 2021-02-07 DIAGNOSIS — H2513 Age-related nuclear cataract, bilateral: Secondary | ICD-10-CM | POA: Diagnosis not present

## 2021-02-07 DIAGNOSIS — E119 Type 2 diabetes mellitus without complications: Secondary | ICD-10-CM | POA: Diagnosis not present

## 2021-02-07 DIAGNOSIS — H35363 Drusen (degenerative) of macula, bilateral: Secondary | ICD-10-CM | POA: Diagnosis not present

## 2021-02-07 DIAGNOSIS — H40013 Open angle with borderline findings, low risk, bilateral: Secondary | ICD-10-CM | POA: Diagnosis not present

## 2021-02-07 DIAGNOSIS — H5203 Hypermetropia, bilateral: Secondary | ICD-10-CM | POA: Diagnosis not present

## 2021-02-11 DIAGNOSIS — M810 Age-related osteoporosis without current pathological fracture: Secondary | ICD-10-CM | POA: Diagnosis not present

## 2021-02-11 DIAGNOSIS — E291 Testicular hypofunction: Secondary | ICD-10-CM | POA: Diagnosis not present

## 2021-02-11 DIAGNOSIS — E1140 Type 2 diabetes mellitus with diabetic neuropathy, unspecified: Secondary | ICD-10-CM | POA: Diagnosis not present

## 2021-02-11 DIAGNOSIS — E785 Hyperlipidemia, unspecified: Secondary | ICD-10-CM | POA: Diagnosis not present

## 2021-02-11 DIAGNOSIS — Z125 Encounter for screening for malignant neoplasm of prostate: Secondary | ICD-10-CM | POA: Diagnosis not present

## 2021-02-22 DIAGNOSIS — M810 Age-related osteoporosis without current pathological fracture: Secondary | ICD-10-CM | POA: Diagnosis not present

## 2021-02-22 DIAGNOSIS — E785 Hyperlipidemia, unspecified: Secondary | ICD-10-CM | POA: Diagnosis not present

## 2021-02-22 DIAGNOSIS — Z1389 Encounter for screening for other disorder: Secondary | ICD-10-CM | POA: Diagnosis not present

## 2021-02-22 DIAGNOSIS — E1129 Type 2 diabetes mellitus with other diabetic kidney complication: Secondary | ICD-10-CM | POA: Diagnosis not present

## 2021-02-22 DIAGNOSIS — I340 Nonrheumatic mitral (valve) insufficiency: Secondary | ICD-10-CM | POA: Diagnosis not present

## 2021-02-22 DIAGNOSIS — Z7901 Long term (current) use of anticoagulants: Secondary | ICD-10-CM | POA: Diagnosis not present

## 2021-02-22 DIAGNOSIS — R82998 Other abnormal findings in urine: Secondary | ICD-10-CM | POA: Diagnosis not present

## 2021-02-22 DIAGNOSIS — E1140 Type 2 diabetes mellitus with diabetic neuropathy, unspecified: Secondary | ICD-10-CM | POA: Diagnosis not present

## 2021-02-22 DIAGNOSIS — Z0000 Encounter for general adult medical examination without abnormal findings: Secondary | ICD-10-CM | POA: Diagnosis not present

## 2021-02-22 DIAGNOSIS — Z1331 Encounter for screening for depression: Secondary | ICD-10-CM | POA: Diagnosis not present

## 2021-02-22 DIAGNOSIS — D6869 Other thrombophilia: Secondary | ICD-10-CM | POA: Diagnosis not present

## 2021-02-22 DIAGNOSIS — Z1212 Encounter for screening for malignant neoplasm of rectum: Secondary | ICD-10-CM | POA: Diagnosis not present

## 2021-02-22 DIAGNOSIS — I10 Essential (primary) hypertension: Secondary | ICD-10-CM | POA: Diagnosis not present

## 2021-02-22 DIAGNOSIS — I480 Paroxysmal atrial fibrillation: Secondary | ICD-10-CM | POA: Diagnosis not present

## 2021-03-24 DIAGNOSIS — M810 Age-related osteoporosis without current pathological fracture: Secondary | ICD-10-CM | POA: Diagnosis not present

## 2021-03-24 DIAGNOSIS — Z7901 Long term (current) use of anticoagulants: Secondary | ICD-10-CM | POA: Diagnosis not present

## 2021-03-24 DIAGNOSIS — D6869 Other thrombophilia: Secondary | ICD-10-CM | POA: Diagnosis not present

## 2021-03-24 DIAGNOSIS — I480 Paroxysmal atrial fibrillation: Secondary | ICD-10-CM | POA: Diagnosis not present

## 2021-04-15 ENCOUNTER — Telehealth: Payer: Self-pay | Admitting: Cardiology

## 2021-04-15 NOTE — Telephone Encounter (Signed)
   Pt is requesting to switch from Dr. Martinique to Dr. Harrell Gave. He said he is closer to [place] office location

## 2021-04-20 NOTE — Telephone Encounter (Signed)
OK by me 

## 2021-04-21 DIAGNOSIS — I480 Paroxysmal atrial fibrillation: Secondary | ICD-10-CM | POA: Diagnosis not present

## 2021-04-21 DIAGNOSIS — D6869 Other thrombophilia: Secondary | ICD-10-CM | POA: Diagnosis not present

## 2021-04-21 DIAGNOSIS — E119 Type 2 diabetes mellitus without complications: Secondary | ICD-10-CM | POA: Diagnosis not present

## 2021-04-21 DIAGNOSIS — Z7901 Long term (current) use of anticoagulants: Secondary | ICD-10-CM | POA: Diagnosis not present

## 2021-05-26 DIAGNOSIS — Z7901 Long term (current) use of anticoagulants: Secondary | ICD-10-CM | POA: Diagnosis not present

## 2021-05-26 DIAGNOSIS — Z23 Encounter for immunization: Secondary | ICD-10-CM | POA: Diagnosis not present

## 2021-05-26 DIAGNOSIS — I480 Paroxysmal atrial fibrillation: Secondary | ICD-10-CM | POA: Diagnosis not present

## 2021-05-26 DIAGNOSIS — D6869 Other thrombophilia: Secondary | ICD-10-CM | POA: Diagnosis not present

## 2021-06-16 DIAGNOSIS — L812 Freckles: Secondary | ICD-10-CM | POA: Diagnosis not present

## 2021-06-16 DIAGNOSIS — Z85828 Personal history of other malignant neoplasm of skin: Secondary | ICD-10-CM | POA: Diagnosis not present

## 2021-06-16 DIAGNOSIS — L821 Other seborrheic keratosis: Secondary | ICD-10-CM | POA: Diagnosis not present

## 2021-06-16 DIAGNOSIS — L820 Inflamed seborrheic keratosis: Secondary | ICD-10-CM | POA: Diagnosis not present

## 2021-06-16 DIAGNOSIS — D1801 Hemangioma of skin and subcutaneous tissue: Secondary | ICD-10-CM | POA: Diagnosis not present

## 2021-06-16 DIAGNOSIS — L570 Actinic keratosis: Secondary | ICD-10-CM | POA: Diagnosis not present

## 2021-06-27 DIAGNOSIS — Z8546 Personal history of malignant neoplasm of prostate: Secondary | ICD-10-CM | POA: Diagnosis not present

## 2021-06-28 DIAGNOSIS — I480 Paroxysmal atrial fibrillation: Secondary | ICD-10-CM | POA: Diagnosis not present

## 2021-06-28 DIAGNOSIS — D6869 Other thrombophilia: Secondary | ICD-10-CM | POA: Diagnosis not present

## 2021-06-28 DIAGNOSIS — Z23 Encounter for immunization: Secondary | ICD-10-CM | POA: Diagnosis not present

## 2021-06-28 DIAGNOSIS — Z7901 Long term (current) use of anticoagulants: Secondary | ICD-10-CM | POA: Diagnosis not present

## 2021-07-01 DIAGNOSIS — Z8546 Personal history of malignant neoplasm of prostate: Secondary | ICD-10-CM | POA: Diagnosis not present

## 2021-07-01 DIAGNOSIS — E291 Testicular hypofunction: Secondary | ICD-10-CM | POA: Diagnosis not present

## 2021-07-28 DIAGNOSIS — I480 Paroxysmal atrial fibrillation: Secondary | ICD-10-CM | POA: Diagnosis not present

## 2021-07-28 DIAGNOSIS — D6869 Other thrombophilia: Secondary | ICD-10-CM | POA: Diagnosis not present

## 2021-07-28 DIAGNOSIS — Z7901 Long term (current) use of anticoagulants: Secondary | ICD-10-CM | POA: Diagnosis not present

## 2021-08-01 ENCOUNTER — Ambulatory Visit (HOSPITAL_BASED_OUTPATIENT_CLINIC_OR_DEPARTMENT_OTHER): Payer: Medicare HMO | Admitting: Cardiology

## 2021-08-01 ENCOUNTER — Other Ambulatory Visit: Payer: Self-pay

## 2021-08-01 ENCOUNTER — Encounter (HOSPITAL_BASED_OUTPATIENT_CLINIC_OR_DEPARTMENT_OTHER): Payer: Self-pay | Admitting: Cardiology

## 2021-08-01 VITALS — BP 126/74 | HR 66 | Ht 73.0 in | Wt 177.1 lb

## 2021-08-01 DIAGNOSIS — I350 Nonrheumatic aortic (valve) stenosis: Secondary | ICD-10-CM

## 2021-08-01 DIAGNOSIS — I4821 Permanent atrial fibrillation: Secondary | ICD-10-CM | POA: Diagnosis not present

## 2021-08-01 DIAGNOSIS — I340 Nonrheumatic mitral (valve) insufficiency: Secondary | ICD-10-CM

## 2021-08-01 DIAGNOSIS — D6859 Other primary thrombophilia: Secondary | ICD-10-CM | POA: Diagnosis not present

## 2021-08-01 DIAGNOSIS — Z7901 Long term (current) use of anticoagulants: Secondary | ICD-10-CM | POA: Diagnosis not present

## 2021-08-01 NOTE — Progress Notes (Signed)
Cardiology Office Note:    Date:  08/01/2021   ID:  Henry Holland, DOB 1933/04/07, MRN [id number]  PCP:  Ginger Organ., MD  Cardiologist:  Peter Martinique, MD  Referring MD: Ginger Organ., MD   CC: follow up  History of Present Illness:    Henry Holland is a 87 y.o. male with a hx of permanent atrial fibrillation on coumadin, hypertension who is seen for follow up today and to establish care with me. Previously he was a patient of Dr. Martinique.  Last seen by Dr. Martinique 07/30/2020. He was doing well at that time.  Atrial fibrillation: permanent, since 2002. Tolerating coumadin well, no significant bruising or bleeding. Has discussed DOACs with Dr. Brigitte Pulse and Dr. Martinique in the past. We reviewed today as well, pros/cons. Shared decision making, we both agreed that given how well he is doing, it is reasonable to continue coumadin. If/when apixaban becomes generic, he would reconsider DOAC.  Episode of bradycardia: single limited event, has not had a similar issue in 4-5 years. Has tolerated low dose diltiazem for many years. Instructed on what to watch for.   Today: Overall he is feeling good aside from not having as much energy as he did previously. He attributes this to his age and is not concerned.  He confirms he has been in Afib for about 22 years.   His blood work is completed at his PCP's office once a month, and is usually stable. His blood pressure is also monitored and typically similar to today's reading 126/74.  Exercises 4-5 times a week, including light weights and treadmill. This morning he used the treadmill for 30 minutes and had his heart rate above 100 bpm for most of the time. Also he walks frequently. He denies any limitations while exercising. He has an apple watch and keeps up with his steps, and is able to walk at least 3 miles at a time.  He denies any palpitations, chest pain, or shortness of breath. No lightheadedness, headaches, syncope, orthopnea, PND, or  lower extremity edema. No hematuria or hematochezia.   Past Medical History:  Diagnosis Date   Arthritis    Chronic atrial fibrillation ([place])    Dysrhythmia    History of radiation therapy    Hyperlipidemia    Hypertension    Prostate cancer [facility])     Past Surgical History:  Procedure Laterality Date   HERNIA REPAIR     X2    Current Medications: Current Outpatient Medications on File Prior to Visit  Medication Sig   Ascorbic Acid (VITA-C PO) Take by mouth.   Calcium Citrate (CITRACAL PO) Take 600 mg by mouth 2 (two) times daily.     diltiazem (CARDIZEM CD) 120 MG 24 hr capsule Take 1 capsule (120 mg total) by mouth daily.   glucose blood (ONETOUCH VERIO) test strip Use to check blood sugar 2 times a day as directed Dx E11.4   Lancets (ONETOUCH ULTRASOFT) lancets use for OneTouch Ultra meter to test up to bid   losartan (COZAAR) 100 MG tablet Take 100 mg by mouth daily.     rosuvastatin (CRESTOR) 5 MG tablet Take 5 mg by mouth daily.     SHINGRIX injection    VITAMIN D, ERGOCALCIFEROL, PO Take 1 tablet by mouth daily.    warfarin (COUMADIN) 5 MG tablet daily. Take 10 mg Monday, Friday. Take 7.5 mg all other days.   zoledronic acid (RECLAST) 5 MG/100ML SOLN injection Infuse 5mg   IV once a year (start February 2018)   No current facility-administered medications on file prior to visit.     Allergies:   Atorvastatin, Ezetimibe, and Fenofibrate   Social History   Tobacco Use   Smoking status: Never   Smokeless tobacco: Never  Vaping Use   Vaping Use: Never used  Substance Use Topics   Alcohol use: No   Drug use: No    Family History: family history includes Stroke (age of onset: 91) in his mother.  ROS:   Please see the history of present illness. (+) Fatigue All other systems are reviewed and negative.     EKGs/Labs/Other Studies Reviewed:    The following studies were reviewed today:  Echo 07/07/2019:  1. Left ventricular ejection fraction, by visual  estimation, is 60 to  65%. The left ventricle has normal function. There is no left ventricular  hypertrophy.   2. Global right ventricle has normal systolic function.The right  ventricular size is normal. No increase in right ventricular wall  thickness.   3. Left atrial size was severely dilated.   4. Right atrial size was severely dilated.   5. Mild mitral annular calcification.   6. Moderate thickening of the mitral valve leaflet(s).   7. The mitral valve is abnormal. Moderate mitral valve regurgitation.   8. MR is at least moderate.   9. The tricuspid valve is grossly normal. Tricuspid valve regurgitation  mild-moderate.  10. The aortic valve is tricuspid. Aortic valve regurgitation is not  visualized. Mild aortic valve stenosis.  11. The pulmonic valve was thickened. Pulmonic valve regurgitation is not  visualized.  12. Mildly elevated pulmonary artery systolic pressure.   EKG:  EKG is personally reviewed.   08/01/2021: atrial fibrillation at 66 bpm, iRBBB, nonspecific ST changes. 07/03/2019: atrial fibrillation, iRBBB, nonspecific ST changes, HR 69 bpm  Recent Labs: No results found for requested labs within last 8760 hours.   Recent Lipid Panel    Component Value Date/Time   CHOL 125 01/02/2011 0946   TRIG 121.0 01/02/2011 0946   HDL 37.40 (L) 01/02/2011 0946   CHOLHDL 3 01/02/2011 0946   VLDL 24.2 01/02/2011 0946   LDLCALC 63 01/02/2011 0946    Physical Exam:    VS:  BP 126/74   Pulse 66   Ht 6\' 1"  (1.854 m)   Wt 177 lb 1.6 oz (80.3 kg)   SpO2 98%   BMI 23.37 kg/m     Wt Readings from Last 3 Encounters:  08/01/21 177 lb 1.6 oz (80.3 kg)  07/30/20 181 lb 6.4 oz (82.3 kg)  03/10/20 170 lb (77.1 kg)    GEN: Well nourished, well developed in no acute distress HEENT: Normal, moist mucous membranes NECK: No JVD CARDIAC: Irregularly irregular, normal S1 and S2, no rubs or gallops. 2-3/6 harsh SM and 2/6 holosystolic murmur VASCULAR: Radial and DP pulses 2+  bilaterally. No carotid bruits RESPIRATORY:  Clear to auscultation without rales, wheezing or rhonchi  ABDOMEN: Soft, non-tender, non-distended MUSCULOSKELETAL:  Ambulates independently SKIN: Warm and dry, no edema NEUROLOGIC:  Alert and oriented x 3. No focal neuro deficits noted. PSYCHIATRIC:  Normal affect    ASSESSMENT:    1. Nonrheumatic aortic valve stenosis   2. Nonrheumatic mitral valve regurgitation   3. Permanent atrial fibrillation ([place])   4. Hypercoagulable state ([place])   5. Long term (current) use of anticoagulants     PLAN:    Murmur: harsh, systolic, best heard at LUSB but audible throughout precordium.  Not previously documented, no recent echo. Suspect both aortic stenosis and mitral regurgitation component. With new fatigue, will rule out structural issue as potential cause of his fatigue. -echocardiogram ordered  Permanent atrial fibrillation: rate controlled on diltiazem, long term anticoagulation with coumadin -CHA2DS2/VAS Stroke Risk Points=4. Discussed pros/cons of changing to DOAC today, we both agreed that continuing coumadin at this time is reasonable given how well he has done with it. -single episode of bradycardia, self limited. Can augment HR with exercise appropriately. No bradycardia today. Instructed on red flag warning signs, but otherwise would continue current medications.  Cardiac risk counseling and prevention recommendations: -recommend heart healthy/Mediterranean diet, with whole grains, fruits, vegetable, fish, lean meats, nuts, and olive oil. Limit salt. -recommend moderate walking, 3-5 times/week for 30-50 minutes each session. Aim for at least 150 minutes.week. Goal should be pace of 3 miles/hours, or walking 1.5 miles in 30 minutes -recommend avoidance of tobacco products. Avoid excess alcohol.  Plan for follow up: 1 year or sooner as needed, following repeat ultrasound.  Medication Adjustments/Labs and Tests Ordered: Current medicines are  reviewed at length with the patient today.  Concerns regarding medicines are outlined above.   Orders Placed This Encounter  Procedures   EKG 12-Lead   ECHOCARDIOGRAM COMPLETE   No orders of the defined types were placed in this encounter.  Patient Instructions  Apixaban (Eliquis) is tier 3 on your coverage.   I,Mathew Stumpf,acting as a Education administrator for [employer], MD.,have documented all relevant documentation on the behalf of Buford Dresser, MD,as directed by  Buford Dresser, MD while in the presence of Buford Dresser, MD.  I, Buford Dresser, MD, have reviewed all documentation for this visit. The documentation on 09/13/21 for the exam, diagnosis, procedures, and orders are all accurate and complete.   Signed, Buford Dresser, MD PhD 08/01/2021   [place]

## 2021-08-01 NOTE — Patient Instructions (Signed)
Medication Instructions:  Your Physician recommend you continue on your current medication as directed.    Apixaban (Eliquis) is tier 3 on your coverage.  *If you need a refill on your cardiac medications before your next appointment, please call your pharmacy*   Lab Work: None ordered today   Testing/Procedures: Your physician has requested that you have an echocardiogram in 1 year. Echocardiography is a painless test that uses sound waves to create images of your heart. It provides your doctor with information about the size and shape of your heart and how well your heart's chambers and valves are working. This procedure takes approximately one hour. There are no restrictions for this procedure. [place], you and your health needs are our priority.  As part of our continuing mission to provide you with exceptional heart care, we have created designated Provider Care Teams.  These Care Teams include your primary Cardiologist (physician) and Advanced Practice Providers (APPs -  Physician Assistants and Nurse Practitioners) who all work together to provide you with the care you need, when you need it.  We recommend signing up for the patient portal called "MyChart".  Sign up information is provided on this After Visit Summary.  MyChart is used to connect with patients for Virtual Visits (Telemedicine).  Patients are able to view lab/test results, encounter notes, upcoming appointments, etc.  Non-urgent messages can be sent to your provider as well.   To learn more about what you can do with MyChart, go to [email or web address].    Your next appointment:   1 year(s)  The format for your next appointment:   In Person  Provider:   Buford Dresser, MD

## 2021-08-30 DIAGNOSIS — D6869 Other thrombophilia: Secondary | ICD-10-CM | POA: Diagnosis not present

## 2021-08-30 DIAGNOSIS — I480 Paroxysmal atrial fibrillation: Secondary | ICD-10-CM | POA: Diagnosis not present

## 2021-08-30 DIAGNOSIS — Z7901 Long term (current) use of anticoagulants: Secondary | ICD-10-CM | POA: Diagnosis not present

## 2021-09-08 DIAGNOSIS — Z8546 Personal history of malignant neoplasm of prostate: Secondary | ICD-10-CM | POA: Diagnosis not present

## 2021-09-08 DIAGNOSIS — E291 Testicular hypofunction: Secondary | ICD-10-CM | POA: Diagnosis not present

## 2021-09-08 DIAGNOSIS — Z7901 Long term (current) use of anticoagulants: Secondary | ICD-10-CM | POA: Diagnosis not present

## 2021-09-08 DIAGNOSIS — E1140 Type 2 diabetes mellitus with diabetic neuropathy, unspecified: Secondary | ICD-10-CM | POA: Diagnosis not present

## 2021-09-08 DIAGNOSIS — E1129 Type 2 diabetes mellitus with other diabetic kidney complication: Secondary | ICD-10-CM | POA: Diagnosis not present

## 2021-09-08 DIAGNOSIS — I480 Paroxysmal atrial fibrillation: Secondary | ICD-10-CM | POA: Diagnosis not present

## 2021-09-08 DIAGNOSIS — I340 Nonrheumatic mitral (valve) insufficiency: Secondary | ICD-10-CM | POA: Diagnosis not present

## 2021-09-08 DIAGNOSIS — I10 Essential (primary) hypertension: Secondary | ICD-10-CM | POA: Diagnosis not present

## 2021-09-08 DIAGNOSIS — D696 Thrombocytopenia, unspecified: Secondary | ICD-10-CM | POA: Diagnosis not present

## 2021-09-08 DIAGNOSIS — D6869 Other thrombophilia: Secondary | ICD-10-CM | POA: Diagnosis not present

## 2021-09-08 DIAGNOSIS — E785 Hyperlipidemia, unspecified: Secondary | ICD-10-CM | POA: Diagnosis not present

## 2021-09-13 ENCOUNTER — Encounter (HOSPITAL_BASED_OUTPATIENT_CLINIC_OR_DEPARTMENT_OTHER): Payer: Self-pay | Admitting: Cardiology

## 2021-10-04 DIAGNOSIS — Z7901 Long term (current) use of anticoagulants: Secondary | ICD-10-CM | POA: Diagnosis not present

## 2021-10-04 DIAGNOSIS — D6869 Other thrombophilia: Secondary | ICD-10-CM | POA: Diagnosis not present

## 2021-10-04 DIAGNOSIS — I480 Paroxysmal atrial fibrillation: Secondary | ICD-10-CM | POA: Diagnosis not present

## 2021-11-03 DIAGNOSIS — I480 Paroxysmal atrial fibrillation: Secondary | ICD-10-CM | POA: Diagnosis not present

## 2021-11-03 DIAGNOSIS — D6869 Other thrombophilia: Secondary | ICD-10-CM | POA: Diagnosis not present

## 2021-11-03 DIAGNOSIS — Z7901 Long term (current) use of anticoagulants: Secondary | ICD-10-CM | POA: Diagnosis not present

## 2021-11-07 DIAGNOSIS — E119 Type 2 diabetes mellitus without complications: Secondary | ICD-10-CM | POA: Diagnosis not present

## 2021-11-08 DIAGNOSIS — E119 Type 2 diabetes mellitus without complications: Secondary | ICD-10-CM | POA: Diagnosis not present

## 2021-11-25 DIAGNOSIS — K219 Gastro-esophageal reflux disease without esophagitis: Secondary | ICD-10-CM | POA: Diagnosis not present

## 2021-11-25 DIAGNOSIS — Z7901 Long term (current) use of anticoagulants: Secondary | ICD-10-CM | POA: Diagnosis not present

## 2021-11-25 DIAGNOSIS — R071 Chest pain on breathing: Secondary | ICD-10-CM | POA: Diagnosis not present

## 2021-11-25 DIAGNOSIS — I480 Paroxysmal atrial fibrillation: Secondary | ICD-10-CM | POA: Diagnosis not present

## 2021-11-25 DIAGNOSIS — I10 Essential (primary) hypertension: Secondary | ICD-10-CM | POA: Diagnosis not present

## 2021-11-25 DIAGNOSIS — S20219A Contusion of unspecified front wall of thorax, initial encounter: Secondary | ICD-10-CM | POA: Diagnosis not present

## 2021-12-06 DIAGNOSIS — Z7901 Long term (current) use of anticoagulants: Secondary | ICD-10-CM | POA: Diagnosis not present

## 2021-12-06 DIAGNOSIS — D6869 Other thrombophilia: Secondary | ICD-10-CM | POA: Diagnosis not present

## 2021-12-06 DIAGNOSIS — I480 Paroxysmal atrial fibrillation: Secondary | ICD-10-CM | POA: Diagnosis not present

## 2021-12-15 DIAGNOSIS — L812 Freckles: Secondary | ICD-10-CM | POA: Diagnosis not present

## 2021-12-15 DIAGNOSIS — Z85828 Personal history of other malignant neoplasm of skin: Secondary | ICD-10-CM | POA: Diagnosis not present

## 2021-12-15 DIAGNOSIS — L821 Other seborrheic keratosis: Secondary | ICD-10-CM | POA: Diagnosis not present

## 2021-12-15 DIAGNOSIS — L570 Actinic keratosis: Secondary | ICD-10-CM | POA: Diagnosis not present

## 2021-12-15 DIAGNOSIS — L820 Inflamed seborrheic keratosis: Secondary | ICD-10-CM | POA: Diagnosis not present

## 2021-12-15 DIAGNOSIS — D1801 Hemangioma of skin and subcutaneous tissue: Secondary | ICD-10-CM | POA: Diagnosis not present

## 2022-01-02 DIAGNOSIS — M25511 Pain in right shoulder: Secondary | ICD-10-CM | POA: Diagnosis not present

## 2022-01-02 DIAGNOSIS — M25512 Pain in left shoulder: Secondary | ICD-10-CM | POA: Diagnosis not present

## 2022-01-05 DIAGNOSIS — I480 Paroxysmal atrial fibrillation: Secondary | ICD-10-CM | POA: Diagnosis not present

## 2022-01-05 DIAGNOSIS — Z7901 Long term (current) use of anticoagulants: Secondary | ICD-10-CM | POA: Diagnosis not present

## 2022-01-05 DIAGNOSIS — D6869 Other thrombophilia: Secondary | ICD-10-CM | POA: Diagnosis not present

## 2022-01-06 DIAGNOSIS — M25511 Pain in right shoulder: Secondary | ICD-10-CM | POA: Diagnosis not present

## 2022-01-06 DIAGNOSIS — M25512 Pain in left shoulder: Secondary | ICD-10-CM | POA: Diagnosis not present

## 2022-02-07 DIAGNOSIS — H35373 Puckering of macula, bilateral: Secondary | ICD-10-CM | POA: Diagnosis not present

## 2022-02-07 DIAGNOSIS — H04123 Dry eye syndrome of bilateral lacrimal glands: Secondary | ICD-10-CM | POA: Diagnosis not present

## 2022-02-07 DIAGNOSIS — H40013 Open angle with borderline findings, low risk, bilateral: Secondary | ICD-10-CM | POA: Diagnosis not present

## 2022-02-07 DIAGNOSIS — H25813 Combined forms of age-related cataract, bilateral: Secondary | ICD-10-CM | POA: Diagnosis not present

## 2022-02-09 DIAGNOSIS — I480 Paroxysmal atrial fibrillation: Secondary | ICD-10-CM | POA: Diagnosis not present

## 2022-02-09 DIAGNOSIS — Z7901 Long term (current) use of anticoagulants: Secondary | ICD-10-CM | POA: Diagnosis not present

## 2022-02-09 DIAGNOSIS — D6869 Other thrombophilia: Secondary | ICD-10-CM | POA: Diagnosis not present

## 2022-02-27 DIAGNOSIS — Z125 Encounter for screening for malignant neoplasm of prostate: Secondary | ICD-10-CM | POA: Diagnosis not present

## 2022-02-27 DIAGNOSIS — E291 Testicular hypofunction: Secondary | ICD-10-CM | POA: Diagnosis not present

## 2022-02-27 DIAGNOSIS — E785 Hyperlipidemia, unspecified: Secondary | ICD-10-CM | POA: Diagnosis not present

## 2022-02-27 DIAGNOSIS — E1140 Type 2 diabetes mellitus with diabetic neuropathy, unspecified: Secondary | ICD-10-CM | POA: Diagnosis not present

## 2022-02-27 DIAGNOSIS — I10 Essential (primary) hypertension: Secondary | ICD-10-CM | POA: Diagnosis not present

## 2022-02-28 DIAGNOSIS — E119 Type 2 diabetes mellitus without complications: Secondary | ICD-10-CM | POA: Diagnosis not present

## 2022-03-06 DIAGNOSIS — E291 Testicular hypofunction: Secondary | ICD-10-CM | POA: Diagnosis not present

## 2022-03-06 DIAGNOSIS — Z0000 Encounter for general adult medical examination without abnormal findings: Secondary | ICD-10-CM | POA: Diagnosis not present

## 2022-03-06 DIAGNOSIS — Z1331 Encounter for screening for depression: Secondary | ICD-10-CM | POA: Diagnosis not present

## 2022-03-06 DIAGNOSIS — M810 Age-related osteoporosis without current pathological fracture: Secondary | ICD-10-CM | POA: Diagnosis not present

## 2022-03-06 DIAGNOSIS — D6869 Other thrombophilia: Secondary | ICD-10-CM | POA: Diagnosis not present

## 2022-03-06 DIAGNOSIS — I480 Paroxysmal atrial fibrillation: Secondary | ICD-10-CM | POA: Diagnosis not present

## 2022-03-06 DIAGNOSIS — I340 Nonrheumatic mitral (valve) insufficiency: Secondary | ICD-10-CM | POA: Diagnosis not present

## 2022-03-06 DIAGNOSIS — Z1339 Encounter for screening examination for other mental health and behavioral disorders: Secondary | ICD-10-CM | POA: Diagnosis not present

## 2022-03-06 DIAGNOSIS — R82998 Other abnormal findings in urine: Secondary | ICD-10-CM | POA: Diagnosis not present

## 2022-03-06 DIAGNOSIS — E1140 Type 2 diabetes mellitus with diabetic neuropathy, unspecified: Secondary | ICD-10-CM | POA: Diagnosis not present

## 2022-03-06 DIAGNOSIS — E1129 Type 2 diabetes mellitus with other diabetic kidney complication: Secondary | ICD-10-CM | POA: Diagnosis not present

## 2022-03-06 DIAGNOSIS — D696 Thrombocytopenia, unspecified: Secondary | ICD-10-CM | POA: Diagnosis not present

## 2022-03-06 DIAGNOSIS — I10 Essential (primary) hypertension: Secondary | ICD-10-CM | POA: Diagnosis not present

## 2022-03-06 DIAGNOSIS — E785 Hyperlipidemia, unspecified: Secondary | ICD-10-CM | POA: Diagnosis not present

## 2022-03-06 DIAGNOSIS — Z7901 Long term (current) use of anticoagulants: Secondary | ICD-10-CM | POA: Diagnosis not present

## 2022-04-11 ENCOUNTER — Ambulatory Visit: Payer: Medicare HMO | Admitting: Podiatry

## 2022-04-11 ENCOUNTER — Encounter: Payer: Self-pay | Admitting: Podiatry

## 2022-04-11 DIAGNOSIS — D2372 Other benign neoplasm of skin of left lower limb, including hip: Secondary | ICD-10-CM

## 2022-04-11 NOTE — Progress Notes (Signed)
He presents today chief complaint of a painful callus to the plantar aspect of the forefoot left he states that he does a lot of walking on the treadmill and he is afraid that his come up from that.  States that he has tried over-the-counter padding which has not really helped.  Objective: Vital signs stable he is alert and oriented x3 there is no erythema edema cellulitis drainage or odor he has a 1 mm benign skin lesion beneath the base of the fourth toe left foot.  This is porokeratotic in nature.  There is no erythema edema cellulitis drainage or odor associated with it.  Assessment: Benign skin lesion forefoot left.  Plan: Enucleated the lesion today placed Salinocaine under occlusion to be left on for 3 days and then washed off thoroughly he will notify with any questions or concerns.  Follow-up with me as needed.

## 2022-04-14 DIAGNOSIS — I480 Paroxysmal atrial fibrillation: Secondary | ICD-10-CM | POA: Diagnosis not present

## 2022-04-14 DIAGNOSIS — D6869 Other thrombophilia: Secondary | ICD-10-CM | POA: Diagnosis not present

## 2022-04-14 DIAGNOSIS — Z7901 Long term (current) use of anticoagulants: Secondary | ICD-10-CM | POA: Diagnosis not present

## 2022-05-18 DIAGNOSIS — I480 Paroxysmal atrial fibrillation: Secondary | ICD-10-CM | POA: Diagnosis not present

## 2022-05-18 DIAGNOSIS — D6869 Other thrombophilia: Secondary | ICD-10-CM | POA: Diagnosis not present

## 2022-05-18 DIAGNOSIS — Z7901 Long term (current) use of anticoagulants: Secondary | ICD-10-CM | POA: Diagnosis not present

## 2022-05-31 DIAGNOSIS — M25551 Pain in right hip: Secondary | ICD-10-CM | POA: Diagnosis not present

## 2022-05-31 DIAGNOSIS — M545 Low back pain, unspecified: Secondary | ICD-10-CM | POA: Diagnosis not present

## 2022-05-31 DIAGNOSIS — M5136 Other intervertebral disc degeneration, lumbar region: Secondary | ICD-10-CM | POA: Diagnosis not present

## 2022-05-31 DIAGNOSIS — M5134 Other intervertebral disc degeneration, thoracic region: Secondary | ICD-10-CM | POA: Diagnosis not present

## 2022-06-09 ENCOUNTER — Other Ambulatory Visit (HOSPITAL_COMMUNITY): Payer: Self-pay | Admitting: Internal Medicine

## 2022-06-09 ENCOUNTER — Ambulatory Visit (HOSPITAL_COMMUNITY)
Admission: RE | Admit: 2022-06-09 | Discharge: 2022-06-09 | Disposition: A | Discharge status: Home / Self Care | Payer: Medicare HMO | Source: Ambulatory Visit | Attending: Internal Medicine | Admitting: Internal Medicine

## 2022-06-09 ENCOUNTER — Other Ambulatory Visit: Payer: Self-pay | Admitting: Internal Medicine

## 2022-06-09 DIAGNOSIS — S22080A Wedge compression fracture of T11-T12 vertebra, initial encounter for closed fracture: Secondary | ICD-10-CM | POA: Insufficient documentation

## 2022-06-09 DIAGNOSIS — S32010A Wedge compression fracture of first lumbar vertebra, initial encounter for closed fracture: Secondary | ICD-10-CM | POA: Diagnosis not present

## 2022-06-15 DIAGNOSIS — L812 Freckles: Secondary | ICD-10-CM | POA: Diagnosis not present

## 2022-06-15 DIAGNOSIS — L821 Other seborrheic keratosis: Secondary | ICD-10-CM | POA: Diagnosis not present

## 2022-06-15 DIAGNOSIS — Z85828 Personal history of other malignant neoplasm of skin: Secondary | ICD-10-CM | POA: Diagnosis not present

## 2022-06-15 DIAGNOSIS — L820 Inflamed seborrheic keratosis: Secondary | ICD-10-CM | POA: Diagnosis not present

## 2022-06-15 DIAGNOSIS — D1801 Hemangioma of skin and subcutaneous tissue: Secondary | ICD-10-CM | POA: Diagnosis not present

## 2022-06-15 DIAGNOSIS — L570 Actinic keratosis: Secondary | ICD-10-CM | POA: Diagnosis not present

## 2022-06-19 ENCOUNTER — Telehealth (HOSPITAL_BASED_OUTPATIENT_CLINIC_OR_DEPARTMENT_OTHER): Payer: Self-pay | Admitting: Cardiology

## 2022-06-19 DIAGNOSIS — M5416 Radiculopathy, lumbar region: Secondary | ICD-10-CM | POA: Diagnosis not present

## 2022-06-19 DIAGNOSIS — S22080A Wedge compression fracture of T11-T12 vertebra, initial encounter for closed fracture: Secondary | ICD-10-CM | POA: Diagnosis not present

## 2022-06-19 NOTE — Telephone Encounter (Signed)
Left message for patient to call and discuss rescheduling the  07/18/22 Echocardiogram

## 2022-06-20 DIAGNOSIS — M810 Age-related osteoporosis without current pathological fracture: Secondary | ICD-10-CM | POA: Diagnosis not present

## 2022-06-20 DIAGNOSIS — D6869 Other thrombophilia: Secondary | ICD-10-CM | POA: Diagnosis not present

## 2022-06-20 DIAGNOSIS — Z7901 Long term (current) use of anticoagulants: Secondary | ICD-10-CM | POA: Diagnosis not present

## 2022-06-20 DIAGNOSIS — Z79899 Other long term (current) drug therapy: Secondary | ICD-10-CM | POA: Diagnosis not present

## 2022-06-20 DIAGNOSIS — I480 Paroxysmal atrial fibrillation: Secondary | ICD-10-CM | POA: Diagnosis not present

## 2022-06-23 ENCOUNTER — Other Ambulatory Visit (HOSPITAL_COMMUNITY): Payer: Self-pay | Admitting: *Deleted

## 2022-06-23 ENCOUNTER — Ambulatory Visit: Payer: Medicare HMO | Attending: Neurosurgery

## 2022-06-23 ENCOUNTER — Other Ambulatory Visit: Payer: Self-pay

## 2022-06-23 DIAGNOSIS — R293 Abnormal posture: Secondary | ICD-10-CM | POA: Diagnosis not present

## 2022-06-23 DIAGNOSIS — R262 Difficulty in walking, not elsewhere classified: Secondary | ICD-10-CM | POA: Insufficient documentation

## 2022-06-23 DIAGNOSIS — R252 Cramp and spasm: Secondary | ICD-10-CM | POA: Diagnosis not present

## 2022-06-23 DIAGNOSIS — X58XXXA Exposure to other specified factors, initial encounter: Secondary | ICD-10-CM | POA: Insufficient documentation

## 2022-06-23 DIAGNOSIS — M5459 Other low back pain: Secondary | ICD-10-CM

## 2022-06-23 DIAGNOSIS — M545 Low back pain, unspecified: Secondary | ICD-10-CM | POA: Insufficient documentation

## 2022-06-23 DIAGNOSIS — M79604 Pain in right leg: Secondary | ICD-10-CM | POA: Insufficient documentation

## 2022-06-23 DIAGNOSIS — S22080A Wedge compression fracture of T11-T12 vertebra, initial encounter for closed fracture: Secondary | ICD-10-CM | POA: Diagnosis not present

## 2022-06-23 DIAGNOSIS — M6281 Muscle weakness (generalized): Secondary | ICD-10-CM

## 2022-06-23 NOTE — Addendum Note (Signed)
Addended by: Candyce Churn B on: 06/23/2022 11:59 AM   Modules accepted: Orders

## 2022-06-23 NOTE — Therapy (Addendum)
OUTPATIENT PHYSICAL THERAPY THORACOLUMBAR EVALUATION   Patient Name: Henry Holland MRN: [id number] DOB:Sep 18, 1932, 88 y.o., male Today's Date: 06/23/2022   PT End of Session - 06/23/22 0903     Visit Number 1    Date for PT Re-Evaluation 08/18/22    Authorization Type AET[employer] MEDICARE    PT Start Time 0845    PT Stop Time 0932    PT Time Calculation (min) 47 min             Past Medical History:  Diagnosis Date   Arthritis    Chronic atrial fibrillation ([place])    Dysrhythmia    History of radiation therapy    Hyperlipidemia    Hypertension    Prostate cancer [facility])    Past Surgical History:  Procedure Laterality Date   HERNIA REPAIR     X2   Patient Active Problem List   Diagnosis Date Noted   Lumbar spondylosis 01/03/2021   Encounter for general adult medical examination without abnormal findings 10/14/2020   Hypercoagulable state ([place]) 10/14/2020   Long term (current) use of anticoagulants 10/14/2020   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   Lumbar radiculopathy 08/27/2018   Rupture of right rotator cuff 11/22/2015   Diabetic renal disease ([place]) 09/23/2012   Acquired hallux valgus 09/05/2012   Thrombocytopenia ([place]) 09/11/2011   Localized, primary osteoarthritis of hand 05/12/2011   Hypertension    Chronic atrial fibrillation ([facility])    Prostate cancer ([place])    Hyperlipidemia    History of malignant neoplasm of prostate 07/20/2010   Age-related osteoporosis without current pathological fracture 10/25/2009   Diabetic peripheral neuropathy associated with type 2 diabetes mellitus ([place]) 10/25/2009   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   Mitral valve disorder 08/28/1958    PCP: Ginger Organ., MD  REFERRING PROVIDER: Consuella Lose, MD   REFERRING DIAG: S22.080A T12 compression fracture  Rationale for Evaluation and Treatment: Rehabilitation  THERAPY DIAG:  Other low back pain  Pain in right leg  Abnormal posture  Cramp and  spasm  Difficulty in walking, not elsewhere classified  Muscle weakness (generalized)  ONSET DATE: 06/22/2022   SUBJECTIVE:                                                                                                                                                                                           SUBJECTIVE STATEMENT: Patient states he was lifting his golden doodle into the car a couple of weeks ago and felt a pop.  He has experienced low back pain in the past but he has been feeling more extreme pain and now having pain  in the right leg along with weakness.  He was evaluated by neurologist and MRI confirms T12 compression fracture along with multilevel stenosis.  He is retired and enjoys getting out and walking his dog.  He still does work around the house including using riding Therapist, music to [employer] his lawn.    PERTINENT HISTORY:  Hx of L1 compression fracture  PAIN:  Are you having pain? Yes: NPRS scale: 7/10 Pain location: low back and right leg Pain description: aching/sharp Aggravating factors: standing Relieving factors: lying down  PRECAUTIONS: Fall  WEIGHT BEARING RESTRICTIONS: No  FALLS:  Has patient fallen in last 6 months? Yes. Number of falls 1  LIVING ENVIRONMENT: Lives with: lives with their spouse Lives in: House/apartment Stairs: Yes: Internal: 12 steps; can reach both and External: 4 steps; can reach both Has following equipment at home: None  OCCUPATION: Retired  PLOF: Independent, Independent with basic ADLs, Independent with household mobility without device, Independent with community mobility without device, Independent with homemaking with ambulation, Independent with gait, and Independent with transfers  PATIENT GOALS: to be able to do my usual activities without pain   OBJECTIVE:   DIAGNOSTIC FINDINGS:  IMPRESSION: 1. Age indeterminate but possibly subacute wedge compression fracture of T12 with approximately 50% height loss and moderate  bone marrow edema. No retropulsion. 2. Severe spinal canal stenosis and bilateral neural foraminal stenosis at L4-L5 due to combination of disc bulge, synovial cyst and facet arthrosis. 3. Moderate spinal canal stenosis and moderate right neural foraminal stenosis at L3-L4. 4. Severe right and moderate left L2-L3 neural foraminal stenosis  PATIENT SURVEYS:  FOTO complete next visit  SCREENING FOR RED FLAGS: Bowel or bladder incontinence: No Spinal tumors: No Cauda equina syndrome: No Compression fracture: No Abdominal aneurysm: No  COGNITION: Overall cognitive status: Within functional limits for tasks assessed     SENSATION: WFL  MUSCLE LENGTH: Hamstrings: Right 50 deg; Left 50 deg Thomas test: Right pos ; Left pos   POSTURE: rounded shoulders, forward head, decreased lumbar lordosis, increased thoracic kyphosis, and posterior pelvic tilt   LUMBAR ROM:   AROM eval  Flexion WNL  Extension 25%  Right lateral flexion Fingertips to mid thigh  Left lateral flexion Fingertips to mid thigh  Right rotation Immediate pain - limited to 25%  Left rotation 50%   (Blank rows = not tested)  LOWER EXTREMITY ROM:     WNL  LOWER EXTREMITY MMT:    Generally 4+/5 with exception of right hip flexion 3/5, right knee extension 3+/5, right hip ext 3/5, right hip abd 3/5  LUMBAR SPECIAL TESTS:  Straight leg raise test: Negative  FUNCTIONAL TESTS:  5 times sit to stand: 24.07 sec Timed up and go (TUG): 13.00  GAIT: Distance walked: 30 Assistive device utilized: None Level of assistance: Complete Independence Comments: antalgic  TODAY'S TREATMENT:  DATE: 06/23/2022  Evaluation completed and initiated HEP   PATIENT EDUCATION:  Education details: Initiated HEP, educated on anatomy of the spine and the nature of his condition Person educated:  Patient Education method: Consulting civil engineer, Media planner, Verbal cues, and Handouts Education comprehension: verbalized understanding, returned demonstration, verbal cues required, and needs further education  HOME EXERCISE PROGRAM: Access Code: [email or web address] URL: [email or web address] Date: 06/23/2022 Prepared by: Candyce Churn  Exercises - Standing Hamstring Stretch on Chair  - 1 x daily - 7 x weekly - 1 sets - 3 reps - 30 hold - Quadricep Stretch with Chair and Counter Support  - 1 x daily - 7 x weekly - 1 sets - 3 reps - 30 hold - Supine Piriformis Stretch with Foot on Ground  - 1 x daily - 7 x weekly - 1 sets - 3 reps - 30 hold - Supine Figure 4 Piriformis Stretch  - 1 x daily - 7 x weekly - 1 sets - 3 reps - 30 hold - Lower Trunk Rotations  - 1 x daily - 7 x weekly - 1 sets - 10 reps  ASSESSMENT:  CLINICAL IMPRESSION: Patient is a 88 y.o. male who was seen today for physical therapy evaluation and treatment for T12 compression fracture and right leg pain due to stenosis.  He presents with limited lumbar rotation, poor LE flexibility, core weakness and elevated pain.  He would benefit from LE flexibility, core strengthening and pain control.  He  hopes to gain control of his pain and be able to continue walking his dog, going out and about with his wife and doing his outdoor yard work.    OBJECTIVE IMPAIRMENTS: decreased activity tolerance, decreased knowledge of condition, decreased mobility, difficulty walking, decreased ROM, decreased strength, decreased safety awareness, increased fascial restrictions, increased muscle spasms, impaired flexibility, and pain.   ACTIVITY LIMITATIONS: carrying, lifting, bending, sitting, standing, squatting, sleeping, stairs, transfers, bed mobility, and dressing  PARTICIPATION LIMITATIONS: meal prep, cleaning, laundry, shopping, community activity, and yard work  PERSONAL FACTORS: Age, Fitness, and Past/current experiences are also affecting  patient's functional outcome.   REHAB POTENTIAL: Good  CLINICAL DECISION MAKING: Stable/uncomplicated  EVALUATION COMPLEXITY: Low   GOALS: Goals reviewed with patient? Yes  SHORT TERM GOALS: Target date: 07/21/2022  Patient will be independent with initial HEP  Baseline: Goal status: INITIAL  2.  Pain report to be no greater than 4/10  Baseline: 7/10 Goal status: INITIAL   LONG TERM GOALS: Target date: 08/18/2022  Patient to be independent with advanced HEP  Baseline:  Goal status: INITIAL  2.  Patient to report pain no greater than 2/10  Baseline: 7/10 Goal status: INITIAL  3.  Functional tests to improve by 5 seconds (5 times sit to stand), and 3 seconds TUG Baseline:  Goal status: INITIAL  4.  Patient to be able to walk 1 mile without needing to stop and rest due to back pain Baseline:  Goal status: INITIAL  5.  Radicular symptoms in leg to be centralized Baseline:  Goal status: INITIAL  6.  Patient to be able to resume his outdoor chores including mowing his lawn Baseline:  Goal status: INITIAL  PLAN:  PT FREQUENCY: 2x/week  PT DURATION: 8 weeks  PLANNED INTERVENTIONS: Therapeutic exercises, Therapeutic activity, Neuromuscular re-education, Balance training, Gait training, Patient/Family education, Self Care, Joint mobilization, Stair training, DME instructions, Aquatic Therapy, Dry Needling, Electrical stimulation, Cryotherapy, Moist heat, Taping, Ultrasound, Ionotophoresis '4mg'$ /ml Dexamethasone, Manual therapy, and Re-evaluation.  PLAN FOR NEXT SESSION: Review  HEP, NuStep, add in core stabilization, complete FOTO   Hayla Hinger B. Maygen Sirico, PT 06/23/22 11:55 AM   [facility] [address], [place], [place] [address] Phone # [phone] Fax [phone]

## 2022-06-26 ENCOUNTER — Ambulatory Visit (HOSPITAL_COMMUNITY)
Admission: RE | Admit: 2022-06-26 | Discharge: 2022-06-26 | Disposition: A | Discharge status: Home / Self Care | Payer: Medicare HMO | Source: Ambulatory Visit | Attending: Internal Medicine | Admitting: Internal Medicine

## 2022-06-26 DIAGNOSIS — M810 Age-related osteoporosis without current pathological fracture: Secondary | ICD-10-CM | POA: Diagnosis not present

## 2022-06-26 MED ORDER — ZOLEDRONIC ACID 5 MG/100ML IV SOLN
INTRAVENOUS | Status: AC
  Filled 2022-06-26: qty 100

## 2022-06-26 MED ORDER — ZOLEDRONIC ACID 5 MG/100ML IV SOLN
5.0000 mg | Freq: Once | INTRAVENOUS | Status: AC
  Administered 2022-06-26: 5 mg via INTRAVENOUS

## 2022-06-27 ENCOUNTER — Telehealth: Payer: Self-pay

## 2022-06-29 ENCOUNTER — Ambulatory Visit: Payer: Medicare HMO | Attending: Neurosurgery

## 2022-06-29 DIAGNOSIS — M5459 Other low back pain: Secondary | ICD-10-CM | POA: Diagnosis not present

## 2022-06-29 DIAGNOSIS — R293 Abnormal posture: Secondary | ICD-10-CM | POA: Insufficient documentation

## 2022-06-29 DIAGNOSIS — R262 Difficulty in walking, not elsewhere classified: Secondary | ICD-10-CM | POA: Diagnosis not present

## 2022-06-29 DIAGNOSIS — M79604 Pain in right leg: Secondary | ICD-10-CM | POA: Insufficient documentation

## 2022-06-29 DIAGNOSIS — M6281 Muscle weakness (generalized): Secondary | ICD-10-CM | POA: Diagnosis not present

## 2022-06-29 DIAGNOSIS — R252 Cramp and spasm: Secondary | ICD-10-CM | POA: Insufficient documentation

## 2022-06-29 NOTE — Therapy (Signed)
OUTPATIENT PHYSICAL THERAPY TREATMENT   Patient Name: Henry Holland MRN: [id number] DOB:Jul 08, 1933, 88 y.o., male Today's Date: 06/29/2022   PT End of Session - 06/29/22 0929     Visit Number 2    Date for PT Re-Evaluation 08/18/22    Authorization Type AETNA MEDICARE    PT Start Time 1696    PT Stop Time 0930    PT Time Calculation (min) 43 min              Past Medical History:  Diagnosis Date   Arthritis    Chronic atrial fibrillation ([place])    Dysrhythmia    History of radiation therapy    Hyperlipidemia    Hypertension    Prostate cancer [facility]    Past Surgical History:  Procedure Laterality Date   HERNIA REPAIR     X2   Patient Active Problem List   Diagnosis Date Noted   Lumbar spondylosis 01/03/2021   Encounter for general adult medical examination without abnormal findings 10/14/2020   Hypercoagulable state ([place]) 10/14/2020   Long term (current) use of anticoagulants 10/14/2020   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   Lumbar radiculopathy 08/27/2018   Rupture of right rotator cuff 11/22/2015   Diabetic renal disease ([place]) 09/23/2012   Acquired hallux valgus 09/05/2012   Thrombocytopenia ([place]) 09/11/2011   Localized, primary osteoarthritis of hand 05/12/2011   Hypertension    Chronic atrial fibrillation ([facility])    Prostate cancer ([place])    Hyperlipidemia    History of malignant neoplasm of prostate 07/20/2010   Age-related osteoporosis without current pathological fracture 10/25/2009   Diabetic peripheral neuropathy associated with type 2 diabetes mellitus ([place]) 10/25/2009   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   Mitral valve disorder 08/28/1958    PCP: Ginger Organ., MD  REFERRING PROVIDER: Consuella Lose, MD   REFERRING DIAG: S22.080A T12 compression fracture  Rationale for Evaluation and Treatment: Rehabilitation  THERAPY DIAG:  Other low back pain  Pain in right leg  Abnormal posture  Cramp and spasm  Muscle  weakness (generalized)  Difficulty in walking, not elsewhere classified  ONSET DATE: 06/22/2022   SUBJECTIVE:                                                                                                                                                                                           SUBJECTIVE STATEMENT: I am having Rt LE pain today.  I have been too lazy to do my exercises    PERTINENT HISTORY:  Hx of L1 compression fracture  PAIN:  Are you having pain? Yes: NPRS scale: 7/10 Pain location: low  back and right leg Pain description: aching/sharp Aggravating factors: standing Relieving factors: lying down  PRECAUTIONS: Fall  WEIGHT BEARING RESTRICTIONS: No  FALLS:  Has patient fallen in last 6 months? Yes. Number of falls 1  LIVING ENVIRONMENT: Lives with: lives with their spouse Lives in: House/apartment Stairs: Yes: Internal: 12 steps; can reach both and External: 4 steps; can reach both Has following equipment at home: None  OCCUPATION: Retired  PLOF: Independent, Independent with basic ADLs, Independent with household mobility without device, Independent with community mobility without device, Independent with homemaking with ambulation, Independent with gait, and Independent with transfers  PATIENT GOALS: to be able to do my usual activities without pain   OBJECTIVE:   DIAGNOSTIC FINDINGS:  IMPRESSION: 1. Age indeterminate but possibly subacute wedge compression fracture of T12 with approximately 50% height loss and moderate bone marrow edema. No retropulsion. 2. Severe spinal canal stenosis and bilateral neural foraminal stenosis at L4-L5 due to combination of disc bulge, synovial cyst and facet arthrosis. 3. Moderate spinal canal stenosis and moderate right neural foraminal stenosis at L3-L4. 4. Severe right and moderate left L2-L3 neural foraminal stenosis  PATIENT SURVEYS:  06/29/22 FOTO: 70 (goal is 73)  SCREENING FOR RED FLAGS: Bowel or  bladder incontinence: No Spinal tumors: No Cauda equina syndrome: No Compression fracture: No Abdominal aneurysm: No  COGNITION: Overall cognitive status: Within functional limits for tasks assessed     SENSATION: WFL  MUSCLE LENGTH: Hamstrings: Right 50 deg; Left 50 deg Thomas test: Right pos ; Left pos   POSTURE: rounded shoulders, forward head, decreased lumbar lordosis, increased thoracic kyphosis, and posterior pelvic tilt   LUMBAR ROM:   AROM eval  Flexion WNL  Extension 25%  Right lateral flexion Fingertips to mid thigh  Left lateral flexion Fingertips to mid thigh  Right rotation Immediate pain - limited to 25%  Left rotation 50%   (Blank rows = not tested)  LOWER EXTREMITY ROM:     WNL  LOWER EXTREMITY MMT:    Generally 4+/5 with exception of right hip flexion 3/5, right knee extension 3+/5, right hip ext 3/5, right hip abd 3/5  LUMBAR SPECIAL TESTS:  Straight leg raise test: Negative  FUNCTIONAL TESTS:  5 times sit to stand: 24.07 sec Timed up and go (TUG): 13.00  GAIT: Distance walked: 30 Assistive device utilized: None Level of assistance: Complete Independence Comments: antalgic  TODAY'S TREATMENT:           Date: 06/29/22 NuStep: Level 2x 8 minutes   Seated hamstring stretch 3x20 seconds  Standing quad stretch on chair 3x20 seconds                                                                                                                        DATE: 06/23/2022  Evaluation completed and initiated HEP   PATIENT EDUCATION:  Education details: Initiated HEP, educated on anatomy of the spine and the nature of his condition Person educated: Patient Education method: Consulting civil engineer, Demonstration,  Verbal cues, and Handouts Education comprehension: verbalized understanding, returned demonstration, verbal cues required, and needs further education  HOME EXERCISE PROGRAM: Access Code: [email or web address] URL: [email or web address] Date:  06/23/2022 Prepared by: Candyce Churn  Exercises - Standing Hamstring Stretch on Chair  - 1 x daily - 7 x weekly - 1 sets - 3 reps - 30 hold - Quadricep Stretch with Chair and Counter Support  - 1 x daily - 7 x weekly - 1 sets - 3 reps - 30 hold - Supine Piriformis Stretch with Foot on Ground  - 1 x daily - 7 x weekly - 1 sets - 3 reps - 30 hold - Supine Figure 4 Piriformis Stretch  - 1 x daily - 7 x weekly - 1 sets - 3 reps - 30 hold - Lower Trunk Rotations  - 1 x daily - 7 x weekly - 1 sets - 10 reps  ASSESSMENT:  CLINICAL IMPRESSION: First time follow-up after evaluation. Pt arrived with 7/10 Rt LE pain and reports minimal compliance with HEP.  Session spent reviewing HEP and PT educated pt on importance of compliance to help to reduce his symptoms.  Pt demonstrated frustration regarding his continued symptoms after injury .  PT monitored pt throughout session and discussed pain with each activity.  Pain was not elevated after treatment today.  Patient will benefit from skilled PT to address the below impairments and improve overall function.   OBJECTIVE IMPAIRMENTS: decreased activity tolerance, decreased knowledge of condition, decreased mobility, difficulty walking, decreased ROM, decreased strength, decreased safety awareness, increased fascial restrictions, increased muscle spasms, impaired flexibility, and pain.   ACTIVITY LIMITATIONS: carrying, lifting, bending, sitting, standing, squatting, sleeping, stairs, transfers, bed mobility, and dressing  PARTICIPATION LIMITATIONS: meal prep, cleaning, laundry, shopping, community activity, and yard work  PERSONAL FACTORS: Age, Fitness, and Past/current experiences are also affecting patient's functional outcome.   REHAB POTENTIAL: Good  CLINICAL DECISION MAKING: Stable/uncomplicated  EVALUATION COMPLEXITY: Low   GOALS: Goals reviewed with patient? Yes  SHORT TERM GOALS: Target date: 07/21/2022  Patient will be independent with  initial HEP  Baseline: Goal status: INITIAL  2.  Pain report to be no greater than 4/10  Baseline: 7/10 Goal status: INITIAL   LONG TERM GOALS: Target date: 08/18/2022  Patient to be independent with advanced HEP  Baseline:  Goal status: INITIAL  2.  Patient to report pain no greater than 2/10  Baseline: 7/10 Goal status: INITIAL  3.  Functional tests to improve by 5 seconds (5 times sit to stand), and 3 seconds TUG Baseline:  Goal status: INITIAL  4.  Patient to be able to walk 1 mile without needing to stop and rest due to back pain Baseline:  Goal status: INITIAL  5.  Radicular symptoms in leg to be centralized Baseline:  Goal status: INITIAL  6.  Patient to be able to resume his outdoor chores including mowing his lawn Baseline:  Goal status: INITIAL  PLAN:  PT FREQUENCY: 2x/week  PT DURATION: 8 weeks  PLANNED INTERVENTIONS: Therapeutic exercises, Therapeutic activity, Neuromuscular re-education, Balance training, Gait training, Patient/Family education, Self Care, Joint mobilization, Stair training, DME instructions, Aquatic Therapy, Dry Needling, Electrical stimulation, Cryotherapy, Moist heat, Taping, Ultrasound, Ionotophoresis '4mg'$ /ml Dexamethasone, Manual therapy, and Re-evaluation.  PLAN FOR NEXT SESSION: Nu Step, core strength, flexibility, manual to address pain.  DN to low back   Sigurd Sos, PT 06/29/22 9:32 AM   [facility] [address], [place], [place] [address] Phone # [phone]  Fax [phone]

## 2022-07-10 ENCOUNTER — Ambulatory Visit (INDEPENDENT_AMBULATORY_CARE_PROVIDER_SITE_OTHER): Payer: Medicare HMO

## 2022-07-10 DIAGNOSIS — C61 Malignant neoplasm of prostate: Secondary | ICD-10-CM | POA: Diagnosis not present

## 2022-07-10 DIAGNOSIS — I350 Nonrheumatic aortic (valve) stenosis: Secondary | ICD-10-CM | POA: Diagnosis not present

## 2022-07-10 LAB — ECHOCARDIOGRAM COMPLETE
AR max vel: 1.09 cm2
AV Area VTI: 0.94 cm2
AV Area mean vel: 0.94 cm2
AV Mean grad: 15 mmHg
AV Peak grad: 24.4 mmHg
AV Vena cont: 0.34 cm
Ao pk vel: 2.47 m/s
Area-P 1/2: 3.77 cm2
MV M vel: 5.37 m/s
MV Peak grad: 115.3 mmHg
Radius: 0.4 cm
S' Lateral: 2.92 cm

## 2022-07-11 ENCOUNTER — Ambulatory Visit: Payer: Medicare HMO

## 2022-07-11 DIAGNOSIS — R293 Abnormal posture: Secondary | ICD-10-CM | POA: Diagnosis not present

## 2022-07-11 DIAGNOSIS — M79604 Pain in right leg: Secondary | ICD-10-CM | POA: Diagnosis not present

## 2022-07-11 DIAGNOSIS — M6281 Muscle weakness (generalized): Secondary | ICD-10-CM | POA: Diagnosis not present

## 2022-07-11 DIAGNOSIS — M5459 Other low back pain: Secondary | ICD-10-CM | POA: Diagnosis not present

## 2022-07-11 DIAGNOSIS — R252 Cramp and spasm: Secondary | ICD-10-CM | POA: Diagnosis not present

## 2022-07-11 DIAGNOSIS — R262 Difficulty in walking, not elsewhere classified: Secondary | ICD-10-CM

## 2022-07-11 NOTE — Therapy (Signed)
OUTPATIENT PHYSICAL THERAPY TREATMENT   Patient Name: Henry Holland MRN: [id number] DOB:1932-12-24, 88 y.o., male Today's Date: 07/11/2022   PT End of Session - 07/11/22 0857     Visit Number 3    Date for PT Re-Evaluation 08/18/22    Authorization Type AET[employer] MEDICARE    PT Start Time 0847    PT Stop Time 0930    PT Time Calculation (min) 43 min    Activity Tolerance Patient tolerated treatment well    Behavior During Therapy [facility] for tasks assessed/performed              Past Medical History:  Diagnosis Date   Arthritis    Chronic atrial fibrillation ([place])    Dysrhythmia    History of radiation therapy    Hyperlipidemia    Hypertension    Prostate cancer [facility])    Past Surgical History:  Procedure Laterality Date   HERNIA REPAIR     X2   Patient Active Problem List   Diagnosis Date Noted   Lumbar spondylosis 01/03/2021   Encounter for general adult medical examination without abnormal findings 10/14/2020   Hypercoagulable state ([place]) 10/14/2020   Long term (current) use of anticoagulants 10/14/2020   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   Lumbar radiculopathy 08/27/2018   Rupture of right rotator cuff 11/22/2015   Diabetic renal disease ([place]) 09/23/2012   Acquired hallux valgus 09/05/2012   Thrombocytopenia ([place]) 09/11/2011   Localized, primary osteoarthritis of hand 05/12/2011   Hypertension    Chronic atrial fibrillation (H[facility])    Prostate cancer ([place])    Hyperlipidemia    History of malignant neoplasm of prostate 07/20/2010   Age-related osteoporosis without current pathological fracture 10/25/2009   Diabetic peripheral neuropathy associated with type 2 diabetes mellitus ([place]) 10/25/2009   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   Mitral valve disorder 08/28/1958    PCP: Ginger Organ., MD  REFERRING PROVIDER: Consuella Lose, MD   REFERRING DIAG: S22.080A T12 compression fracture  Rationale for Evaluation and Treatment:  Rehabilitation  THERAPY DIAG:  Other low back pain  Pain in right leg  Abnormal posture  Difficulty in walking, not elsewhere classified  Muscle weakness (generalized)  Cramp and spasm  ONSET DATE: 06/22/2022   SUBJECTIVE:                                                                                                                                                                                           SUBJECTIVE STATEMENT: I am doing better buy my right leg still hurts.     PERTINENT HISTORY:  Hx of L1 compression fracture  PAIN:  Are you having pain? Yes: NPRS scale: 7/10 Pain location: low back and right leg Pain description: aching/sharp Aggravating factors: standing Relieving factors: lying down  PRECAUTIONS: Fall  WEIGHT BEARING RESTRICTIONS: No  FALLS:  Has patient fallen in last 6 months? Yes. Number of falls 1  LIVING ENVIRONMENT: Lives with: lives with their spouse Lives in: House/apartment Stairs: Yes: Internal: 12 steps; can reach both and External: 4 steps; can reach both Has following equipment at home: None  OCCUPATION: Retired  PLOF: Independent, Independent with basic ADLs, Independent with household mobility without device, Independent with community mobility without device, Independent with homemaking with ambulation, Independent with gait, and Independent with transfers  PATIENT GOALS: to be able to do my usual activities without pain   OBJECTIVE:   DIAGNOSTIC FINDINGS:  IMPRESSION: 1. Age indeterminate but possibly subacute wedge compression fracture of T12 with approximately 50% height loss and moderate bone marrow edema. No retropulsion. 2. Severe spinal canal stenosis and bilateral neural foraminal stenosis at L4-L5 due to combination of disc bulge, synovial cyst and facet arthrosis. 3. Moderate spinal canal stenosis and moderate right neural foraminal stenosis at L3-L4. 4. Severe right and moderate left L2-L3 neural  foraminal stenosis  PATIENT SURVEYS:  06/29/22 FOTO: 1 (goal is 58)  SCREENING FOR RED FLAGS: Bowel or bladder incontinence: No Spinal tumors: No Cauda equina syndrome: No Compression fracture: No Abdominal aneurysm: No  COGNITION: Overall cognitive status: Within functional limits for tasks assessed     SENSATION: WFL  MUSCLE LENGTH: Hamstrings: Right 50 deg; Left 50 deg Thomas test: Right pos ; Left pos   POSTURE: rounded shoulders, forward head, decreased lumbar lordosis, increased thoracic kyphosis, and posterior pelvic tilt   LUMBAR ROM:   AROM eval  Flexion WNL  Extension 25%  Right lateral flexion Fingertips to mid thigh  Left lateral flexion Fingertips to mid thigh  Right rotation Immediate pain - limited to 25%  Left rotation 50%   (Blank rows = not tested)  LOWER EXTREMITY ROM:     WNL  LOWER EXTREMITY MMT:    Generally 4+/5 with exception of right hip flexion 3/5, right knee extension 3+/5, right hip ext 3/5, right hip abd 3/5  LUMBAR SPECIAL TESTS:  Straight leg raise test: Negative  FUNCTIONAL TESTS:  5 times sit to stand: 24.07 sec Timed up and go (TUG): 13.00  GAIT: Distance walked: 30 Assistive device utilized: None Level of assistance: Complete Independence Comments: antalgic  TODAY'S TREATMENT:           Date: 06/29/22 NuStep: Level 2x 8 minutes   Standing hamstring stretch 2 x 30 seconds both Standing quad stretch on chair 2 x 30 seconds both Seated piriformis stretch 2 x 30 sec both Hook lying trunk rotation x 5 each side hold 10 sec each Combo hip IR/ER stretch x 5 each side hold 10 sec each Butterfly stretch x 3 hold 10 sec each  PPT x 20 PPT with 90/90 heel tap x 20 PPT with alternating arms and legs x 20  Date: 06/29/22 NuStep: Level 2x 8 minutes   Seated hamstring stretch 3x20 seconds  Standing quad stretch on chair 3x20 seconds  DATE: 06/23/2022  Evaluation completed and initiated HEP   PATIENT EDUCATION:  Education details: Initiated HEP, educated on anatomy of the spine and the nature of his condition Person educated: Patient Education method: Consulting civil engineer, Media planner, Verbal cues, and Handouts Education comprehension: verbalized understanding, returned demonstration, verbal cues required, and needs further education  HOME EXERCISE PROGRAM: Access Code: [email or web address] URL: [email or web address] Date: 06/23/2022 Prepared by: Candyce Churn  Exercises - Standing Hamstring Stretch on Chair  - 1 x daily - 7 x weekly - 1 sets - 3 reps - 30 hold - Quadricep Stretch with Chair and Counter Support  - 1 x daily - 7 x weekly - 1 sets - 3 reps - 30 hold - Supine Piriformis Stretch with Foot on Ground  - 1 x daily - 7 x weekly - 1 sets - 3 reps - 30 hold - Supine Figure 4 Piriformis Stretch  - 1 x daily - 7 x weekly - 1 sets - 3 reps - 30 hold - Lower Trunk Rotations  - 1 x daily - 7 x weekly - 1 sets - 10 reps  ASSESSMENT:  CLINICAL IMPRESSION: Patient seems to be responding to the stretches and gaining flexibility as he was able to do functional tasks at home such as tie his shoes and put on pants and wash his feet and legs with less strain.   He completed all tasks today with no complaints of pain but he needed constant reminders to finish the stretch or the exercise.  He often stops in the middle of the exercise to inquire "what's next".  He admits he is non compliant with his HEP except for laying on his bed and stretching the back of his leg.  Patient will benefit from skilled PT to address the below impairments and improve overall function.   OBJECTIVE IMPAIRMENTS: decreased activity tolerance, decreased knowledge of condition, decreased mobility, difficulty walking, decreased ROM, decreased strength, decreased safety awareness, increased fascial restrictions, increased muscle  spasms, impaired flexibility, and pain.   ACTIVITY LIMITATIONS: carrying, lifting, bending, sitting, standing, squatting, sleeping, stairs, transfers, bed mobility, and dressing  PARTICIPATION LIMITATIONS: meal prep, cleaning, laundry, shopping, community activity, and yard work  PERSONAL FACTORS: Age, Fitness, and Past/current experiences are also affecting patient's functional outcome.   REHAB POTENTIAL: Good  CLINICAL DECISION MAKING: Stable/uncomplicated  EVALUATION COMPLEXITY: Low   GOALS: Goals reviewed with patient? Yes  SHORT TERM GOALS: Target date: 07/21/2022  Patient will be independent with initial HEP  Baseline: Goal status: INITIAL  2.  Pain report to be no greater than 4/10  Baseline: 7/10 Goal status: INITIAL   LONG TERM GOALS: Target date: 08/18/2022  Patient to be independent with advanced HEP  Baseline:  Goal status: INITIAL  2.  Patient to report pain no greater than 2/10  Baseline: 7/10 Goal status: INITIAL  3.  Functional tests to improve by 5 seconds (5 times sit to stand), and 3 seconds TUG Baseline:  Goal status: INITIAL  4.  Patient to be able to walk 1 mile without needing to stop and rest due to back pain Baseline:  Goal status: INITIAL  5.  Radicular symptoms in leg to be centralized Baseline:  Goal status: INITIAL  6.  Patient to be able to resume his outdoor chores including mowing his lawn Baseline:  Goal status: INITIAL  PLAN:  PT FREQUENCY: 2x/week  PT DURATION: 8 weeks  PLANNED INTERVENTIONS: Therapeutic exercises, Therapeutic activity, Neuromuscular re-education, Balance training, Gait training, Patient/Family education, Self Care, Joint  mobilization, Stair training, DME instructions, Aquatic Therapy, Dry Needling, Electrical stimulation, Cryotherapy, Moist heat, Taping, Ultrasound, Ionotophoresis '4mg'$ /ml Dexamethasone, Manual therapy, and Re-evaluation.  PLAN FOR NEXT SESSION: Nu Step, core strength, flexibility,  manual to address pain.  DN to low back.   Anderson Malta B. Keni Elison, PT 07/11/22 12:01 PM   [facility] [address], [place] [address] [place], [place] [address] Phone # [phone] Fax [phone]

## 2022-07-13 ENCOUNTER — Telehealth: Payer: Self-pay | Admitting: *Deleted

## 2022-07-13 NOTE — Telephone Encounter (Signed)
   Pre-operative Risk Assessment    Patient Name: Henry Holland  DOB: Nov 23, 1932 MRN: [id number]      Request for Surgical Clearance    Procedure:   EPIDURAL STEROID INJECTION  Date of Surgery:  Clearance TBD                                 Surgeon:  DR. [place] Surgeon's Group or Practice Name:  [facility] Phone number:  [phone] Fax number:  [phone]   Type of Clearance Requested:   - Pharmacy:  Hold Warfarin (Coumadin) X'S 5 DAYS   Type of Anesthesia:  Not Indicated   Additional requests/questions:    Astrid Divine   07/13/2022, 4:06 PM

## 2022-07-14 DIAGNOSIS — Z8546 Personal history of malignant neoplasm of prostate: Secondary | ICD-10-CM | POA: Diagnosis not present

## 2022-07-14 DIAGNOSIS — E291 Testicular hypofunction: Secondary | ICD-10-CM | POA: Diagnosis not present

## 2022-07-14 NOTE — Telephone Encounter (Signed)
1st attempt to reach pt regarding surgical clearance and the need for BMET & CBC.  No answer / no machine.

## 2022-07-14 NOTE — Telephone Encounter (Signed)
Patient with diagnosis of atrial fibrillation on warfarin for anticoagulation.    Procedure: ESI Date of procedure: TBD   CHA2DS2-VASc Score = 4   This indicates a 4.8% annual risk of stroke. The patient's score is based upon: CHF History: 0 HTN History: 1 Diabetes History: 1 Stroke History: 0 Vascular Disease History: 0 Age Score: 2 Gender Score: 0    No SCr or Platelet count in the past year (other labs current in [place]).  Please have patient get CBC and BMET for clearance

## 2022-07-17 ENCOUNTER — Ambulatory Visit: Payer: Medicare HMO

## 2022-07-17 DIAGNOSIS — M6281 Muscle weakness (generalized): Secondary | ICD-10-CM

## 2022-07-17 DIAGNOSIS — R252 Cramp and spasm: Secondary | ICD-10-CM | POA: Diagnosis not present

## 2022-07-17 DIAGNOSIS — R262 Difficulty in walking, not elsewhere classified: Secondary | ICD-10-CM | POA: Diagnosis not present

## 2022-07-17 DIAGNOSIS — M79604 Pain in right leg: Secondary | ICD-10-CM | POA: Diagnosis not present

## 2022-07-17 DIAGNOSIS — M5459 Other low back pain: Secondary | ICD-10-CM

## 2022-07-17 DIAGNOSIS — R293 Abnormal posture: Secondary | ICD-10-CM

## 2022-07-17 NOTE — Telephone Encounter (Signed)
Pt has appt 07/18/22 with Dr. Harrell Gave @ 9:20. Per pre op pharm-d the pt is going to need lab work, bmet, cbc.  Will forward notes to all parties involved.

## 2022-07-17 NOTE — Therapy (Signed)
OUTPATIENT PHYSICAL THERAPY TREATMENT   Patient Name: Henry Holland MRN: [id number] DOB:03/14/1933, 88 y.o., male Today's Date: 07/17/2022   PT End of Session - 07/17/22 1101     Visit Number 4    Date for PT Re-Evaluation 08/18/22    Authorization Type AET[employer] MEDICARE    PT Start Time 1100    PT Stop Time 1155    PT Time Calculation (min) 55 min    Activity Tolerance Patient tolerated treatment well    Behavior During Therapy WFL for tasks assessed/performed              Past Medical History:  Diagnosis Date   Arthritis    Chronic atrial fibrillation ([place])    Dysrhythmia    History of radiation therapy    Hyperlipidemia    Hypertension    Prostate cancer [facility])    Past Surgical History:  Procedure Laterality Date   HERNIA REPAIR     X2   Patient Active Problem List   Diagnosis Date Noted   Lumbar spondylosis 01/03/2021   Encounter for general adult medical examination without abnormal findings 10/14/2020   Hypercoagulable state ([place]) 10/14/2020   Long term (current) use of anticoagulants 10/14/2020   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   Lumbar radiculopathy 08/27/2018   Rupture of right rotator cuff 11/22/2015   Diabetic renal disease ([place]) 09/23/2012   Acquired hallux valgus 09/05/2012   Thrombocytopenia ([place]) 09/11/2011   Localized, primary osteoarthritis of hand 05/12/2011   Hypertension    Chronic atrial fibrillation ([facility])    Prostate cancer ([place])    Hyperlipidemia    History of malignant neoplasm of prostate 07/20/2010   Age-related osteoporosis without current pathological fracture 10/25/2009   Diabetic peripheral neuropathy associated with type 2 diabetes mellitus ([place]) 10/25/2009   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   Mitral valve disorder 08/28/1958    PCP: Ginger Organ., MD  REFERRING PROVIDER: Consuella Lose, MD   REFERRING DIAG: S22.080A T12 compression fracture  Rationale for Evaluation and Treatment:  Rehabilitation  THERAPY DIAG:  Other low back pain  Pain in right leg  Abnormal posture  Difficulty in walking, not elsewhere classified  Muscle weakness (generalized)  Cramp and spasm  ONSET DATE: 06/22/2022   SUBJECTIVE:                                                                                                                                                                                           SUBJECTIVE STATEMENT: I was in a lot of pain when I left here last time but I did a little better at home the next  few days.      PERTINENT HISTORY:  Hx of L1 compression fracture  PAIN:  Are you having pain? Yes: NPRS scale: 7/10 Pain location: low back and right leg Pain description: aching/sharp Aggravating factors: standing Relieving factors: lying down  PRECAUTIONS: Fall  WEIGHT BEARING RESTRICTIONS: No  FALLS:  Has patient fallen in last 6 months? Yes. Number of falls 1  LIVING ENVIRONMENT: Lives with: lives with their spouse Lives in: House/apartment Stairs: Yes: Internal: 12 steps; can reach both and External: 4 steps; can reach both Has following equipment at home: None  OCCUPATION: Retired  PLOF: Independent, Independent with basic ADLs, Independent with household mobility without device, Independent with community mobility without device, Independent with homemaking with ambulation, Independent with gait, and Independent with transfers  PATIENT GOALS: to be able to do my usual activities without pain   OBJECTIVE:   DIAGNOSTIC FINDINGS:  IMPRESSION: 1. Age indeterminate but possibly subacute wedge compression fracture of T12 with approximately 50% height loss and moderate bone marrow edema. No retropulsion. 2. Severe spinal canal stenosis and bilateral neural foraminal stenosis at L4-L5 due to combination of disc bulge, synovial cyst and facet arthrosis. 3. Moderate spinal canal stenosis and moderate right neural foraminal stenosis at  L3-L4. 4. Severe right and moderate left L2-L3 neural foraminal stenosis  PATIENT SURVEYS:  06/29/22 FOTO: 66 (goal is 39)  SCREENING FOR RED FLAGS: Bowel or bladder incontinence: No Spinal tumors: No Cauda equina syndrome: No Compression fracture: No Abdominal aneurysm: No  COGNITION: Overall cognitive status: Within functional limits for tasks assessed     SENSATION: WFL  MUSCLE LENGTH: Hamstrings: Right 50 deg; Left 50 deg Thomas test: Right pos ; Left pos   POSTURE: rounded shoulders, forward head, decreased lumbar lordosis, increased thoracic kyphosis, and posterior pelvic tilt   LUMBAR ROM:   AROM eval  Flexion WNL  Extension 25%  Right lateral flexion Fingertips to mid thigh  Left lateral flexion Fingertips to mid thigh  Right rotation Immediate pain - limited to 25%  Left rotation 50%   (Blank rows = not tested)  LOWER EXTREMITY ROM:     WNL  LOWER EXTREMITY MMT:    Generally 4+/5 with exception of right hip flexion 3/5, right knee extension 3+/5, right hip ext 3/5, right hip abd 3/5  LUMBAR SPECIAL TESTS:  Straight leg raise test: Negative  FUNCTIONAL TESTS:  5 times sit to stand: 24.07 sec Timed up and go (TUG): 13.00  GAIT: Distance walked: 30 Assistive device utilized: None Level of assistance: Complete Independence Comments: antalgic  TODAY'S TREATMENT:           Date: 07/17/22 NuStep: Level 2x 5 minutes   Trigger Point Dry-Needling  Treatment instructions: Expect mild to moderate muscle soreness. S/S of pneumothorax if dry needled over a lung field, and to seek immediate medical attention should they occur. Patient verbalized understanding of these instructions and education.  Patient Consent Given: Yes Education handout provided: Yes Muscles treated: lumbar multifidi Electrical stimulation performed: No Parameters: N/A Treatment response/outcome: Palpated lumbar spine area thoroughly for trigger points and tight bands, once located,  administered dry needling to these areas.  Patient report of decreased pain and mild twitch response  Supine hamstring stretch 5 x 10 sec each Supine IT band stretch 5 x 10 sec each Hook lying trunk rotation x 10 each side Butterfly stretch x 5 holding 10 sec each Open book stretch x 5 hold 10 sec each side PPT x 20 PPT with  90/90 heel taps x 20 PPT with dying bug x 20 Hook lying hip IR's stretch in figure 4 seated Ice to low back x 10 min in hook lying   Date: 06/29/22 NuStep: Level 2x 8 minutes   Standing hamstring stretch 2 x 30 seconds both Standing quad stretch on chair 2 x 30 seconds both Seated piriformis stretch 2 x 30 sec both Hook lying trunk rotation x 5 each side hold 10 sec each Combo hip IR/ER stretch x 5 each side hold 10 sec each Butterfly stretch x 3 hold 10 sec each  PPT x 20 PPT with 90/90 heel tap x 20 PPT with alternating arms and legs x 20  Date: 06/29/22 NuStep: Level 2x 8 minutes   Seated hamstring stretch 3x20 seconds  Standing quad stretch on chair 3x20 seconds                                                                                                                        DATE: 06/23/2022  Evaluation completed and initiated HEP   PATIENT EDUCATION:  Education details: Initiated HEP, educated on anatomy of the spine and the nature of his condition Person educated: Patient Education method: Consulting civil engineer, Media planner, Verbal cues, and Handouts Education comprehension: verbalized understanding, returned demonstration, verbal cues required, and needs further education  HOME EXERCISE PROGRAM: Access Code: [email or web address] URL: [email or web address] Date: 06/23/2022 Prepared by: Candyce Churn  Exercises - Standing Hamstring Stretch on Chair  - 1 x daily - 7 x weekly - 1 sets - 3 reps - 30 hold - Quadricep Stretch with Chair and Counter Support  - 1 x daily - 7 x weekly - 1 sets - 3 reps - 30 hold - Supine Piriformis Stretch with Foot  on Ground  - 1 x daily - 7 x weekly - 1 sets - 3 reps - 30 hold - Supine Figure 4 Piriformis Stretch  - 1 x daily - 7 x weekly - 1 sets - 3 reps - 30 hold - Lower Trunk Rotations  - 1 x daily - 7 x weekly - 1 sets - 10 reps  ASSESSMENT:  CLINICAL IMPRESSION: Sharron is progressing slowly but with slightly better compliance with his HEP.  He still needs encouragement to stay focused and complete all reps and count the full amount of time in a hold for stretching.  He was quite stiff and sore following session but this is typical of his start up pain.  Patient is scheduled for ESI in 2 weeks.  He will likely benefit from this.    Patient will benefit from skilled PT to address the below impairments and improve overall function.   OBJECTIVE IMPAIRMENTS: decreased activity tolerance, decreased knowledge of condition, decreased mobility, difficulty walking, decreased ROM, decreased strength, decreased safety awareness, increased fascial restrictions, increased muscle spasms, impaired flexibility, and pain.   ACTIVITY LIMITATIONS: carrying, lifting, bending, sitting, standing, squatting, sleeping, stairs, transfers, bed mobility, and dressing  PARTICIPATION LIMITATIONS: meal prep, cleaning, laundry, shopping,  community activity, and yard work  PERSONAL FACTORS: Age, Fitness, and Past/current experiences are also affecting patient's functional outcome.   REHAB POTENTIAL: Good  CLINICAL DECISION MAKING: Stable/uncomplicated  EVALUATION COMPLEXITY: Low   GOALS: Goals reviewed with patient? Yes  SHORT TERM GOALS: Target date: 07/21/2022  Patient will be independent with initial HEP  Baseline: Goal status: INITIAL  2.  Pain report to be no greater than 4/10  Baseline: 7/10 Goal status: INITIAL   LONG TERM GOALS: Target date: 08/18/2022  Patient to be independent with advanced HEP  Baseline:  Goal status: INITIAL  2.  Patient to report pain no greater than 2/10  Baseline: 7/10 Goal  status: INITIAL  3.  Functional tests to improve by 5 seconds (5 times sit to stand), and 3 seconds TUG Baseline:  Goal status: INITIAL  4.  Patient to be able to walk 1 mile without needing to stop and rest due to back pain Baseline:  Goal status: INITIAL  5.  Radicular symptoms in leg to be centralized Baseline:  Goal status: INITIAL  6.  Patient to be able to resume his outdoor chores including mowing his lawn Baseline:  Goal status: INITIAL  PLAN:  PT FREQUENCY: 2x/week  PT DURATION: 8 weeks  PLANNED INTERVENTIONS: Therapeutic exercises, Therapeutic activity, Neuromuscular re-education, Balance training, Gait training, Patient/Family education, Self Care, Joint mobilization, Stair training, DME instructions, Aquatic Therapy, Dry Needling, Electrical stimulation, Cryotherapy, Moist heat, Taping, Ultrasound, Ionotophoresis '4mg'$ /ml Dexamethasone, Manual therapy, and Re-evaluation.  PLAN FOR NEXT SESSION: Nu Step, core strength, flexibility, manual to address pain.  DN to low back if good results from this session.    Anderson Malta B. Clinten Howk, PT 07/17/22 12:02 PM   [facility] [address] [place], [place] [address] Phone # [phone] Fax [phone]

## 2022-07-18 ENCOUNTER — Encounter (HOSPITAL_BASED_OUTPATIENT_CLINIC_OR_DEPARTMENT_OTHER): Payer: Self-pay | Admitting: Cardiology

## 2022-07-18 ENCOUNTER — Other Ambulatory Visit (HOSPITAL_BASED_OUTPATIENT_CLINIC_OR_DEPARTMENT_OTHER): Payer: Medicare HMO

## 2022-07-18 ENCOUNTER — Ambulatory Visit (HOSPITAL_BASED_OUTPATIENT_CLINIC_OR_DEPARTMENT_OTHER): Payer: Medicare HMO | Admitting: Cardiology

## 2022-07-18 VITALS — BP 144/72 | HR 79 | Ht 73.0 in | Wt 172.6 lb

## 2022-07-18 DIAGNOSIS — I350 Nonrheumatic aortic (valve) stenosis: Secondary | ICD-10-CM | POA: Diagnosis not present

## 2022-07-18 DIAGNOSIS — I340 Nonrheumatic mitral (valve) insufficiency: Secondary | ICD-10-CM

## 2022-07-18 DIAGNOSIS — Z7901 Long term (current) use of anticoagulants: Secondary | ICD-10-CM

## 2022-07-18 DIAGNOSIS — I4821 Permanent atrial fibrillation: Secondary | ICD-10-CM

## 2022-07-18 DIAGNOSIS — D6859 Other primary thrombophilia: Secondary | ICD-10-CM

## 2022-07-18 LAB — BASIC METABOLIC PANEL
BUN/Creatinine Ratio: 20 (ref 10–24)
BUN: 16 mg/dL (ref 8–27)
CO2: 25 mmol/L (ref 20–29)
Calcium: 9.9 mg/dL (ref 8.6–10.2)
Chloride: 97 mmol/L (ref 96–106)
Creatinine, Ser: 0.79 mg/dL (ref 0.76–1.27)
Glucose: 145 mg/dL — ABNORMAL HIGH (ref 70–99)
Potassium: 4.5 mmol/L (ref 3.5–5.2)
Sodium: 137 mmol/L (ref 134–144)
eGFR: 86 mL/min/{1.73_m2} (ref >59)

## 2022-07-18 LAB — CBC
Hematocrit: 38.3 % (ref 37.5–51.0)
Hemoglobin: 13.3 g/dL (ref 13.0–17.7)
MCH: 33.7 pg — ABNORMAL HIGH (ref 26.6–33.0)
MCHC: 34.7 g/dL (ref 31.5–35.7)
MCV: 97 fL (ref 79–97)
Platelets: 143 10*3/uL — ABNORMAL LOW (ref 150–450)
RBC: 3.95 x10E6/uL — ABNORMAL LOW (ref 4.14–5.80)
RDW: 12.4 % (ref 11.6–15.4)
WBC: 5.7 10*3/uL (ref 3.4–10.8)

## 2022-07-18 NOTE — Patient Instructions (Signed)
Medication Instructions:  The current medical regimen is effective;  continue present plan and medications.  *If you need a refill on your cardiac medications before your next appointment, please call your pharmacy*   Lab Work: CBC, BMET today   If you have labs (blood work) drawn today and your tests are completely normal, you will receive your results only by: [place] (if you have MyChart) OR A paper copy in the mail If you have any lab test that is abnormal or we need to change your treatment, we will call you to review the results.   Testing/Procedures: Echocardiogram (12 month)  - Your physician has requested that you have an echocardiogram. Echocardiography is a painless test that uses sound waves to create images of your heart. It provides your doctor with information about the size and shape of your heart and how well your heart's chambers and valves are working. This procedure takes approximately one hour. There are no restrictions for this procedure.     Follow-Up: At [facility], you and your health needs are our priority.  As part of our continuing mission to provide you with exceptional heart care, we have created designated Provider Care Teams.  These Care Teams include your primary Cardiologist (physician) and Advanced Practice Providers (APPs -  Physician Assistants and Nurse Practitioners) who all work together to provide you with the care you need, when you need it.  We recommend signing up for the patient portal called "MyChart".  Sign up information is provided on this After Visit Summary.  MyChart is used to connect with patients for Virtual Visits (Telemedicine).  Patients are able to view lab/test results, encounter notes, upcoming appointments, etc.  Non-urgent messages can be sent to your provider as well.   To learn more about what you can do with MyChart, go to [email or web address].    Your next appointment:   12 month(s)  The format for  your next appointment:   In Person  Provider:   Buford Dresser, MD

## 2022-07-18 NOTE — Progress Notes (Signed)
Cardiology Office Note:    Date:  07/19/2022   ID:  Henry Holland, DOB Dec 07, 1932, MRN [id number]  PCP:  Ginger Organ., MD  Cardiologist:  Buford Dresser, MD  Referring MD: Ginger Organ., MD   CC: follow up  History of Present Illness:    Henry Holland is a 88 y.o. male with a hx of permanent atrial fibrillation on coumadin, hypertension who is seen for follow up today.   Atrial fibrillation: permanent, since 2002. Tolerating coumadin well, no significant bruising or bleeding.   Today: Here for cardiovascular preoperative assessment. Per patient, it is just coumadin planning for epidural spinal injection. Per pharmacy, he needs updated labs, re-ordered today.   About 6 weeks ago, was lifting his 80 lb dog and felt a pop in his back. Has had continued pain since then. Hopeful that injection will help. Still able to mow yard on the riding mower but has limited his activity in general.   No bleeding issues. Feels he is doing well on coumadin.  We reviewed his echo today. Moderate MR. AS with variable numbers, overall likely moderate AS but cannot exclude low flow low gradient AS. No symptoms, reviewed what to watch for today.   Denies chest pain, shortness of breath at rest or with normal exertion. No PND, orthopnea, LE edema or unexpected weight gain. No syncope or palpitations.   Past Medical History:  Diagnosis Date   Arthritis    Chronic atrial fibrillation ([place])    Dysrhythmia    History of radiation therapy    Hyperlipidemia    Hypertension    Prostate cancer [facility])     Past Surgical History:  Procedure Laterality Date   HERNIA REPAIR     X2    Current Medications: Current Outpatient Medications on File Prior to Visit  Medication Sig   Ascorbic Acid (VITA-C PO) Take by mouth.   Calcium Citrate (CITRACAL PO) Take 600 mg by mouth 2 (two) times daily.     diltiazem (CARDIZEM CD) 120 MG 24 hr capsule Take 1 capsule (120 mg total) by mouth daily.    glucose blood (ONETOUCH VERIO) test strip Use to check blood sugar 2 times a day as directed Dx E11.4   Lancets (ONETOUCH ULTRASOFT) lancets use for OneTouch Ultra meter to test up to bid   losartan (COZAAR) 100 MG tablet Take 100 mg by mouth daily.     rosuvastatin (CRESTOR) 5 MG tablet Take 5 mg by mouth daily.     SHINGRIX injection    VITAMIN D PO Take by mouth.   VITAMIN D, ERGOCALCIFEROL, PO Take 1 tablet by mouth daily.    warfarin (COUMADIN) 5 MG tablet daily. Take 10 mg Monday, Friday. Take 7.5 mg all other days.   zoledronic acid (RECLAST) 5 MG/100ML SOLN injection Infuse '5mg'$  IV once a year (start February 2018)   No current facility-administered medications on file prior to visit.     Allergies:   Atorvastatin, Ezetimibe, and Fenofibrate   Social History   Tobacco Use   Smoking status: Never   Smokeless tobacco: Never  Vaping Use   Vaping Use: Never used  Substance Use Topics   Alcohol use: No   Drug use: No    Family History: family history includes Stroke (age of onset: 17) in his mother.  ROS:   Please see the history of present illness. All other systems are reviewed and negative.     EKGs/Labs/Other Studies Reviewed:  The following studies were reviewed today:  Echo 07/10/22  1. Left ventricular ejection fraction, by estimation, is 55 to 60%. Left  ventricular ejection fraction by 3D volume is 59 %. The left ventricle has normal function. The left ventricle has no regional wall motion  abnormalities. There is mild concentric left ventricular hypertrophy. Left ventricular diastolic function could not be evaluated.   2. Right ventricular systolic function is mildly reduced. The right  ventricular size is normal. There is mildly elevated pulmonary artery  systolic pressure. The estimated right ventricular systolic pressure is  09.8 mmHg.   3. Left atrial size was severely dilated.   4. Right atrial size was severely dilated.   5. The mitral valve is  degenerative. Mild to moderate mitral valve  regurgitation. No evidence of mitral stenosis. Severe mitral annular  calcification.   6. The aortic valve is tricuspid. There is severe calcifcation of the  aortic valve. There is severe thickening of the aortic valve. Aortic valve regurgitation is not visualized. Moderate to severe aortic valve stenosis.  Aortic valve area, by VTI measures 0.94 cm. Aortic valve mean gradient measures 15.0 mmHg. Aortic valve Vmax measures 2.47 m/s. The SVI is low at 20 and DVI is 30. Findings consistent with at least moderate and likely moderate to severe paradoxical low flow low gradient aortic stenosis.   7. The inferior vena cava is dilated in size with >50% respiratory  variability, suggesting right atrial pressure of 8 mmHg.   8. Consider TEE to get AV planimetry if clinically indicated.   Echo 07/07/2019:  1. Left ventricular ejection fraction, by visual estimation, is 60 to  65%. The left ventricle has normal function. There is no left ventricular hypertrophy.   2. Global right ventricle has normal systolic function.The right  ventricular size is normal. No increase in right ventricular wall  thickness.   3. Left atrial size was severely dilated.   4. Right atrial size was severely dilated.   5. Mild mitral annular calcification.   6. Moderate thickening of the mitral valve leaflet(s).   7. The mitral valve is abnormal. Moderate mitral valve regurgitation.   8. MR is at least moderate.   9. The tricuspid valve is grossly normal. Tricuspid valve regurgitation  mild-moderate.  10. The aortic valve is tricuspid. Aortic valve regurgitation is not  visualized. Mild aortic valve stenosis.  11. The pulmonic valve was thickened. Pulmonic valve regurgitation is not  visualized.  12. Mildly elevated pulmonary artery systolic pressure.   EKG:  EKG is personally reviewed.   07/18/22: atrial fibrillation with PVC, 79 bpm, iRBBB, nonspecific ST pattern 08/01/2021:  atrial fibrillation at 66 bpm, iRBBB, nonspecific ST changes. 07/03/2019: atrial fibrillation, iRBBB, nonspecific ST changes, HR 69 bpm  Recent Labs: 07/18/2022: BUN 16; Creatinine, Ser 0.79; Hemoglobin 13.3; Platelets 143; Potassium 4.5; Sodium 137   Recent Lipid Panel    Component Value Date/Time   CHOL 125 01/02/2011 0946   TRIG 121.0 01/02/2011 0946   HDL 37.40 (L) 01/02/2011 0946   CHOLHDL 3 01/02/2011 0946   VLDL 24.2 01/02/2011 0946   LDLCALC 63 01/02/2011 0946    Physical Exam:    VS:  BP (!) 144/72   Pulse 79   Ht '6\' 1"'$  (1.854 m)   Wt 172 lb 9.6 oz (78.3 kg)   SpO2 98%   BMI 22.77 kg/m     Wt Readings from Last 3 Encounters:  07/18/22 172 lb 9.6 oz (78.3 kg)  08/01/21 177 lb 1.6  oz (80.3 kg)  07/30/20 181 lb 6.4 oz (82.3 kg)    GEN: Well nourished, well developed in no acute distress HEENT: Normal, moist mucous membranes NECK: No JVD CARDIAC: Irregularly irregular, normal S1 and S2, no rubs or gallops. 2-3/6 harsh SM and 2/6 holosystolic murmur VASCULAR: Radial and DP pulses 2+ bilaterally. No carotid bruits RESPIRATORY:  Clear to auscultation without rales, wheezing or rhonchi  ABDOMEN: Soft, non-tender, non-distended MUSCULOSKELETAL:  Ambulates independently SKIN: Warm and dry, no edema NEUROLOGIC:  Alert and oriented x 3. No focal neuro deficits noted. PSYCHIATRIC:  Normal affect    ASSESSMENT:    1. Nonrheumatic aortic valve stenosis   2. Nonrheumatic mitral valve regurgitation   3. Permanent atrial fibrillation ([place])   4. Long term (current) use of anticoagulants   5. Hypercoagulable state ([place])    PLAN:    Moderate-severe aortic stenosis (DVI 30, gradient 15, ?low-flow low-gradient) Mild-moderate mitral regurgitation -echo reviewed today. Repeat 1 year or sooner if symptoms develop, discussed today  Permanent atrial fibrillation: rate controlled on diltiazem, long term anticoagulation with coumadin -CHA2DS2/VAS Stroke Risk Points=4. We have  discussed DOAC but reasonable to continue coumadin per patient preference -labs ordered today to assist with coumadin recommendations prior to [facility]  Cardiac risk counseling and prevention recommendations: -recommend heart healthy/Mediterranean diet, with whole grains, fruits, vegetable, fish, lean meats, nuts, and olive oil. Limit salt. -recommend moderate walking, 3-5 times/week for 30-50 minutes each session. Aim for at least 150 minutes.week. Goal should be pace of 3 miles/hours, or walking 1.5 miles in 30 minutes -recommend avoidance of tobacco products. Avoid excess alcohol.  Plan for follow up: 1 year or sooner as needed, following repeat ultrasound.  Medication Adjustments/Labs and Tests Ordered: Current medicines are reviewed at length with the patient today.  Concerns regarding medicines are outlined above.   Orders Placed This Encounter  Procedures   CBC   Basic Metabolic Panel (BMET)   EKG 12-Lead   ECHOCARDIOGRAM COMPLETE   No orders of the defined types were placed in this encounter.  Patient Instructions  Medication Instructions:  The current medical regimen is effective;  continue present plan and medications.  *If you need a refill on your cardiac medications before your next appointment, please call your pharmacy*   Lab Work: CBC, BMET today   If you have labs (blood work) drawn today and your tests are completely normal, you will receive your results only by: [place] (if you have MyChart) OR A paper copy in the mail If you have any lab test that is abnormal or we need to change your treatment, we will call you to review the results.   Testing/Procedures: Echocardiogram (12 month)  - Your physician has requested that you have an echocardiogram. Echocardiography is a painless test that uses sound waves to create images of your heart. It provides your doctor with information about the size and shape of your heart and how well your heart's chambers and  valves are working. This procedure takes approximately one hour. There are no restrictions for this procedure.     Follow-Up: At [facility], you and your health needs are our priority.  As part of our continuing mission to provide you with exceptional heart care, we have created designated Provider Care Teams.  These Care Teams include your primary Cardiologist (physician) and Advanced Practice Providers (APPs -  Physician Assistants and Nurse Practitioners) who all work together to provide you with the care you need, when you need  it.  We recommend signing up for the patient portal called "MyChart".  Sign up information is provided on this After Visit Summary.  MyChart is used to connect with patients for Virtual Visits (Telemedicine).  Patients are able to view lab/test results, encounter notes, upcoming appointments, etc.  Non-urgent messages can be sent to your provider as well.   To learn more about what you can do with MyChart, go to [email or web address].    Your next appointment:   12 month(s)  The format for your next appointment:   In Person  Provider:   Buford Dresser, MD            Signed, Buford Dresser, MD PhD 07/19/2022   [place]

## 2022-07-19 ENCOUNTER — Encounter (HOSPITAL_BASED_OUTPATIENT_CLINIC_OR_DEPARTMENT_OTHER): Payer: Self-pay | Admitting: Cardiology

## 2022-07-19 NOTE — Telephone Encounter (Signed)
BMET and CBC checked yesterday, plt stable at 143K, SCr 0.79 with CrCl 29m/min. CHADS2VASc previously calculated as 4 without hx of stroke.  [facility] not require [facility].

## 2022-07-24 NOTE — Telephone Encounter (Signed)
   Name: Henry Holland  DOB: 02/20/1933  MRN: [id number]   Primary Cardiologist: Buford Dresser, MD  Chart reviewed as part of pre-operative protocol coverage.  Henry Holland was last seen on 07/18/22 by Dr. Harrell Gave.  Since that day, Henry Holland has done well. He has a history of low flow low gradient AS, mild to moderate MR, and permanent Afib on coumadin.   We have been asked for guidance to hold coumadin.   Per our pharmD: Recent labs checked. Pt ok to hold warfarin for 5 days prior to [facility]. He does not require bridging.   I will route this recommendation to the requesting party via Epic fax function and remove from pre-op pool. Please call with questions.  Tami Lin Shameka Aggarwal, PA 07/24/2022, 12:00 PM

## 2022-07-28 DIAGNOSIS — M5416 Radiculopathy, lumbar region: Secondary | ICD-10-CM | POA: Diagnosis not present

## 2022-08-03 DIAGNOSIS — Z7901 Long term (current) use of anticoagulants: Secondary | ICD-10-CM | POA: Diagnosis not present

## 2022-08-03 DIAGNOSIS — E1129 Type 2 diabetes mellitus with other diabetic kidney complication: Secondary | ICD-10-CM | POA: Diagnosis not present

## 2022-08-03 DIAGNOSIS — I480 Paroxysmal atrial fibrillation: Secondary | ICD-10-CM | POA: Diagnosis not present

## 2022-08-03 DIAGNOSIS — D6869 Other thrombophilia: Secondary | ICD-10-CM | POA: Diagnosis not present

## 2022-08-03 DIAGNOSIS — Z23 Encounter for immunization: Secondary | ICD-10-CM | POA: Diagnosis not present

## 2022-08-15 ENCOUNTER — Ambulatory Visit: Payer: Medicare HMO | Attending: Neurosurgery

## 2022-08-15 DIAGNOSIS — R293 Abnormal posture: Secondary | ICD-10-CM | POA: Insufficient documentation

## 2022-08-15 DIAGNOSIS — R262 Difficulty in walking, not elsewhere classified: Secondary | ICD-10-CM | POA: Insufficient documentation

## 2022-08-15 DIAGNOSIS — R252 Cramp and spasm: Secondary | ICD-10-CM | POA: Insufficient documentation

## 2022-08-15 DIAGNOSIS — M5459 Other low back pain: Secondary | ICD-10-CM | POA: Diagnosis not present

## 2022-08-15 DIAGNOSIS — M6281 Muscle weakness (generalized): Secondary | ICD-10-CM | POA: Insufficient documentation

## 2022-08-15 DIAGNOSIS — M79604 Pain in right leg: Secondary | ICD-10-CM | POA: Insufficient documentation

## 2022-08-15 NOTE — Therapy (Signed)
OUTPATIENT PHYSICAL THERAPY RECERT NOTE   Patient Name: Henry Holland MRN: [id number] DOB:10-07-1932, 88 y.o., male Today's Date: 08/15/2022   PT End of Session - 08/15/22 1243     Visit Number 5    Date for PT Re-Evaluation 10/10/22    Authorization Type AET[employer] MEDICARE    Progress Note Due on Visit 83    PT Start Time 1230    PT Stop Time 1315    PT Time Calculation (min) 45 min    Activity Tolerance Patient tolerated treatment well    Behavior During Therapy [facility] for tasks assessed/performed              Past Medical History:  Diagnosis Date   Arthritis    Chronic atrial fibrillation ([place])    Dysrhythmia    History of radiation therapy    Hyperlipidemia    Hypertension    Prostate cancer [facility])    Past Surgical History:  Procedure Laterality Date   HERNIA REPAIR     X2   Patient Active Problem List   Diagnosis Date Noted   Lumbar spondylosis 01/03/2021   Encounter for general adult medical examination without abnormal findings 10/14/2020   Hypercoagulable state ([place]) 10/14/2020   Long term (current) use of anticoagulants 10/14/2020   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   Lumbar radiculopathy 08/27/2018   Rupture of right rotator cuff 11/22/2015   Diabetic renal disease ([place]) 09/23/2012   Acquired hallux valgus 09/05/2012   Thrombocytopenia ([place]) 09/11/2011   Localized, primary osteoarthritis of hand 05/12/2011   Hypertension    Chronic atrial fibrillation ([facility])    Prostate cancer ([place])    Hyperlipidemia    History of malignant neoplasm of prostate 07/20/2010   Age-related osteoporosis without current pathological fracture 10/25/2009   Diabetic peripheral neuropathy associated with type 2 diabetes mellitus ([place]) 10/25/2009   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   Mitral valve disorder 08/28/1958    PCP: Ginger Organ., MD  REFERRING PROVIDER: Consuella Lose, MD   REFERRING DIAG: S22.080A T12 compression fracture  Rationale  for Evaluation and Treatment: Rehabilitation  THERAPY DIAG:  Other low back pain - Plan: PT plan of care cert/re-cert  Pain in right leg - Plan: PT plan of care cert/re-cert  Abnormal posture - Plan: PT plan of care cert/re-cert  Difficulty in walking, not elsewhere classified - Plan: PT plan of care cert/re-cert  Muscle weakness (generalized) - Plan: PT plan of care cert/re-cert  Cramp and spasm - Plan: PT plan of care cert/re-cert  ONSET DATE: 71/29/2909   SUBJECTIVE:  SUBJECTIVE STATEMENT: I had an ESI and it helped a little but I'm still having the right leg pain.  I keep going to the gym even though I sometimes don't feel like it but my energy level is so low because I'm not eating good.      PERTINENT HISTORY:  Hx of L1 compression fracture  PAIN:  Are you having pain? Yes: NPRS scale: 5/10 Pain location: low back and right leg Pain description: aching/sharp Aggravating factors: standing Relieving factors: lying down  PRECAUTIONS: Fall  WEIGHT BEARING RESTRICTIONS: No  FALLS:  Has patient fallen in last 6 months? Yes. Number of falls 1  LIVING ENVIRONMENT: Lives with: lives with their spouse Lives in: House/apartment Stairs: Yes: Internal: 12 steps; can reach both and External: 4 steps; can reach both Has following equipment at home: None  OCCUPATION: Retired  PLOF: Independent, Independent with basic ADLs, Independent with household mobility without device, Independent with community mobility without device, Independent with homemaking with ambulation, Independent with gait, and Independent with transfers  PATIENT GOALS: to be able to do my usual activities without pain   OBJECTIVE:   DIAGNOSTIC FINDINGS:  IMPRESSION: 1. Age indeterminate but possibly subacute wedge  compression fracture of T12 with approximately 50% height loss and moderate bone marrow edema. No retropulsion. 2. Severe spinal canal stenosis and bilateral neural foraminal stenosis at L4-L5 due to combination of disc bulge, synovial cyst and facet arthrosis. 3. Moderate spinal canal stenosis and moderate right neural foraminal stenosis at L3-L4. 4. Severe right and moderate left L2-L3 neural foraminal stenosis  PATIENT SURVEYS:  06/29/22 FOTO: 38 (goal is 41) 08/15/22 FOTO: 35  SCREENING FOR RED FLAGS: Bowel or bladder incontinence: No Spinal tumors: No Cauda equina syndrome: No Compression fracture: No Abdominal aneurysm: No  COGNITION: Overall cognitive status: Within functional limits for tasks assessed     SENSATION: WFL  MUSCLE LENGTH: Hamstrings: Right 50 deg; Left 50 deg Hamstrings: 08/15/22 improved to approx 60 degrees bilaterally Thomas test: Right pos ; Left pos   POSTURE: rounded shoulders, forward head, decreased lumbar lordosis, increased thoracic kyphosis, and posterior pelvic tilt   LUMBAR ROM:   AROM eval 08/15/22  Flexion WNL WNL  Extension 25% 25%  Right lateral flexion Fingertips to mid thigh Fingertips to joint line  Left lateral flexion Fingertips to mid thigh Fingertips to joint line  Right rotation Immediate pain - limited to 25% 75% without pain  Left rotation 50% 75%    (Blank rows = not tested)  LOWER EXTREMITY ROM:     WNL  LOWER EXTREMITY MMT:    Initial eval: Generally 4+/5 with exception of right hip flexion 3/5, right knee extension 3+/5, right hip ext 3/5, right hip abd 3/5 08/15/22: Right hip flexion 4/5, right knee extension 4/5, right hip ext 4-/5, right hip abd 3+/5  LUMBAR SPECIAL TESTS:  Straight leg raise test: Negative  FUNCTIONAL TESTS:  Initial eval: 5 times sit to stand: 24.07 sec Timed up and go (TUG): 13.00  08/15/22: 5 times sit to stand: 17.25 sec Timed up and go (TUG): 8.03 sec  GAIT: Distance  walked: 30 Assistive device utilized: None Level of assistance: Complete Independence Comments: antalgic  TODAY'S TREATMENT:           Date: 08/15/22 Reassessment for recertification Standing hamstring stretch 2 x 30 sec each LE Standing hip flexor stretch 2 x 30 sec each LE Hook lying PPT x 20 PPT with 90/90 heel tap x 20 PPT with dying  bug x 20  Date: 07/17/22 NuStep: Level 2x 5 minutes   Trigger Point Dry-Needling  Treatment instructions: Expect mild to moderate muscle soreness. S/S of pneumothorax if dry needled over a lung field, and to seek immediate medical attention should they occur. Patient verbalized understanding of these instructions and education.  Patient Consent Given: Yes Education handout provided: Yes Muscles treated: lumbar multifidi Electrical stimulation performed: No Parameters: N/A Treatment response/outcome: Palpated lumbar spine area thoroughly for trigger points and tight bands, once located, administered dry needling to these areas.  Patient report of decreased pain and mild twitch response  Supine hamstring stretch 5 x 10 sec each Supine IT band stretch 5 x 10 sec each Hook lying trunk rotation x 10 each side Butterfly stretch x 5 holding 10 sec each Open book stretch x 5 hold 10 sec each side PPT x 20 PPT with 90/90 heel taps x 20 PPT with dying bug x 20 Hook lying hip IR's stretch in figure 4 seated Ice to low back x 10 min in hook lying   Date: 06/29/22 NuStep: Level 2x 8 minutes   Standing hamstring stretch 2 x 30 seconds both Standing quad stretch on chair 2 x 30 seconds both Seated piriformis stretch 2 x 30 sec both Hook lying trunk rotation x 5 each side hold 10 sec each Combo hip IR/ER stretch x 5 each side hold 10 sec each Butterfly stretch x 3 hold 10 sec each  PPT x 20 PPT with 90/90 heel tap x 20 PPT with alternating arms and legs x 20  Date: 06/29/22 NuStep: Level 2x 8 minutes   Seated hamstring stretch 3x20 seconds  Standing  quad stretch on chair 3x20 seconds                                                                                                                        DATE: 06/23/2022  Evaluation completed and initiated HEP   PATIENT EDUCATION:  Education details: Initiated HEP, educated on anatomy of the spine and the nature of his condition Person educated: Patient Education method: Consulting civil engineer, Media planner, Verbal cues, and Handouts Education comprehension: verbalized understanding, returned demonstration, verbal cues required, and needs further education  HOME EXERCISE PROGRAM: Access Code: [email or web address] URL: [email or web address] Date: 06/23/2022 Prepared by: Candyce Churn  Exercises - Standing Hamstring Stretch on Chair  - 1 x daily - 7 x weekly - 1 sets - 3 reps - 30 hold - Quadricep Stretch with Chair and Counter Support  - 1 x daily - 7 x weekly - 1 sets - 3 reps - 30 hold - Supine Piriformis Stretch with Foot on Ground  - 1 x daily - 7 x weekly - 1 sets - 3 reps - 30 hold - Supine Figure 4 Piriformis Stretch  - 1 x daily - 7 x weekly - 1 sets - 3 reps - 30 hold - Lower Trunk Rotations  - 1 x daily - 7 x weekly - 1 sets - 10  reps  ASSESSMENT:  CLINICAL IMPRESSION: Mr. Okane had his ESI and seems to have had some minor relief.  His objective findings are much improved.  He tends to skip over exercises that are harder and does the easier ones. He admitted he had not been doing his hamstring and quad/hip flexor stretches. We will go ahead and recertify as he has only had 5 visits.  He would benefit from being more consistent with attending as he tends to self direct at times.     Patient will benefit from skilled PT to address the below impairments and improve overall function.   OBJECTIVE IMPAIRMENTS: decreased activity tolerance, decreased knowledge of condition, decreased mobility, difficulty walking, decreased ROM, decreased strength, decreased safety awareness, increased  fascial restrictions, increased muscle spasms, impaired flexibility, and pain.   ACTIVITY LIMITATIONS: carrying, lifting, bending, sitting, standing, squatting, sleeping, stairs, transfers, bed mobility, and dressing  PARTICIPATION LIMITATIONS: meal prep, cleaning, laundry, shopping, community activity, and yard work  PERSONAL FACTORS: Age, Fitness, and Past/current experiences are also affecting patient's functional outcome.   REHAB POTENTIAL: Good  CLINICAL DECISION MAKING: Stable/uncomplicated  EVALUATION COMPLEXITY: Low   GOALS: Goals reviewed with patient? Yes  SHORT TERM GOALS: Target date: 07/21/2022  Patient will be independent with initial HEP  Baseline: Goal status: MET  2.  Pain report to be no greater than 4/10  Baseline: 7/10 Goal status: IN PROGRESS   LONG TERM GOALS: Target date: 10/10/2021  Patient to be independent with advanced HEP  Baseline:  Goal status: IN PROGRESS  2.  Patient to report pain no greater than 2/10  Baseline: 7/10 Goal status: IN PROGRESS  3.  Functional tests to improve by 5 seconds (5 times sit to stand), and 3 seconds TUG Baseline:  Goal status: MET 08/15/22  4.  Patient to be able to walk 1 mile without needing to stop and rest due to back pain Baseline:  Goal status: IN PROGRESS  5.  Radicular symptoms in leg to be centralized Baseline:  Goal status: IN PROGRESS (more intermittent than constant now 08-15-22)  6.  Patient to be able to resume his outdoor chores including mowing his lawn Baseline:  Goal status: IN PROGRESS  PLAN:  PT FREQUENCY: Recertifying for 4-8A/XKPV  PT DURATION: for an additional 8 weeks as of 08/15/22  PLANNED INTERVENTIONS: Therapeutic exercises, Therapeutic activity, Neuromuscular re-education, Balance training, Gait training, Patient/Family education, Self Care, Joint mobilization, Stair training, DME instructions, Aquatic Therapy, Dry Needling, Electrical stimulation, Cryotherapy, Moist  heat, Taping, Ultrasound, Ionotophoresis 63m/ml Dexamethasone, Manual therapy, and Re-evaluation.  PLAN FOR NEXT SESSION: Recertifying for 1-2 times per week x 8 weeks.  Nu Step, focused core strengthening, flexibility, manual to address pain.  DN to low back if indicated and patient agrees.    JAnderson MaltaB. Ereka Brau, PT 08/15/22 2:26 PM   [address] SHighland HeightsGLoch Sheldrake [place] [address]Phone # [phone] [phone]

## 2022-08-17 DIAGNOSIS — I480 Paroxysmal atrial fibrillation: Secondary | ICD-10-CM | POA: Diagnosis not present

## 2022-08-17 DIAGNOSIS — E1129 Type 2 diabetes mellitus with other diabetic kidney complication: Secondary | ICD-10-CM | POA: Diagnosis not present

## 2022-08-17 DIAGNOSIS — Z7901 Long term (current) use of anticoagulants: Secondary | ICD-10-CM | POA: Diagnosis not present

## 2022-08-17 DIAGNOSIS — D6869 Other thrombophilia: Secondary | ICD-10-CM | POA: Diagnosis not present

## 2022-08-22 ENCOUNTER — Ambulatory Visit: Payer: Medicare HMO

## 2022-08-22 DIAGNOSIS — R262 Difficulty in walking, not elsewhere classified: Secondary | ICD-10-CM

## 2022-08-22 DIAGNOSIS — R252 Cramp and spasm: Secondary | ICD-10-CM | POA: Diagnosis not present

## 2022-08-22 DIAGNOSIS — M79604 Pain in right leg: Secondary | ICD-10-CM

## 2022-08-22 DIAGNOSIS — M5459 Other low back pain: Secondary | ICD-10-CM | POA: Diagnosis not present

## 2022-08-22 DIAGNOSIS — R293 Abnormal posture: Secondary | ICD-10-CM

## 2022-08-22 DIAGNOSIS — M6281 Muscle weakness (generalized): Secondary | ICD-10-CM | POA: Diagnosis not present

## 2022-08-22 NOTE — Therapy (Signed)
OUTPATIENT PHYSICAL THERAPY RECERT NOTE   Patient Name: Henry Holland MRN: [id number] DOB:23-Feb-1933, 88 y.o., male Today's Date: 08/22/2022   PT End of Session - 08/22/22 1102     Visit Number 6    Date for PT Re-Evaluation 10/10/22    Authorization Type AETNA MEDICARE    PT Start Time 68    PT Stop Time 1145    PT Time Calculation (min) 45 min    Activity Tolerance Patient tolerated treatment well    Behavior During Therapy [facility] for tasks assessed/performed              Past Medical History:  Diagnosis Date   Arthritis    Chronic atrial fibrillation ([place])    Dysrhythmia    History of radiation therapy    Hyperlipidemia    Hypertension    Prostate cancer [facility])    Past Surgical History:  Procedure Laterality Date   HERNIA REPAIR     X2   Patient Active Problem List   Diagnosis Date Noted   Lumbar spondylosis 01/03/2021   Encounter for general adult medical examination without abnormal findings 10/14/2020   Hypercoagulable state ([place]) 10/14/2020   Long term (current) use of anticoagulants 10/14/2020   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   Lumbar radiculopathy 08/27/2018   Rupture of right rotator cuff 11/22/2015   Diabetic renal disease ([place]) 09/23/2012   Acquired hallux valgus 09/05/2012   Thrombocytopenia ([place]) 09/11/2011   Localized, primary osteoarthritis of hand 05/12/2011   Hypertension    Chronic atrial fibrillation ([facility])    Prostate cancer ([place])    Hyperlipidemia    History of malignant neoplasm of prostate 07/20/2010   Age-related osteoporosis without current pathological fracture 10/25/2009   Diabetic peripheral neuropathy associated with type 2 diabetes mellitus ([place]) 10/25/2009   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   Mitral valve disorder 08/28/1958    PCP: Ginger Organ., MD  REFERRING PROVIDER: Consuella Lose, MD   REFERRING DIAG: S22.080A T12 compression fracture  Rationale for Evaluation and Treatment:  Rehabilitation  THERAPY DIAG:  Other low back pain  Pain in right leg  Abnormal posture  Difficulty in walking, not elsewhere classified  Muscle weakness (generalized)  Cramp and spasm  ONSET DATE: 06/22/2022   SUBJECTIVE:                                                                                                                                                                                           SUBJECTIVE STATEMENT: Patient states he had the best day he's had in a while on Saturday but the next two days, his pain returned.  But it's overall better.      PERTINENT HISTORY:  Hx of L1 compression fracture  PAIN:  Are you having pain? Yes: NPRS scale: 5/10 Pain location: low back and right leg Pain description: aching/sharp Aggravating factors: standing Relieving factors: lying down  PRECAUTIONS: Fall  WEIGHT BEARING RESTRICTIONS: No  FALLS:  Has patient fallen in last 6 months? Yes. Number of falls 1  LIVING ENVIRONMENT: Lives with: lives with their spouse Lives in: House/apartment Stairs: Yes: Internal: 12 steps; can reach both and External: 4 steps; can reach both Has following equipment at home: None  OCCUPATION: Retired  PLOF: Independent, Independent with basic ADLs, Independent with household mobility without device, Independent with community mobility without device, Independent with homemaking with ambulation, Independent with gait, and Independent with transfers  PATIENT GOALS: to be able to do my usual activities without pain   OBJECTIVE:   DIAGNOSTIC FINDINGS:  IMPRESSION: 1. Age indeterminate but possibly subacute wedge compression fracture of T12 with approximately 50% height loss and moderate bone marrow edema. No retropulsion. 2. Severe spinal canal stenosis and bilateral neural foraminal stenosis at L4-L5 due to combination of disc bulge, synovial cyst and facet arthrosis. 3. Moderate spinal canal stenosis and moderate right  neural foraminal stenosis at L3-L4. 4. Severe right and moderate left L2-L3 neural foraminal stenosis  PATIENT SURVEYS:  06/29/22 FOTO: 31 (goal is 61) 08/15/22 FOTO: 49  SCREENING FOR RED FLAGS: Bowel or bladder incontinence: No Spinal tumors: No Cauda equina syndrome: No Compression fracture: No Abdominal aneurysm: No  COGNITION: Overall cognitive status: Within functional limits for tasks assessed     SENSATION: WFL  MUSCLE LENGTH: Hamstrings: Right 50 deg; Left 50 deg Hamstrings: 08/15/22 improved to approx 60 degrees bilaterally Thomas test: Right pos ; Left pos   POSTURE: rounded shoulders, forward head, decreased lumbar lordosis, increased thoracic kyphosis, and posterior pelvic tilt   LUMBAR ROM:   AROM eval 08/15/22  Flexion WNL WNL  Extension 25% 25%  Right lateral flexion Fingertips to mid thigh Fingertips to joint line  Left lateral flexion Fingertips to mid thigh Fingertips to joint line  Right rotation Immediate pain - limited to 25% 75% without pain  Left rotation 50% 75%    (Blank rows = not tested)  LOWER EXTREMITY ROM:     WNL  LOWER EXTREMITY MMT:    Initial eval: Generally 4+/5 with exception of right hip flexion 3/5, right knee extension 3+/5, right hip ext 3/5, right hip abd 3/5 08/15/22: Right hip flexion 4/5, right knee extension 4/5, right hip ext 4-/5, right hip abd 3+/5  LUMBAR SPECIAL TESTS:  Straight leg raise test: Negative  FUNCTIONAL TESTS:  Initial eval: 5 times sit to stand: 24.07 sec Timed up and go (TUG): 13.00  08/15/22: 5 times sit to stand: 17.25 sec Timed up and go (TUG): 8.03 sec  GAIT: Distance walked: 30 Assistive device utilized: None Level of assistance: Complete Independence Comments: antalgic  TODAY'S TREATMENT:           Date: 08/22/22 NuStep x 7 min level 3 (PT present to discuss status) Standing hamstring stretch 2 x 30 sec each LE Standing hip flexor stretch 2 x 30 sec each LE Hook lying lower  trunk rotation x 10 holding 10 sec each side  Hook lying PPT x 20 PPT with 90/90 heel tap x 20 PPT with dying bug x 20 PPT with SLR to shoulder extension with yellow plyo ball x 20 each LE Hook lying piriformis and  hip IR's stretch 2 x 30 sec each side Supine IT band stretch 2 x 30 sec  Pt declined Ice "I'm going to go walk on the treadmill at [employer] for a little bit"  Date: 08/15/22 Reassessment for recertification Standing hamstring stretch 2 x 30 sec each LE Standing hip flexor stretch 2 x 30 sec each LE Hook lying PPT x 20 PPT with 90/90 heel tap x 20 PPT with dying bug x 20  Date: 07/17/22 NuStep: Level 2x 5 minutes   Trigger Point Dry-Needling  Treatment instructions: Expect mild to moderate muscle soreness. S/S of pneumothorax if dry needled over a lung field, and to seek immediate medical attention should they occur. Patient verbalized understanding of these instructions and education.  Patient Consent Given: Yes Education handout provided: Yes Muscles treated: lumbar multifidi Electrical stimulation performed: No Parameters: N/A Treatment response/outcome: Palpated lumbar spine area thoroughly for trigger points and tight bands, once located, administered dry needling to these areas.  Patient report of decreased pain and mild twitch response  Supine hamstring stretch 5 x 10 sec each Supine IT band stretch 5 x 10 sec each Hook lying trunk rotation x 10 each side Butterfly stretch x 5 holding 10 sec each Open book stretch x 5 hold 10 sec each side PPT x 20 PPT with 90/90 heel taps x 20 PPT with dying bug x 20 Hook lying hip IR's stretch in figure 4 seated Ice to low back x 10 min in hook lying   PATIENT EDUCATION:  Education details: Initiated HEP, educated on anatomy of the spine and the nature of his condition Person educated: Patient Education method: Consulting civil engineer, Media planner, Verbal cues, and Handouts Education comprehension: verbalized understanding,  returned demonstration, verbal cues required, and needs further education  HOME EXERCISE PROGRAM: Access Code: [email or web address] URL: [email or web address] Date: 06/23/2022 Prepared by: Candyce Churn  Exercises - Standing Hamstring Stretch on Chair  - 1 x daily - 7 x weekly - 1 sets - 3 reps - 30 hold - Quadricep Stretch with Chair and Counter Support  - 1 x daily - 7 x weekly - 1 sets - 3 reps - 30 hold - Supine Piriformis Stretch with Foot on Ground  - 1 x daily - 7 x weekly - 1 sets - 3 reps - 30 hold - Supine Figure 4 Piriformis Stretch  - 1 x daily - 7 x weekly - 1 sets - 3 reps - 30 hold - Lower Trunk Rotations  - 1 x daily - 7 x weekly - 1 sets - 10 reps  ASSESSMENT:  CLINICAL IMPRESSION: Mr. Beaubrun had a good day on Saturday and he seems to be seeing the benefit of the therapy in that his pain is not as severe and he is able to do more functionally.  He was able to do all tasks today with minimal pain.  He should continue to improve.    Patient will benefit from skilled PT to address the below impairments and improve overall function.   OBJECTIVE IMPAIRMENTS: decreased activity tolerance, decreased knowledge of condition, decreased mobility, difficulty walking, decreased ROM, decreased strength, decreased safety awareness, increased fascial restrictions, increased muscle spasms, impaired flexibility, and pain.   ACTIVITY LIMITATIONS: carrying, lifting, bending, sitting, standing, squatting, sleeping, stairs, transfers, bed mobility, and dressing  PARTICIPATION LIMITATIONS: meal prep, cleaning, laundry, shopping, community activity, and yard work  PERSONAL FACTORS: Age, Fitness, and Past/current experiences are also affecting patient's functional outcome.   REHAB POTENTIAL: Good  CLINICAL DECISION MAKING:  Stable/uncomplicated  EVALUATION COMPLEXITY: Low   GOALS: Goals reviewed with patient? Yes  SHORT TERM GOALS: Target date: 07/21/2022  Patient will be independent  with initial HEP  Baseline: Goal status: MET  2.  Pain report to be no greater than 4/10  Baseline: 7/10 Goal status: IN PROGRESS   LONG TERM GOALS: Target date: 10/10/2021  Patient to be independent with advanced HEP  Baseline:  Goal status: IN PROGRESS  2.  Patient to report pain no greater than 2/10  Baseline: 7/10 Goal status: IN PROGRESS  3.  Functional tests to improve by 5 seconds (5 times sit to stand), and 3 seconds TUG Baseline:  Goal status: MET 08/15/22  4.  Patient to be able to walk 1 mile without needing to stop and rest due to back pain Baseline:  Goal status: IN PROGRESS  5.  Radicular symptoms in leg to be centralized Baseline:  Goal status: IN PROGRESS (more intermittent than constant now 08-15-22)  6.  Patient to be able to resume his outdoor chores including mowing his lawn Baseline:  Goal status: IN PROGRESS  PLAN:  PT FREQUENCY: Recertifying for 5-5E/ZVGJ  PT DURATION: for an additional 8 weeks as of 08/15/22  PLANNED INTERVENTIONS: Therapeutic exercises, Therapeutic activity, Neuromuscular re-education, Balance training, Gait training, Patient/Family education, Self Care, Joint mobilization, Stair training, DME instructions, Aquatic Therapy, Dry Needling, Electrical stimulation, Cryotherapy, Moist heat, Taping, Ultrasound, Ionotophoresis 39m/ml Dexamethasone, Manual therapy, and Re-evaluation.  PLAN FOR NEXT SESSION:   Nu Step, focused core strengthening, flexibility, manual to address pain.  DN to low back when indicated and patient agrees.    JAnderson MaltaB. Ceceilia Cephus, PT 08/22/22 11:49 AM   [place] SHarlanGSimsbury Center [place] [address]Phone # [phone] [phone]

## 2022-09-05 ENCOUNTER — Ambulatory Visit: Payer: Medicare HMO | Attending: Neurosurgery

## 2022-09-05 DIAGNOSIS — R293 Abnormal posture: Secondary | ICD-10-CM | POA: Diagnosis not present

## 2022-09-05 DIAGNOSIS — R262 Difficulty in walking, not elsewhere classified: Secondary | ICD-10-CM

## 2022-09-05 DIAGNOSIS — M5459 Other low back pain: Secondary | ICD-10-CM | POA: Diagnosis not present

## 2022-09-05 DIAGNOSIS — M6281 Muscle weakness (generalized): Secondary | ICD-10-CM | POA: Diagnosis not present

## 2022-09-05 DIAGNOSIS — M79604 Pain in right leg: Secondary | ICD-10-CM | POA: Diagnosis not present

## 2022-09-05 DIAGNOSIS — R252 Cramp and spasm: Secondary | ICD-10-CM | POA: Diagnosis not present

## 2022-09-05 NOTE — Therapy (Signed)
OUTPATIENT PHYSICAL THERAPY RECERT NOTE   Patient Name: Henry Holland MRN: [id number] DOB:06-Apr-1933, 89 y.o., male Today's Date: 09/05/2022   PT End of Session - 09/05/22 1058     Visit Number 7    Date for PT Re-Evaluation 10/10/22    Authorization Type AET[employer] MEDICARE    Progress Note Due on Visit 37    PT Start Time 1059    PT Stop Time 1147    PT Time Calculation (min) 48 min    Activity Tolerance Patient tolerated treatment well    Behavior During Therapy [facility] for tasks assessed/performed              Past Medical History:  Diagnosis Date   Arthritis    Chronic atrial fibrillation ([place])    Dysrhythmia    History of radiation therapy    Hyperlipidemia    Hypertension    Prostate cancer [facility]    Past Surgical History:  Procedure Laterality Date   HERNIA REPAIR     X2   Patient Active Problem List   Diagnosis Date Noted   Lumbar spondylosis 01/03/2021   Encounter for general adult medical examination without abnormal findings 10/14/2020   Hypercoagulable state ([place]) 10/14/2020   Long term (current) use of anticoagulants 10/14/2020   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   Lumbar radiculopathy 08/27/2018   Rupture of right rotator cuff 11/22/2015   Diabetic renal disease ([place]) 09/23/2012   Acquired hallux valgus 09/05/2012   Thrombocytopenia ([place]) 09/11/2011   Localized, primary osteoarthritis of hand 05/12/2011   Hypertension    Chronic atrial fibrillation (HCC)    Prostate cancer ([place])    Hyperlipidemia    History of malignant neoplasm of prostate 07/20/2010   Age-related osteoporosis without current pathological fracture 10/25/2009   Diabetic peripheral neuropathy associated with type 2 diabetes mellitus ([place]) 10/25/2009   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   Mitral valve disorder 08/28/1958    PCP: Ginger Organ., MD  REFERRING PROVIDER: Consuella Lose, MD   REFERRING DIAG: S22.080A T12 compression fracture  Rationale  for Evaluation and Treatment: Rehabilitation  THERAPY DIAG:  Other low back pain  Pain in right leg  Difficulty in walking, not elsewhere classified  Muscle weakness (generalized)  Cramp and spasm  Abnormal posture  ONSET DATE: 06/22/2022   SUBJECTIVE:                                                                                                                                                                                           SUBJECTIVE STATEMENT: Patient states he has good days and bad days and does feel like  his bad days are less often.  He admits he is still not as compliant as he should be and would probably do better if he did his exercises more often.       PERTINENT HISTORY:  Hx of L1 compression fracture  PAIN:  Are you having pain? Yes: NPRS scale: 5/10 Pain location: low back and right leg Pain description: aching/sharp Aggravating factors: standing Relieving factors: lying down  PRECAUTIONS: Fall  WEIGHT BEARING RESTRICTIONS: No  FALLS:  Has patient fallen in last 6 months? Yes. Number of falls 1  LIVING ENVIRONMENT: Lives with: lives with their spouse Lives in: House/apartment Stairs: Yes: Internal: 12 steps; can reach both and External: 4 steps; can reach both Has following equipment at home: None  OCCUPATION: Retired  PLOF: Independent, Independent with basic ADLs, Independent with household mobility without device, Independent with community mobility without device, Independent with homemaking with ambulation, Independent with gait, and Independent with transfers  PATIENT GOALS: to be able to do my usual activities without pain   OBJECTIVE:   DIAGNOSTIC FINDINGS:  IMPRESSION: 1. Age indeterminate but possibly subacute wedge compression fracture of T12 with approximately 50% height loss and moderate bone marrow edema. No retropulsion. 2. Severe spinal canal stenosis and bilateral neural foraminal stenosis at L4-L5 due to combination  of disc bulge, synovial cyst and facet arthrosis. 3. Moderate spinal canal stenosis and moderate right neural foraminal stenosis at L3-L4. 4. Severe right and moderate left L2-L3 neural foraminal stenosis  PATIENT SURVEYS:  06/29/22 FOTO: 34 (goal is 56) 08/15/22 FOTO: 103  SCREENING FOR RED FLAGS: Bowel or bladder incontinence: No Spinal tumors: No Cauda equina syndrome: No Compression fracture: No Abdominal aneurysm: No  COGNITION: Overall cognitive status: Within functional limits for tasks assessed     SENSATION: WFL  MUSCLE LENGTH: Hamstrings: Right 50 deg; Left 50 deg Hamstrings: 08/15/22 improved to approx 60 degrees bilaterally Thomas test: Right pos ; Left pos   POSTURE: rounded shoulders, forward head, decreased lumbar lordosis, increased thoracic kyphosis, and posterior pelvic tilt   LUMBAR ROM:   AROM eval 08/15/22  Flexion WNL WNL  Extension 25% 25%  Right lateral flexion Fingertips to mid thigh Fingertips to joint line  Left lateral flexion Fingertips to mid thigh Fingertips to joint line  Right rotation Immediate pain - limited to 25% 75% without pain  Left rotation 50% 75%    (Blank rows = not tested)  LOWER EXTREMITY ROM:     WNL  LOWER EXTREMITY MMT:    Initial eval: Generally 4+/5 with exception of right hip flexion 3/5, right knee extension 3+/5, right hip ext 3/5, right hip abd 3/5 08/15/22: Right hip flexion 4/5, right knee extension 4/5, right hip ext 4-/5, right hip abd 3+/5  LUMBAR SPECIAL TESTS:  Straight leg raise test: Negative  FUNCTIONAL TESTS:  Initial eval: 5 times sit to stand: 24.07 sec Timed up and go (TUG): 13.00  08/15/22: 5 times sit to stand: 17.25 sec Timed up and go (TUG): 8.03 sec  GAIT: Distance walked: 30 Assistive device utilized: None Level of assistance: Complete Independence Comments: antalgic  TODAY'S TREATMENT:           Date: 09/05/21 NuStep x 9 min level 3 (PT present to discuss status) Standing  hamstring stretch 3 x 30 sec each LE Standing hip flexor stretch 3 x 30 sec each LE Hook lying lower trunk rotation x 10 holding 10 sec each side  Hook lying PPT x 20 PPT with 90/90  heel tap x 20 PPT with dying bug x 20 PPT with SLR to shoulder extension with yellow plyo ball x 20 each LE Hook lying piriformis and hip IR's stretch 2 x 30 sec each side Pt declined Ice "I'm going to go walk on the treadmill at [employer] for a little bit"  Date: 08/22/22 NuStep x 7 min level 3 (PT present to discuss status) Standing hamstring stretch 2 x 30 sec each LE Standing hip flexor stretch 2 x 30 sec each LE Hook lying lower trunk rotation x 10 holding 10 sec each side  Hook lying PPT x 20 PPT with 90/90 heel tap x 20 PPT with dying bug x 20 PPT with SLR to shoulder extension with yellow plyo ball x 20 each LE Hook lying piriformis and hip IR's stretch 2 x 30 sec each side Supine IT band stretch 2 x 30 sec  Pt declined Ice "I'm going to go walk on the treadmill at [employer] for a little bit"  Date: 08/15/22 Reassessment for recertification Standing hamstring stretch 2 x 30 sec each LE Standing hip flexor stretch 2 x 30 sec each LE Hook lying PPT x 20 PPT with 90/90 heel tap x 20 PPT with dying bug x 20    PATIENT EDUCATION:  Education details: Initiated HEP, educated on anatomy of the spine and the nature of his condition Person educated: Patient Education method: Consulting civil engineer, Media planner, Verbal cues, and Handouts Education comprehension: verbalized understanding, returned demonstration, verbal cues required, and needs further education  HOME EXERCISE PROGRAM: Access Code: [email or web address] URL: [email or web address] Date: 06/23/2022 Prepared by: Candyce Churn  Exercises - Standing Hamstring Stretch on Chair  - 1 x daily - 7 x weekly - 1 sets - 3 reps - 30 hold - Quadricep Stretch with Chair and Counter Support  - 1 x daily - 7 x weekly - 1 sets - 3 reps - 30  hold - Supine Piriformis Stretch with Foot on Ground  - 1 x daily - 7 x weekly - 1 sets - 3 reps - 30 hold - Supine Figure 4 Piriformis Stretch  - 1 x daily - 7 x weekly - 1 sets - 3 reps - 30 hold - Lower Trunk Rotations  - 1 x daily - 7 x weekly - 1 sets - 10 reps  ASSESSMENT:  CLINICAL IMPRESSION: Mr. Alcock is progressing appropriately.  He is not as compliant with his HEP as desired but he is more compliant each week as he sees the benefit from it.  He had some tingling with standing hip flexor stretching   Patient will benefit from skilled PT to address the below impairments and improve overall function.   OBJECTIVE IMPAIRMENTS: decreased activity tolerance, decreased knowledge of condition, decreased mobility, difficulty walking, decreased ROM, decreased strength, decreased safety awareness, increased fascial restrictions, increased muscle spasms, impaired flexibility, and pain.   ACTIVITY LIMITATIONS: carrying, lifting, bending, sitting, standing, squatting, sleeping, stairs, transfers, bed mobility, and dressing  PARTICIPATION LIMITATIONS: meal prep, cleaning, laundry, shopping, community activity, and yard work  PERSONAL FACTORS: Age, Fitness, and Past/current experiences are also affecting patient's functional outcome.   REHAB POTENTIAL: Good  CLINICAL DECISION MAKING: Stable/uncomplicated  EVALUATION COMPLEXITY: Low   GOALS: Goals reviewed with patient? Yes  SHORT TERM GOALS: Target date: 07/21/2022  Patient will be independent with initial HEP  Baseline: Goal status: MET  2.  Pain report to be no greater than 4/10  Baseline: 7/10 Goal status: IN PROGRESS   LONG  TERM GOALS: Target date: 10/10/2021  Patient to be independent with advanced HEP  Baseline:  Goal status: IN PROGRESS  2.  Patient to report pain no greater than 2/10  Baseline: 7/10 Goal status: IN PROGRESS  3.  Functional tests to improve by 5 seconds (5 times sit to stand), and 3 seconds  TUG Baseline:  Goal status: MET 08/15/22  4.  Patient to be able to walk 1 mile without needing to stop and rest due to back pain Baseline:  Goal status: IN PROGRESS  5.  Radicular symptoms in leg to be centralized Baseline:  Goal status: IN PROGRESS (more intermittent than constant now 08-15-22)  6.  Patient to be able to resume his outdoor chores including mowing his lawn Baseline:  Goal status: IN PROGRESS  PLAN:  PT FREQUENCY: Recertifying for 8-2N/KNLZ  PT DURATION: for an additional 8 weeks as of 08/15/22  PLANNED INTERVENTIONS: Therapeutic exercises, Therapeutic activity, Neuromuscular re-education, Balance training, Gait training, Patient/Family education, Self Care, Joint mobilization, Stair training, DME instructions, Aquatic Therapy, Dry Needling, Electrical stimulation, Cryotherapy, Moist heat, Taping, Ultrasound, Ionotophoresis '4mg'$ /ml Dexamethasone, Manual therapy, and Re-evaluation.  PLAN FOR NEXT SESSION:   Nu Step, focused core strengthening, flexibility, manual to address pain.  DN to low back when indicated and patient agrees.    Anderson Malta B. Calton Harshfield, PT 09/05/22 11:49 AM   [facility] [address], [place], [place] [address] Phone # [phone] Fax [phone]

## 2022-09-12 ENCOUNTER — Ambulatory Visit: Payer: Medicare HMO

## 2022-09-12 DIAGNOSIS — R262 Difficulty in walking, not elsewhere classified: Secondary | ICD-10-CM

## 2022-09-12 DIAGNOSIS — M5441 Lumbago with sciatica, right side: Secondary | ICD-10-CM | POA: Diagnosis not present

## 2022-09-12 DIAGNOSIS — M810 Age-related osteoporosis without current pathological fracture: Secondary | ICD-10-CM | POA: Diagnosis not present

## 2022-09-12 DIAGNOSIS — M79604 Pain in right leg: Secondary | ICD-10-CM | POA: Diagnosis not present

## 2022-09-12 DIAGNOSIS — E785 Hyperlipidemia, unspecified: Secondary | ICD-10-CM | POA: Diagnosis not present

## 2022-09-12 DIAGNOSIS — Z7901 Long term (current) use of anticoagulants: Secondary | ICD-10-CM | POA: Diagnosis not present

## 2022-09-12 DIAGNOSIS — D6869 Other thrombophilia: Secondary | ICD-10-CM | POA: Diagnosis not present

## 2022-09-12 DIAGNOSIS — R252 Cramp and spasm: Secondary | ICD-10-CM

## 2022-09-12 DIAGNOSIS — M6281 Muscle weakness (generalized): Secondary | ICD-10-CM

## 2022-09-12 DIAGNOSIS — I480 Paroxysmal atrial fibrillation: Secondary | ICD-10-CM | POA: Diagnosis not present

## 2022-09-12 DIAGNOSIS — K219 Gastro-esophageal reflux disease without esophagitis: Secondary | ICD-10-CM | POA: Diagnosis not present

## 2022-09-12 DIAGNOSIS — E1129 Type 2 diabetes mellitus with other diabetic kidney complication: Secondary | ICD-10-CM | POA: Diagnosis not present

## 2022-09-12 DIAGNOSIS — R293 Abnormal posture: Secondary | ICD-10-CM | POA: Diagnosis not present

## 2022-09-12 DIAGNOSIS — M5459 Other low back pain: Secondary | ICD-10-CM | POA: Diagnosis not present

## 2022-09-12 DIAGNOSIS — E1140 Type 2 diabetes mellitus with diabetic neuropathy, unspecified: Secondary | ICD-10-CM | POA: Diagnosis not present

## 2022-09-12 DIAGNOSIS — D696 Thrombocytopenia, unspecified: Secondary | ICD-10-CM | POA: Diagnosis not present

## 2022-09-12 DIAGNOSIS — I10 Essential (primary) hypertension: Secondary | ICD-10-CM | POA: Diagnosis not present

## 2022-09-12 DIAGNOSIS — Z8546 Personal history of malignant neoplasm of prostate: Secondary | ICD-10-CM | POA: Diagnosis not present

## 2022-09-12 NOTE — Therapy (Signed)
OUTPATIENT PHYSICAL THERAPY RECERT NOTE   Patient Name: Henry Holland MRN: [id number] DOB:01/18/1933, 89 y.o., male Today's Date: 09/12/2022   PT End of Session - 09/12/22 1105     Visit Number 8    Date for PT Re-Evaluation 10/10/22    Authorization Type AETNA MEDICARE    PT Start Time 1100    PT Stop Time 1150    PT Time Calculation (min) 50 min    Activity Tolerance Patient tolerated treatment well    Behavior During Therapy [facility] for tasks assessed/performed              Past Medical History:  Diagnosis Date   Arthritis    Chronic atrial fibrillation ([place])    Dysrhythmia    History of radiation therapy    Hyperlipidemia    Hypertension    Prostate cancer [facility])    Past Surgical History:  Procedure Laterality Date   HERNIA REPAIR     X2   Patient Active Problem List   Diagnosis Date Noted   Lumbar spondylosis 01/03/2021   Encounter for general adult medical examination without abnormal findings 10/14/2020   Hypercoagulable state ([place]) 10/14/2020   Long term (current) use of anticoagulants 10/14/2020   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   Lumbar radiculopathy 08/27/2018   Rupture of right rotator cuff 11/22/2015   Diabetic renal disease ([place]) 09/23/2012   Acquired hallux valgus 09/05/2012   Thrombocytopenia ([place]) 09/11/2011   Localized, primary osteoarthritis of hand 05/12/2011   Hypertension    Chronic atrial fibrillation (HCC)    Prostate cancer ([place])    Hyperlipidemia    History of malignant neoplasm of prostate 07/20/2010   Age-related osteoporosis without current pathological fracture 10/25/2009   Diabetic peripheral neuropathy associated with type 2 diabetes mellitus ([place]) 10/25/2009   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   Mitral valve disorder 08/28/1958    PCP: Ginger Organ., MD  REFERRING PROVIDER: Consuella Lose, MD   REFERRING DIAG: S22.080A T12 compression fracture  Rationale for Evaluation and Treatment:  Rehabilitation  THERAPY DIAG:  Other low back pain  Pain in right leg  Difficulty in walking, not elsewhere classified  Muscle weakness (generalized)  Cramp and spasm  Abnormal posture  ONSET DATE: 06/22/2022   SUBJECTIVE:                                                                                                                                                                                           SUBJECTIVE STATEMENT: Patient states he is beginning to feel the improvement that he desires.  "I still have pain at night and when I  first get up in the morning.  I just can't get comfortable in the bed.  But overall, I do feel like I'm making improvements"   PERTINENT HISTORY:  Hx of L1 compression fracture  PAIN:  Are you having pain? Yes: NPRS scale: 5/10 Pain location: low back and right leg Pain description: aching/sharp Aggravating factors: standing Relieving factors: lying down  PRECAUTIONS: Fall  WEIGHT BEARING RESTRICTIONS: No  FALLS:  Has patient fallen in last 6 months? Yes. Number of falls 1  LIVING ENVIRONMENT: Lives with: lives with their spouse Lives in: House/apartment Stairs: Yes: Internal: 12 steps; can reach both and External: 4 steps; can reach both Has following equipment at home: None  OCCUPATION: Retired  PLOF: Independent, Independent with basic ADLs, Independent with household mobility without device, Independent with community mobility without device, Independent with homemaking with ambulation, Independent with gait, and Independent with transfers  PATIENT GOALS: to be able to do my usual activities without pain   OBJECTIVE:   DIAGNOSTIC FINDINGS:  IMPRESSION: 1. Age indeterminate but possibly subacute wedge compression fracture of T12 with approximately 50% height loss and moderate bone marrow edema. No retropulsion. 2. Severe spinal canal stenosis and bilateral neural foraminal stenosis at L4-L5 due to combination of disc  bulge, synovial cyst and facet arthrosis. 3. Moderate spinal canal stenosis and moderate right neural foraminal stenosis at L3-L4. 4. Severe right and moderate left L2-L3 neural foraminal stenosis  PATIENT SURVEYS:  06/29/22 FOTO: 51 (goal is 18) 08/15/22 FOTO: 55  SCREENING FOR RED FLAGS: Bowel or bladder incontinence: No Spinal tumors: No Cauda equina syndrome: No Compression fracture: No Abdominal aneurysm: No  COGNITION: Overall cognitive status: Within functional limits for tasks assessed     SENSATION: WFL  MUSCLE LENGTH: Hamstrings: Right 50 deg; Left 50 deg Hamstrings: 08/15/22 improved to approx 60 degrees bilaterally Thomas test: Right pos ; Left pos   POSTURE: rounded shoulders, forward head, decreased lumbar lordosis, increased thoracic kyphosis, and posterior pelvic tilt   LUMBAR ROM:   AROM eval 08/15/22  Flexion WNL WNL  Extension 25% 25%  Right lateral flexion Fingertips to mid thigh Fingertips to joint line  Left lateral flexion Fingertips to mid thigh Fingertips to joint line  Right rotation Immediate pain - limited to 25% 75% without pain  Left rotation 50% 75%    (Blank rows = not tested)  LOWER EXTREMITY ROM:     WNL  LOWER EXTREMITY MMT:    Initial eval: Generally 4+/5 with exception of right hip flexion 3/5, right knee extension 3+/5, right hip ext 3/5, right hip abd 3/5 08/15/22: Right hip flexion 4/5, right knee extension 4/5, right hip ext 4-/5, right hip abd 3+/5  LUMBAR SPECIAL TESTS:  Straight leg raise test: Negative  FUNCTIONAL TESTS:  Initial eval: 5 times sit to stand: 24.07 sec Timed up and go (TUG): 13.00  08/15/22: 5 times sit to stand: 17.25 sec Timed up and go (TUG): 8.03 sec  GAIT: Distance walked: 30 Assistive device utilized: None Level of assistance: Complete Independence Comments: antalgic  TODAY'S TREATMENT:           Date: 09/12/21 NuStep x 9 min level 3 (PT present to discuss status) Standing  hamstring stretch 3 x 30 sec each LE Standing hip flexor stretch 3 x 30 sec each LE Hook lying lower trunk rotation x 10 holding 10 sec each side  Supine IT band stretch 3 x 30 sec Hook lying PPT x 20 PPT with 90/90 heel tap  x 20 PPT with dying bug x 20 Seated piriformis and hip IR's stretch 2 x 30 sec each side Ice x 10 min in hook lying to lumbar spine   Date: 09/05/21 NuStep x 9 min level 3 (PT present to discuss status) Standing hamstring stretch 3 x 30 sec each LE Standing hip flexor stretch 3 x 30 sec each LE Hook lying lower trunk rotation x 10 holding 10 sec each side  Hook lying PPT x 20 PPT with 90/90 heel tap x 20 PPT with dying bug x 20 PPT with SLR to shoulder extension with yellow plyo ball x 20 each LE Hook lying piriformis and hip IR's stretch 2 x 30 sec each side Pt declined Ice "I'm going to go walk on the treadmill at [employer] for a little bit"  Date: 08/22/22 NuStep x 7 min level 3 (PT present to discuss status) Standing hamstring stretch 2 x 30 sec each LE Standing hip flexor stretch 2 x 30 sec each LE Hook lying lower trunk rotation x 10 holding 10 sec each side  Hook lying PPT x 20 PPT with 90/90 heel tap x 20 PPT with dying bug x 20 PPT with SLR to shoulder extension with yellow plyo ball x 20 each LE Hook lying piriformis and hip IR's stretch 2 x 30 sec each side Supine IT band stretch 2 x 30 sec  Pt declined Ice "I'm going to go walk on the treadmill at [employer] for a little bit"    PATIENT EDUCATION:  Education details: Initiated HEP, educated on anatomy of the spine and the nature of his condition Person educated: Patient Education method: Consulting civil engineer, Media planner, Verbal cues, and Handouts Education comprehension: verbalized understanding, returned demonstration, verbal cues required, and needs further education  HOME EXERCISE PROGRAM: Access Code: [email or web address] URL: [email or web address] Date: 06/23/2022 Prepared by:  Candyce Churn  Exercises - Standing Hamstring Stretch on Chair  - 1 x daily - 7 x weekly - 1 sets - 3 reps - 30 hold - Quadricep Stretch with Chair and Counter Support  - 1 x daily - 7 x weekly - 1 sets - 3 reps - 30 hold - Supine Piriformis Stretch with Foot on Ground  - 1 x daily - 7 x weekly - 1 sets - 3 reps - 30 hold - Supine Figure 4 Piriformis Stretch  - 1 x daily - 7 x weekly - 1 sets - 3 reps - 30 hold - Lower Trunk Rotations  - 1 x daily - 7 x weekly - 1 sets - 10 reps  ASSESSMENT:  CLINICAL IMPRESSION: Mr. Esselman is beginning to see the positive effects from doing the exercises properly and consistently.   He had less tingling with standing hip flexor stretching.  He was able to complete dying bug with proper technique indicating improved compliance with HEP.  Patient will benefit from skilled PT to address the below impairments and improve overall function.   OBJECTIVE IMPAIRMENTS: decreased activity tolerance, decreased knowledge of condition, decreased mobility, difficulty walking, decreased ROM, decreased strength, decreased safety awareness, increased fascial restrictions, increased muscle spasms, impaired flexibility, and pain.   ACTIVITY LIMITATIONS: carrying, lifting, bending, sitting, standing, squatting, sleeping, stairs, transfers, bed mobility, and dressing  PARTICIPATION LIMITATIONS: meal prep, cleaning, laundry, shopping, community activity, and yard work  PERSONAL FACTORS: Age, Fitness, and Past/current experiences are also affecting patient's functional outcome.   REHAB POTENTIAL: Good  CLINICAL DECISION MAKING: Stable/uncomplicated  EVALUATION COMPLEXITY: Low   GOALS: Goals reviewed  with patient? Yes  SHORT TERM GOALS: Target date: 07/21/2022  Patient will be independent with initial HEP  Baseline: Goal status: MET  2.  Pain report to be no greater than 4/10  Baseline: 7/10 Goal status: IN PROGRESS   LONG TERM GOALS: Target date:  10/10/2021  Patient to be independent with advanced HEP  Baseline:  Goal status: IN PROGRESS  2.  Patient to report pain no greater than 2/10  Baseline: 7/10 Goal status: IN PROGRESS  3.  Functional tests to improve by 5 seconds (5 times sit to stand), and 3 seconds TUG Baseline:  Goal status: MET 08/15/22  4.  Patient to be able to walk 1 mile without needing to stop and rest due to back pain Baseline:  Goal status: IN PROGRESS  5.  Radicular symptoms in leg to be centralized Baseline:  Goal status: IN PROGRESS (more intermittent than constant now 08-15-22)  6.  Patient to be able to resume his outdoor chores including mowing his lawn Baseline:  Goal status: IN PROGRESS  PLAN:  PT FREQUENCY: Recertifying for 5-3Z/SMOL  PT DURATION: for an additional 8 weeks as of 08/15/22  PLANNED INTERVENTIONS: Therapeutic exercises, Therapeutic activity, Neuromuscular re-education, Balance training, Gait training, Patient/Family education, Self Care, Joint mobilization, Stair training, DME instructions, Aquatic Therapy, Dry Needling, Electrical stimulation, Cryotherapy, Moist heat, Taping, Ultrasound, Ionotophoresis '4mg'$ /ml Dexamethasone, Manual therapy, and Re-evaluation.  PLAN FOR NEXT SESSION:   Nu Step, focus on core strengthening, flexibility, manual to address pain.  DN to low back when indicated.    Anderson Malta B. Mendi Constable, PT 09/12/22 1:22 PM   [facility] [address], [place] [address] [place], [place] [address] Phone # [phone] Fax [phone]

## 2022-09-14 DIAGNOSIS — M5416 Radiculopathy, lumbar region: Secondary | ICD-10-CM | POA: Diagnosis not present

## 2022-09-15 ENCOUNTER — Ambulatory Visit: Payer: Medicare HMO

## 2022-09-15 DIAGNOSIS — M6281 Muscle weakness (generalized): Secondary | ICD-10-CM | POA: Diagnosis not present

## 2022-09-15 DIAGNOSIS — M79604 Pain in right leg: Secondary | ICD-10-CM

## 2022-09-15 DIAGNOSIS — R293 Abnormal posture: Secondary | ICD-10-CM

## 2022-09-15 DIAGNOSIS — M5459 Other low back pain: Secondary | ICD-10-CM

## 2022-09-15 DIAGNOSIS — R252 Cramp and spasm: Secondary | ICD-10-CM | POA: Diagnosis not present

## 2022-09-15 DIAGNOSIS — R262 Difficulty in walking, not elsewhere classified: Secondary | ICD-10-CM | POA: Diagnosis not present

## 2022-09-15 NOTE — Therapy (Signed)
OUTPATIENT PHYSICAL THERAPY RECERT NOTE   Patient Name: Henry Holland MRN: [id number] DOB:[birth year], 89 y.o., male Today's Date: 09/15/2022   PT End of Session - 09/15/22 0840     Visit Number 9    Date for PT Re-Evaluation 10/10/22    Authorization Type AETNA MEDICARE    Progress Note Due on Visit 10    PT Start Time 0843    PT Stop Time 0932    PT Time Calculation (min) 49 min    Activity Tolerance Patient tolerated treatment well    Behavior During Therapy [facility] for tasks assessed/performed              Past Medical History:  Diagnosis Date   Arthritis    Chronic atrial fibrillation ([place])    Dysrhythmia    History of radiation therapy    Hyperlipidemia    Hypertension    Prostate cancer 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   Lumbar radiculopathy 08/27/2018   Rupture of right rotator cuff 11/22/2015   Diabetic renal disease ([place]) 09/23/2012   Acquired hallux valgus 09/05/2012   Thrombocytopenia ([place]) 09/11/2011   Localized, primary osteoarthritis of hand 05/12/2011   Hypertension    Chronic atrial fibrillation (HCC)    Prostate cancer ([place])    Hyperlipidemia    History of malignant neoplasm of prostate 07/20/2010   Age-related osteoporosis without current pathological fracture 10/25/2009   Diabetic peripheral neuropathy associated with type 2 diabetes mellitus ([place]) 10/25/2009   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   Mitral valve disorder 08/28/1958    PCP: Ginger Organ., MD  REFERRING PROVIDER: Consuella Lose, MD   REFERRING DIAG: S22.080A T12 compression fracture  Rationale  for Evaluation and Treatment: Rehabilitation  THERAPY DIAG:  Other low back pain  Pain in right leg  Difficulty in walking, not elsewhere classified  Muscle weakness (generalized)  Cramp and spasm  Abnormal posture  ONSET DATE: 06/22/2022   SUBJECTIVE:                                                                                                                                                                                           SUBJECTIVE STATEMENT: Patient states he saw MD for f/u.  MD suggests a second ESI  to see if this will further control his start up pain.  Patient also is not taking any form of pain med and MD would like for him to take Tylenol arthritis.  Patient reports he is continuing to see improvements but still has the post rest start up pain that can feel very debilitating at times.    PERTINENT HISTORY:  Hx of L1 compression fracture  PAIN:  Are you having pain? Yes: NPRS scale: 5/10 Pain location: low back and right leg Pain description: aching/sharp Aggravating factors: standing Relieving factors: lying down  PRECAUTIONS: Fall  WEIGHT BEARING RESTRICTIONS: No  FALLS:  Has patient fallen in last 6 months? Yes. Number of falls 1  LIVING ENVIRONMENT: Lives with: lives with their spouse Lives in: House/apartment Stairs: Yes: Internal: 12 steps; can reach both and External: 4 steps; can reach both Has following equipment at home: None  OCCUPATION: Retired  PLOF: Independent, Independent with basic ADLs, Independent with household mobility without device, Independent with community mobility without device, Independent with homemaking with ambulation, Independent with gait, and Independent with transfers  PATIENT GOALS: to be able to do my usual activities without pain   OBJECTIVE:   DIAGNOSTIC FINDINGS:  IMPRESSION: 1. Age indeterminate but possibly subacute wedge compression fracture of T12 with approximately 50% height loss and moderate  bone marrow edema. No retropulsion. 2. Severe spinal canal stenosis and bilateral neural foraminal stenosis at L4-L5 due to combination of disc bulge, synovial cyst and facet arthrosis. 3. Moderate spinal canal stenosis and moderate right neural foraminal stenosis at L3-L4. 4. Severe right and moderate left L2-L3 neural foraminal stenosis  PATIENT SURVEYS:  06/29/22 FOTO: 103 (goal is 48) 08/15/22 FOTO: 68  SCREENING FOR RED FLAGS: Bowel or bladder incontinence: No Spinal tumors: No Cauda equina syndrome: No Compression fracture: No Abdominal aneurysm: No  COGNITION: Overall cognitive status: Within functional limits for tasks assessed     SENSATION: WFL  MUSCLE LENGTH: Hamstrings: Right 50 deg; Left 50 deg Hamstrings: 08/15/22 improved to approx 60 degrees bilaterally Thomas test: Right pos ; Left pos   POSTURE: rounded shoulders, forward head, decreased lumbar lordosis, increased thoracic kyphosis, and posterior pelvic tilt   LUMBAR ROM:   AROM eval 08/15/22  Flexion WNL WNL  Extension 25% 25%  Right lateral flexion Fingertips to mid thigh Fingertips to joint line  Left lateral flexion Fingertips to mid thigh Fingertips to joint line  Right rotation Immediate pain - limited to 25% 75% without pain  Left rotation 50% 75%    (Blank rows = not tested)  LOWER EXTREMITY ROM:     WNL  LOWER EXTREMITY MMT:    Initial eval: Generally 4+/5 with exception of right hip flexion 3/5, right knee extension 3+/5, right hip ext 3/5, right hip abd 3/5 08/15/22: Right hip flexion 4/5, right knee extension 4/5, right hip ext 4-/5, right hip abd 3+/5  LUMBAR SPECIAL TESTS:  Straight leg raise test: Negative  FUNCTIONAL TESTS:  Initial eval: 5 times sit to stand: 24.07 sec Timed up and go (TUG): 13.00  08/15/22: 5 times sit to stand: 17.25 sec Timed up and go (TUG): 8.03 sec  GAIT: Distance walked: 30 Assistive device utilized: None Level of assistance: Complete  Independence Comments: antalgic  TODAY'S TREATMENT:           Date: 09/15/21 NuStep x 8 min level 3 (PT present to discuss status) Standing hamstring stretch 3 x 30 sec each LE Standing hip flexor stretch 3 x 30 sec  each LE Hook lying lower trunk rotation x 10 holding 10 sec each side  Supine IT band stretch 3 x 30 sec Hook lying PPT x 20 PPT with 90/90 heel tap x 20 PPT with dying bug x 20 Seated piriformis and hip IR's stretch 2 x 30 sec each side Ice x 10 min in hook lying to lumbar spine  Date: 09/12/21 NuStep x 9 min level 3 (PT present to discuss status) Standing hamstring stretch 3 x 30 sec each LE Standing hip flexor stretch 3 x 30 sec each LE Hook lying lower trunk rotation x 10 holding 10 sec each side  Supine IT band stretch 3 x 30 sec Hook lying PPT x 20 PPT with 90/90 heel tap x 20 PPT with dying bug x 20 Seated piriformis and hip IR's stretch 2 x 30 sec each side Ice x 10 min in hook lying to lumbar spine   Date: 09/05/21 NuStep x 9 min level 3 (PT present to discuss status) Standing hamstring stretch 3 x 30 sec each LE Standing hip flexor stretch 3 x 30 sec each LE Hook lying lower trunk rotation x 10 holding 10 sec each side  Hook lying PPT x 20 PPT with 90/90 heel tap x 20 PPT with dying bug x 20 PPT with SLR to shoulder extension with yellow plyo ball x 20 each LE Hook lying piriformis and hip IR's stretch 2 x 30 sec each side Pt declined Ice "I'm going to go walk on the treadmill at [employer] for a little bit"  PATIENT EDUCATION:  Education details: Initiated HEP, educated on anatomy of the spine and the nature of his condition Person educated: Patient Education method: Consulting civil engineer, Media planner, Verbal cues, and Handouts Education comprehension: verbalized understanding, returned demonstration, verbal cues required, and needs further education  HOME EXERCISE PROGRAM: Access Code: [email or web address] URL: [email or web address] Date:  06/23/2022 Prepared by: Candyce Churn  Exercises - Standing Hamstring Stretch on Chair  - 1 x daily - 7 x weekly - 1 sets - 3 reps - 30 hold - Quadricep Stretch with Chair and Counter Support  - 1 x daily - 7 x weekly - 1 sets - 3 reps - 30 hold - Supine Piriformis Stretch with Foot on Ground  - 1 x daily - 7 x weekly - 1 sets - 3 reps - 30 hold - Supine Figure 4 Piriformis Stretch  - 1 x daily - 7 x weekly - 1 sets - 3 reps - 30 hold - Lower Trunk Rotations  - 1 x daily - 7 x weekly - 1 sets - 10 reps  ASSESSMENT:  CLINICAL IMPRESSION: Mr. Widmer is progressing well now.  He saw MD for f/u.  MD suggested ESI again to see if this would further control his pain.  Patient also does not take any medication for his pain.  MD suggests he take Tylenol arthritis.  Patient admits he hates to take any medication for pain but will try this.  Overall, his compliance is improving and he reports significant improvement in his pain and function.    Patient will benefit from skilled PT to address the below impairments and improve overall function.   OBJECTIVE IMPAIRMENTS: decreased activity tolerance, decreased knowledge of condition, decreased mobility, difficulty walking, decreased ROM, decreased strength, decreased safety awareness, increased fascial restrictions, increased muscle spasms, impaired flexibility, and pain.   ACTIVITY LIMITATIONS: carrying, lifting, bending, sitting, standing, squatting, sleeping, stairs, transfers, bed mobility, and dressing  PARTICIPATION LIMITATIONS: meal prep,  cleaning, laundry, shopping, community activity, and yard work  PERSONAL FACTORS: Age, Fitness, and Past/current experiences are also affecting patient's functional outcome.   REHAB POTENTIAL: Good  CLINICAL DECISION MAKING: Stable/uncomplicated  EVALUATION COMPLEXITY: Low   GOALS: Goals reviewed with patient? Yes  SHORT TERM GOALS: Target date: 07/21/2022  Patient will be independent with initial HEP   Baseline: Goal status: MET  2.  Pain report to be no greater than 4/10  Baseline: 7/10 Goal status: IN PROGRESS   LONG TERM GOALS: Target date: 10/10/2021  Patient to be independent with advanced HEP  Baseline:  Goal status: IN PROGRESS  2.  Patient to report pain no greater than 2/10  Baseline: 7/10 Goal status: IN PROGRESS  3.  Functional tests to improve by 5 seconds (5 times sit to stand), and 3 seconds TUG Baseline:  Goal status: MET 08/15/22  4.  Patient to be able to walk 1 mile without needing to stop and rest due to back pain Baseline:  Goal status: IN PROGRESS  5.  Radicular symptoms in leg to be centralized Baseline:  Goal status: IN PROGRESS (more intermittent than constant now 08-15-22)  6.  Patient to be able to resume his outdoor chores including mowing his lawn Baseline:  Goal status: IN PROGRESS  PLAN:  PT FREQUENCY: Recertifying for 5-0T/UUEK  PT DURATION: for an additional 8 weeks as of 08/15/22  PLANNED INTERVENTIONS: Therapeutic exercises, Therapeutic activity, Neuromuscular re-education, Balance training, Gait training, Patient/Family education, Self Care, Joint mobilization, Stair training, DME instructions, Aquatic Therapy, Dry Needling, Electrical stimulation, Cryotherapy, Moist heat, Taping, Ultrasound, Ionotophoresis '4mg'$ /ml Dexamethasone, Manual therapy, and Re-evaluation.  PLAN FOR NEXT SESSION:   Nu Step, focus on core strengthening, flexibility, manual to address pain.  DN to low back when indicated.    Anderson Malta B. Alizzon Dioguardi, PT 09/15/22 9:28 AM   [place] [address] [place], [place] [address] Phone # [phone] Fax [phone]

## 2022-09-22 ENCOUNTER — Ambulatory Visit: Payer: Medicare HMO

## 2022-09-22 DIAGNOSIS — R293 Abnormal posture: Secondary | ICD-10-CM

## 2022-09-22 DIAGNOSIS — R262 Difficulty in walking, not elsewhere classified: Secondary | ICD-10-CM | POA: Diagnosis not present

## 2022-09-22 DIAGNOSIS — R252 Cramp and spasm: Secondary | ICD-10-CM

## 2022-09-22 DIAGNOSIS — M6281 Muscle weakness (generalized): Secondary | ICD-10-CM

## 2022-09-22 DIAGNOSIS — M79604 Pain in right leg: Secondary | ICD-10-CM | POA: Diagnosis not present

## 2022-09-22 DIAGNOSIS — M5459 Other low back pain: Secondary | ICD-10-CM | POA: Diagnosis not present

## 2022-09-22 NOTE — Therapy (Signed)
OUTPATIENT PHYSICAL THERAPY RE-ASSESSMENT NOTE  Progress Note Reporting Period 06/23/22 to 09/22/22  See note below for Objective Data and Assessment of Progress/Goals.     Patient Name: Henry Holland MRN: [id number] DOB:04-Aug-1933, 89 y.o., male Today's Date: 09/22/2022   PT End of Session - 09/22/22 0934     Visit Number 10    Date for PT Re-Evaluation 10/10/22    Authorization Type AETNA MEDICARE    Progress Note Due on Visit 10    PT Start Time 0930    PT Stop Time 1015    PT Time Calculation (min) 45 min    Activity Tolerance Patient tolerated treatment well    Behavior During Therapy [facility] for tasks assessed/performed              Past Medical History:  Diagnosis Date   Arthritis    Chronic atrial fibrillation ([place])    Dysrhythmia    History of radiation therapy    Hyperlipidemia    Hypertension    Prostate cancer [facility])    Past Surgical History:  Procedure Laterality Date   HERNIA REPAIR     X2   Patient Active Problem List   Diagnosis Date Noted   Lumbar spondylosis 01/03/2021   Encounter for general adult medical examination without abnormal findings 10/14/2020   Hypercoagulable state ([place]) 10/14/2020   Long term (current) use of anticoagulants 10/14/2020   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   Lumbar radiculopathy 08/27/2018   Rupture of right rotator cuff 11/22/2015   Diabetic renal disease ([place]) 09/23/2012   Acquired hallux valgus 09/05/2012   Thrombocytopenia ([place]) 09/11/2011   Localized, primary osteoarthritis of hand 05/12/2011   Hypertension    Chronic atrial fibrillation (HCC)    Prostate cancer ([place])    Hyperlipidemia    History of malignant neoplasm of prostate 07/20/2010   Age-related osteoporosis without current pathological fracture 10/25/2009   Diabetic peripheral neuropathy associated with type 2 diabetes mellitus ([place]) 10/25/2009   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   Mitral valve disorder 08/28/1958    PCP:  Ginger Organ., MD  REFERRING PROVIDER: Consuella Lose, MD   REFERRING DIAG: S22.080A T12 compression fracture  Rationale for Evaluation and Treatment: Rehabilitation  THERAPY DIAG:  Other low back pain  Pain in right leg  Difficulty in walking, not elsewhere classified  Muscle weakness (generalized)  Cramp and spasm  Abnormal posture  ONSET DATE: 06/22/2022   SUBJECTIVE:  SUBJECTIVE STATEMENT: Patient is just now reaching his 10th visit.  He reports he is about 30% better.  He is scheduled for ESI at beginning of February.  He feels he is managing his pain better and feeling like the exercises and stretching are helping.  He is taking less pain medication and his leg pain is better and more infrequent. He is more consistent and compliant with his HEP and attending the gym.  He should benefit from [facility] and therapy will likely be more beneficial.  Patient reports he is continuing to see improvements but still has the post rest start up pain that can feel very debilitating at times.    PERTINENT HISTORY:  Hx of L1 compression fracture  PAIN:  Are you having pain? Yes: NPRS scale: 5/10 Pain location: low back and right leg Pain description: aching/sharp Aggravating factors: standing Relieving factors: lying down  PRECAUTIONS: Fall  WEIGHT BEARING RESTRICTIONS: No  FALLS:  Has patient fallen in last 6 months? Yes. Number of falls 1  LIVING ENVIRONMENT: Lives with: lives with their spouse Lives in: House/apartment Stairs: Yes: Internal: 12 steps; can reach both and External: 4 steps; can reach both Has following equipment at home: None  OCCUPATION: Retired  PLOF: Independent, Independent with basic ADLs, Independent with household mobility without device, Independent with  community mobility without device, Independent with homemaking with ambulation, Independent with gait, and Independent with transfers  PATIENT GOALS: to be able to do my usual activities without pain   OBJECTIVE:   DIAGNOSTIC FINDINGS:  IMPRESSION: 1. Age indeterminate but possibly subacute wedge compression fracture of T12 with approximately 50% height loss and moderate bone marrow edema. No retropulsion. 2. Severe spinal canal stenosis and bilateral neural foraminal stenosis at L4-L5 due to combination of disc bulge, synovial cyst and facet arthrosis. 3. Moderate spinal canal stenosis and moderate right neural foraminal stenosis at L3-L4. 4. Severe right and moderate left L2-L3 neural foraminal stenosis  PATIENT SURVEYS:  06/29/22 FOTO: 38 (goal is 62) 08/15/22 FOTO: 42 09/22/22 FOTO: 60  SCREENING FOR RED FLAGS: Bowel or bladder incontinence: No Spinal tumors: No Cauda equina syndrome: No Compression fracture: No Abdominal aneurysm: No  COGNITION: Overall cognitive status: Within functional limits for tasks assessed     SENSATION: WFL  MUSCLE LENGTH: Hamstrings: Right 50 deg; Left 50 deg Hamstrings: 08/15/22 improved to approx 60 degrees bilaterally Thomas test: Right pos ; Left pos   POSTURE: rounded shoulders, forward head, decreased lumbar lordosis, increased thoracic kyphosis, and posterior pelvic tilt   LUMBAR ROM:   AROM eval 08/15/22  Flexion WNL WNL  Extension 25% 25%  Right lateral flexion Fingertips to mid thigh Fingertips to joint line  Left lateral flexion Fingertips to mid thigh Fingertips to joint line  Right rotation Immediate pain - limited to 25% 75% without pain  Left rotation 50% 75%    (Blank rows = not tested)  LOWER EXTREMITY ROM:     WNL  LOWER EXTREMITY MMT:    Initial eval: Generally 4+/5 with exception of right hip flexion 3/5, right knee extension 3+/5, right hip ext 3/5, right hip abd 3/5 08/15/22: Right hip flexion 4/5,  right knee extension 4/5, right hip ext 4-/5, right hip abd 3+/5  LUMBAR SPECIAL TESTS:  Straight leg raise test: Negative  FUNCTIONAL TESTS:  Initial eval: 5 times sit to stand: 24.07 sec Timed up and go (TUG): 13.00  08/15/22: 5 times sit to stand: 17.25 sec Timed up and go (TUG): 8.03 sec  09/22/22: 5 times sit to stand: 13.88 sec Timed up and go (TUG): 7.93 sec  GAIT: Distance walked: 30 Assistive device utilized: None Level of assistance: Complete Independence Comments: antalgic  TODAY'S TREATMENT:           Date: 09/22/21 NuStep x 8 min level 3 (PT present to discuss status) Standing hamstring stretch 3 x 30 sec each LE Standing hip flexor stretch 3 x 30 sec each LE Progress note and assessment completed: all functional tests and FOTO repeated  Hook lying lower trunk rotation x 10 holding 10 sec each side  Hook lying PPT x 20 PPT with 90/90 heel tap x 20  Date: 09/15/21 NuStep x 8 min level 3 (PT present to discuss status) Standing hamstring stretch 3 x 30 sec each LE Standing hip flexor stretch 3 x 30 sec each LE Hook lying lower trunk rotation x 10 holding 10 sec each side  Supine IT band stretch 3 x 30 sec Hook lying PPT x 20 PPT with 90/90 heel tap x 20 PPT with dying bug x 20 Seated piriformis and hip IR's stretch 2 x 30 sec each side Ice x 10 min in hook lying to lumbar spine  Date: 09/12/21 NuStep x 9 min level 3 (PT present to discuss status) Standing hamstring stretch 3 x 30 sec each LE Standing hip flexor stretch 3 x 30 sec each LE Hook lying lower trunk rotation x 10 holding 10 sec each side  Supine IT band stretch 3 x 30 sec Hook lying PPT x 20 PPT with 90/90 heel tap x 20 PPT with dying bug x 20 Seated piriformis and hip IR's stretch 2 x 30 sec each side Ice x 10 min in hook lying to lumbar spine   PATIENT EDUCATION:  Education details: Initiated HEP, educated on anatomy of the spine and the nature of his condition Person educated:  Patient Education method: Consulting civil engineer, Media planner, Verbal cues, and Handouts Education comprehension: verbalized understanding, returned demonstration, verbal cues required, and needs further education  HOME EXERCISE PROGRAM: Access Code: [email or web address] URL: [email or web address] Date: 06/23/2022 Prepared by: Candyce Churn  Exercises - Standing Hamstring Stretch on Chair  - 1 x daily - 7 x weekly - 1 sets - 3 reps - 30 hold - Quadricep Stretch with Chair and Counter Support  - 1 x daily - 7 x weekly - 1 sets - 3 reps - 30 hold - Supine Piriformis Stretch with Foot on Ground  - 1 x daily - 7 x weekly - 1 sets - 3 reps - 30 hold - Supine Figure 4 Piriformis Stretch  - 1 x daily - 7 x weekly - 1 sets - 3 reps - 30 hold - Lower Trunk Rotations  - 1 x daily - 7 x weekly - 1 sets - 10 reps  ASSESSMENT:  CLINICAL IMPRESSION: Mr. Basnett is progressing appropriately.  Patient is just now reaching his 10th visit.  He reports he is about 30% better. He will have ESI at beginning of February.   He is taking less pain medication and his leg pain is better and more infrequent. He is more consistent and compliant with his HEP and attending the gym.  He should benefit from [facility] and therapy will likely be more beneficial.   Patient will benefit from continuing skilled PT to address the below impairments and improve overall function.   OBJECTIVE IMPAIRMENTS: decreased activity tolerance, decreased knowledge of condition, decreased mobility, difficulty walking, decreased ROM, decreased strength, decreased safety  awareness, increased fascial restrictions, increased muscle spasms, impaired flexibility, and pain.   ACTIVITY LIMITATIONS: carrying, lifting, bending, sitting, standing, squatting, sleeping, stairs, transfers, bed mobility, and dressing  PARTICIPATION LIMITATIONS: meal prep, cleaning, laundry, shopping, community activity, and yard work  PERSONAL FACTORS: Age, Fitness, and Past/current  experiences are also affecting patient's functional outcome.   REHAB POTENTIAL: Good  CLINICAL DECISION MAKING: Stable/uncomplicated  EVALUATION COMPLEXITY: Low   GOALS: Goals reviewed with patient? Yes  SHORT TERM GOALS: Target date: 07/21/2022  Patient will be independent with initial HEP  Baseline: Goal status: MET  2.  Pain report to be no greater than 4/10  Baseline: 7/10 Goal status: MET   LONG TERM GOALS: Target date: 10/10/2021  Patient to be independent with advanced HEP  Baseline:  Goal status: IN PROGRESS  2.  Patient to report pain no greater than 2/10  Baseline: 7/10 Goal status: IN PROGRESS  3.  Functional tests to improve by 5 seconds (5 times sit to stand), and 3 seconds TUG Baseline:  Goal status: MET 08/15/22  4.  Patient to be able to walk 1 mile without needing to stop and rest due to back pain Baseline:  Goal status: MET  5.  Radicular symptoms in leg to be centralized Baseline:  Goal status: IN PROGRESS (more intermittent than constant now 08-15-22)  6.  Patient to be able to resume his outdoor chores including mowing his lawn Baseline:  Goal status: IN PROGRESS  PLAN:  PT FREQUENCY: Recertifying for 1-2K/SKSH  PT DURATION: for an additional 8 weeks as of 08/15/22  PLANNED INTERVENTIONS: Therapeutic exercises, Therapeutic activity, Neuromuscular re-education, Balance training, Gait training, Patient/Family education, Self Care, Joint mobilization, Stair training, DME instructions, Aquatic Therapy, Dry Needling, Electrical stimulation, Cryotherapy, Moist heat, Taping, Ultrasound, Ionotophoresis '4mg'$ /ml Dexamethasone, Manual therapy, and Re-evaluation.  PLAN FOR NEXT SESSION:   Nu Step, focus on core strengthening, flexibility, manual to address pain.  DN to low back when indicated.    Anderson Malta B. Eraina Winnie, PT 09/22/22 10:28 AM  [facility] [address], [place], [place] [address] Phone #  [phone] Fax [phone]

## 2022-09-26 ENCOUNTER — Ambulatory Visit: Payer: Medicare HMO

## 2022-09-26 DIAGNOSIS — R262 Difficulty in walking, not elsewhere classified: Secondary | ICD-10-CM | POA: Diagnosis not present

## 2022-09-26 DIAGNOSIS — R252 Cramp and spasm: Secondary | ICD-10-CM

## 2022-09-26 DIAGNOSIS — M6281 Muscle weakness (generalized): Secondary | ICD-10-CM | POA: Diagnosis not present

## 2022-09-26 DIAGNOSIS — M5459 Other low back pain: Secondary | ICD-10-CM

## 2022-09-26 DIAGNOSIS — M79604 Pain in right leg: Secondary | ICD-10-CM | POA: Diagnosis not present

## 2022-09-26 DIAGNOSIS — R293 Abnormal posture: Secondary | ICD-10-CM | POA: Diagnosis not present

## 2022-09-26 NOTE — Therapy (Addendum)
OUTPATIENT PHYSICAL THERAPY TREATMENT NOTE PHYSICAL THERAPY DISCHARGE SUMMARY  Visits from Start of Care: 11  Current functional level related to goals / functional outcomes: See below     Remaining deficits: See below   Education / Equipment: See below   Patient agrees to discharge. Patient goals were partially met. Patient is being discharged due to not returning since the last visit.      Patient Name: Henry Holland MRN: [id number] DOB:[birth year], 89 y.o., male Today's Date: 09/26/2022   PT End of Session - 09/26/22 0935     Visit Number 11    Date for PT Re-Evaluation 10/10/22    Authorization Type [employer] MEDICARE    Progress Note Due on Visit 20    PT Start Time 0930    Activity Tolerance Patient tolerated treatment well    Behavior During Therapy [facility] for tasks assessed/performed              Past Medical History:  Diagnosis Date   Arthritis    Chronic atrial fibrillation ([place])    Dysrhythmia    History of radiation therapy    Hyperlipidemia    Hypertension    Prostate cancer [facility])    Past Surgical History:  Procedure Laterality Date   HERNIA REPAIR     X2   Patient Active Problem List   Diagnosis Date Noted   Lumbar spondylosis 01/03/2021   Encounter for general adult medical examination without abnormal findings 10/14/2020   Hypercoagulable state ([place]) 10/14/2020   Long term (current) use of anticoagulants 10/14/2020   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   Lumbar radiculopathy 08/27/2018   Rupture of right rotator cuff 11/22/2015   Diabetic renal disease ([place]) 09/23/2012   Acquired hallux valgus 09/05/2012   Thrombocytopenia ([place]) 09/11/2011   Localized, primary osteoarthritis of hand 05/12/2011   Hypertension    Chronic atrial fibrillation ([facility])    Prostate cancer ([place])    Hyperlipidemia    History of malignant neoplasm of prostate 07/20/2010   Age-related osteoporosis without current pathological fracture 10/25/2009   Diabetic peripheral  neuropathy associated with type 2 diabetes mellitus ([place]) 10/25/2009   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   Mitral valve disorder 08/28/1958    PCP: Ginger Organ., MD  REFERRING PROVIDER: Consuella Lose, MD   REFERRING DIAG: S22.080A T12 compression fracture  Rationale for Evaluation and Treatment: Rehabilitation  THERAPY DIAG:  Other low back pain  Pain in right leg  Difficulty in walking, not elsewhere classified  Muscle weakness (generalized)  Cramp and spasm  Abnormal posture  ONSET DATE: 06/22/2022   SUBJECTIVE:  SUBJECTIVE STATEMENT: Patient reports he did well with exception of Saturday night.  He went to a funeral and stood around a lot.  He had taken 2 Tylenol prior to going but had a lot of pain upon returning home. He explains he did ice and got the pain level back down to manageable.  He will be having the [facility] on Feb 6.  He still gets pain with turning over in bed but maintains that he is better than when he started PT.   He is more consistent and compliant with his HEP and attending the gym.  He should benefit from [facility] and therapy will likely be more beneficial.  Patient reports he is continuing to see improvements but still has the post rest start up pain that can feel very debilitating at times.    PERTINENT HISTORY:  Hx of L1 compression fracture  PAIN:  Are you having pain? Yes: NPRS scale: 5/10 Pain location: low back and right leg Pain description: aching/sharp Aggravating factors: standing Relieving factors: lying down  PRECAUTIONS: Fall  WEIGHT BEARING RESTRICTIONS: No  FALLS:  Has patient fallen in last 6 months? Yes. Number of falls 1  LIVING ENVIRONMENT: Lives with: lives with their spouse Lives in: House/apartment Stairs: Yes:  Internal: 12 steps; can reach both and External: 4 steps; can reach both Has following equipment at home: None  OCCUPATION: Retired  PLOF: Independent, Independent with basic ADLs, Independent with household mobility without device, Independent with community mobility without device, Independent with homemaking with ambulation, Independent with gait, and Independent with transfers  PATIENT GOALS: to be able to do my usual activities without pain   OBJECTIVE:   DIAGNOSTIC FINDINGS:  IMPRESSION: 1. Age indeterminate but possibly subacute wedge compression fracture of T12 with approximately 50% height loss and moderate bone marrow edema. No retropulsion. 2. Severe spinal canal stenosis and bilateral neural foraminal stenosis at L4-L5 due to combination of disc bulge, synovial cyst and facet arthrosis. 3. Moderate spinal canal stenosis and moderate right neural foraminal stenosis at L3-L4. 4. Severe right and moderate left L2-L3 neural foraminal stenosis  PATIENT SURVEYS:  06/29/22 FOTO: 26 (goal is 38) 08/15/22 FOTO: 42 09/22/22 FOTO: 60  SCREENING FOR RED FLAGS: Bowel or bladder incontinence: No Spinal tumors: No Cauda equina syndrome: No Compression fracture: No Abdominal aneurysm: No  COGNITION: Overall cognitive status: Within functional limits for tasks assessed     SENSATION: WFL  MUSCLE LENGTH: Hamstrings: Right 50 deg; Left 50 deg Hamstrings: 08/15/22 improved to approx 60 degrees bilaterally Thomas test: Right pos ; Left pos   POSTURE: rounded shoulders, forward head, decreased lumbar lordosis, increased thoracic kyphosis, and posterior pelvic tilt   LUMBAR ROM:   AROM eval 08/15/22  Flexion WNL WNL  Extension 25% 25%  Right lateral flexion Fingertips to mid thigh Fingertips to joint line  Left lateral flexion Fingertips to mid thigh Fingertips to joint line  Right rotation Immediate pain - limited to 25% 75% without pain  Left rotation 50% 75%     (Blank rows = not tested)  LOWER EXTREMITY ROM:     WNL  LOWER EXTREMITY MMT:    Initial eval: Generally 4+/5 with exception of right hip flexion 3/5, right knee extension 3+/5, right hip ext 3/5, right hip abd 3/5 08/15/22: Right hip flexion 4/5, right knee extension 4/5, right hip ext 4-/5, right hip abd 3+/5  LUMBAR SPECIAL TESTS:  Straight leg raise test: Negative  FUNCTIONAL TESTS:  Initial eval: 5 times sit  to stand: 24.07 sec Timed up and go (TUG): 13.00  08/15/22: 5 times sit to stand: 17.25 sec Timed up and go (TUG): 8.03 sec  09/22/22: 5 times sit to stand: 13.88 sec Timed up and go (TUG): 7.93 sec  GAIT: Distance walked: 30 Assistive device utilized: None Level of assistance: Complete Independence Comments: antalgic  TODAY'S TREATMENT:           Date: 09/26/21 NuStep x 5 min level 3 (PT present to discuss status) Standing hamstring stretch 3 x 30 sec each LE Standing hip flexor stretch 3 x 30 sec each LE Hook lying lower trunk rotation x 10 holding 10 sec each side  Hook lying PPT x 20 PPT with 90/90 heel tap x 20 PPT with dying bug x 20 PPT bicycling x 20 PPT with Shoulder ext to SLR with yellow plyo ball x 10 each LE Seated modified Russian Twist x 20 (10 each side) with yellow plyo ball Modified hollow hold seated with shoulder ext to knee lift x 10 each with yellow plyo ball Ice x 10 min to lumbar spine in hook lying  Date: 09/22/21 NuStep x 8 min level 3 (PT present to discuss status) Standing hamstring stretch 3 x 30 sec each LE Standing hip flexor stretch 3 x 30 sec each LE Progress note and assessment completed: all functional tests and FOTO repeated  Hook lying lower trunk rotation x 10 holding 10 sec each side  Hook lying PPT x 20 PPT with 90/90 heel tap x 20  Date: 09/15/21 NuStep x 8 min level 3 (PT present to discuss status) Standing hamstring stretch 3 x 30 sec each LE Standing hip flexor stretch 3 x 30 sec each LE Hook lying lower  trunk rotation x 10 holding 10 sec each side  Supine IT band stretch 3 x 30 sec Hook lying PPT x 20 PPT with 90/90 heel tap x 20 PPT with dying bug x 20 Seated piriformis and hip IR's stretch 2 x 30 sec each side Ice x 10 min in hook lying to lumbar spine  PATIENT EDUCATION:  Education details: Initiated HEP, educated on anatomy of the spine and the nature of his condition Person educated: Patient Education method: Consulting civil engineer, Media planner, Verbal cues, and Handouts Education comprehension: verbalized understanding, returned demonstration, verbal cues required, and needs further education  HOME EXERCISE PROGRAM: Access Code: [email or web address] URL: [email or web address] Date: 06/23/2022 Prepared by: Candyce Churn  Exercises - Standing Hamstring Stretch on Chair  - 1 x daily - 7 x weekly - 1 sets - 3 reps - 30 hold - Quadricep Stretch with Chair and Counter Support  - 1 x daily - 7 x weekly - 1 sets - 3 reps - 30 hold - Supine Piriformis Stretch with Foot on Ground  - 1 x daily - 7 x weekly - 1 sets - 3 reps - 30 hold - Supine Figure 4 Piriformis Stretch  - 1 x daily - 7 x weekly - 1 sets - 3 reps - 30 hold - Lower Trunk Rotations  - 1 x daily - 7 x weekly - 1 sets - 10 reps  ASSESSMENT:  CLINICAL IMPRESSION: Mr. Erazo is progressing appropriately. He has difficulty maintaining pelvic tilt with core exercises indicating weakness in the transverse abdominus.  He fatigues easily with higher level core exercises.   He should benefit from [facility] and therapy will likely be more beneficial.   Patient will benefit from continuing skilled PT to address the below impairments and  improve overall function.   OBJECTIVE IMPAIRMENTS: decreased activity tolerance, decreased knowledge of condition, decreased mobility, difficulty walking, decreased ROM, decreased strength, decreased safety awareness, increased fascial restrictions, increased muscle spasms, impaired flexibility, and pain.    ACTIVITY LIMITATIONS: carrying, lifting, bending, sitting, standing, squatting, sleeping, stairs, transfers, bed mobility, and dressing  PARTICIPATION LIMITATIONS: meal prep, cleaning, laundry, shopping, community activity, and yard work  PERSONAL FACTORS: Age, Fitness, and Past/current experiences are also affecting patient's functional outcome.   REHAB POTENTIAL: Good  CLINICAL DECISION MAKING: Stable/uncomplicated  EVALUATION COMPLEXITY: Low   GOALS: Goals reviewed with patient? Yes  SHORT TERM GOALS: Target date: 07/21/2022  Patient will be independent with initial HEP  Baseline: Goal status: MET  2.  Pain report to be no greater than 4/10  Baseline: 7/10 Goal status: MET   LONG TERM GOALS: Target date: 10/10/2021  Patient to be independent with advanced HEP  Baseline:  Goal status: IN PROGRESS  2.  Patient to report pain no greater than 2/10  Baseline: 7/10 Goal status: IN PROGRESS  3.  Functional tests to improve by 5 seconds (5 times sit to stand), and 3 seconds TUG Baseline:  Goal status: MET 08/15/22  4.  Patient to be able to walk 1 mile without needing to stop and rest due to back pain Baseline:  Goal status: MET  5.  Radicular symptoms in leg to be centralized Baseline:  Goal status: IN PROGRESS (more intermittent than constant now 08-15-22)  6.  Patient to be able to resume his outdoor chores including mowing his lawn Baseline:  Goal status: IN PROGRESS  PLAN:  PT FREQUENCY: Recertifying for 1-7P/ZWCH  PT DURATION: for an additional 8 weeks as of 08/15/22  PLANNED INTERVENTIONS: Therapeutic exercises, Therapeutic activity, Neuromuscular re-education, Balance training, Gait training, Patient/Family education, Self Care, Joint mobilization, Stair training, DME instructions, Aquatic Therapy, Dry Needling, Electrical stimulation, Cryotherapy, Moist heat, Taping, Ultrasound, Ionotophoresis 4mg /ml Dexamethasone, Manual therapy, and  Re-evaluation.  PLAN FOR NEXT SESSION:   Nu Step, focus on core strengthening, flexibility, manual to address pain.  DN to low back when indicated.   Anderson Malta B. Lydiah Pong, PT 11/10/22 8:20 AM  Anderson Malta B. Dwyane Dupree, PT 09/26/22 10:07 AM  [facility] [address] [place], [place] [address] Phone # [phone] Fax [phone]

## 2022-10-03 DIAGNOSIS — M5416 Radiculopathy, lumbar region: Secondary | ICD-10-CM | POA: Diagnosis not present

## 2022-10-04 ENCOUNTER — Ambulatory Visit: Payer: Medicare HMO

## 2022-10-17 DIAGNOSIS — I480 Paroxysmal atrial fibrillation: Secondary | ICD-10-CM | POA: Diagnosis not present

## 2022-10-17 DIAGNOSIS — D6869 Other thrombophilia: Secondary | ICD-10-CM | POA: Diagnosis not present

## 2022-10-17 DIAGNOSIS — Z7901 Long term (current) use of anticoagulants: Secondary | ICD-10-CM | POA: Diagnosis not present

## 2022-10-26 DIAGNOSIS — M5416 Radiculopathy, lumbar region: Secondary | ICD-10-CM | POA: Diagnosis not present

## 2022-10-30 DIAGNOSIS — M13841 Other specified arthritis, right hand: Secondary | ICD-10-CM | POA: Diagnosis not present

## 2022-10-30 DIAGNOSIS — M79644 Pain in right finger(s): Secondary | ICD-10-CM | POA: Insufficient documentation

## 2022-11-03 DIAGNOSIS — M19049 Primary osteoarthritis, unspecified hand: Secondary | ICD-10-CM | POA: Insufficient documentation

## 2022-11-10 DIAGNOSIS — R079 Chest pain, unspecified: Secondary | ICD-10-CM | POA: Diagnosis not present

## 2022-11-14 DIAGNOSIS — Z7901 Long term (current) use of anticoagulants: Secondary | ICD-10-CM | POA: Diagnosis not present

## 2022-11-14 DIAGNOSIS — I480 Paroxysmal atrial fibrillation: Secondary | ICD-10-CM | POA: Diagnosis not present

## 2022-11-14 DIAGNOSIS — D6869 Other thrombophilia: Secondary | ICD-10-CM | POA: Diagnosis not present

## 2022-12-07 DIAGNOSIS — E119 Type 2 diabetes mellitus without complications: Secondary | ICD-10-CM | POA: Diagnosis not present

## 2022-12-14 DIAGNOSIS — D6869 Other thrombophilia: Secondary | ICD-10-CM | POA: Diagnosis not present

## 2022-12-14 DIAGNOSIS — I480 Paroxysmal atrial fibrillation: Secondary | ICD-10-CM | POA: Diagnosis not present

## 2022-12-14 DIAGNOSIS — Z7901 Long term (current) use of anticoagulants: Secondary | ICD-10-CM | POA: Diagnosis not present

## 2022-12-21 DIAGNOSIS — L812 Freckles: Secondary | ICD-10-CM | POA: Diagnosis not present

## 2022-12-21 DIAGNOSIS — L821 Other seborrheic keratosis: Secondary | ICD-10-CM | POA: Diagnosis not present

## 2022-12-21 DIAGNOSIS — L570 Actinic keratosis: Secondary | ICD-10-CM | POA: Diagnosis not present

## 2022-12-21 DIAGNOSIS — Z85828 Personal history of other malignant neoplasm of skin: Secondary | ICD-10-CM | POA: Diagnosis not present

## 2022-12-21 DIAGNOSIS — L820 Inflamed seborrheic keratosis: Secondary | ICD-10-CM | POA: Diagnosis not present

## 2022-12-21 DIAGNOSIS — D1801 Hemangioma of skin and subcutaneous tissue: Secondary | ICD-10-CM | POA: Diagnosis not present

## 2023-01-09 DIAGNOSIS — M5416 Radiculopathy, lumbar region: Secondary | ICD-10-CM | POA: Diagnosis not present

## 2023-01-16 DIAGNOSIS — Z7901 Long term (current) use of anticoagulants: Secondary | ICD-10-CM | POA: Diagnosis not present

## 2023-01-16 DIAGNOSIS — I480 Paroxysmal atrial fibrillation: Secondary | ICD-10-CM | POA: Diagnosis not present

## 2023-01-16 DIAGNOSIS — D6869 Other thrombophilia: Secondary | ICD-10-CM | POA: Diagnosis not present

## 2023-02-05 DIAGNOSIS — M79644 Pain in right finger(s): Secondary | ICD-10-CM | POA: Diagnosis not present

## 2023-02-19 DIAGNOSIS — M5416 Radiculopathy, lumbar region: Secondary | ICD-10-CM | POA: Diagnosis not present

## 2023-02-20 DIAGNOSIS — D6869 Other thrombophilia: Secondary | ICD-10-CM | POA: Diagnosis not present

## 2023-02-20 DIAGNOSIS — I480 Paroxysmal atrial fibrillation: Secondary | ICD-10-CM | POA: Diagnosis not present

## 2023-02-20 DIAGNOSIS — E1129 Type 2 diabetes mellitus with other diabetic kidney complication: Secondary | ICD-10-CM | POA: Diagnosis not present

## 2023-02-20 DIAGNOSIS — Z7901 Long term (current) use of anticoagulants: Secondary | ICD-10-CM | POA: Diagnosis not present

## 2023-03-01 ENCOUNTER — Emergency Department (HOSPITAL_COMMUNITY): Payer: Medicare HMO

## 2023-03-01 ENCOUNTER — Emergency Department (HOSPITAL_COMMUNITY)
Admission: EM | Admit: 2023-03-01 | Discharge: 2023-03-01 | Disposition: A | Discharge status: Home / Self Care | Payer: Medicare HMO | Attending: Emergency Medicine | Admitting: Emergency Medicine

## 2023-03-01 ENCOUNTER — Encounter (HOSPITAL_COMMUNITY): Payer: Self-pay | Admitting: Emergency Medicine

## 2023-03-01 DIAGNOSIS — W19XXXA Unspecified fall, initial encounter: Secondary | ICD-10-CM

## 2023-03-01 DIAGNOSIS — S0083XA Contusion of other part of head, initial encounter: Secondary | ICD-10-CM | POA: Diagnosis not present

## 2023-03-01 DIAGNOSIS — M79631 Pain in right forearm: Secondary | ICD-10-CM | POA: Insufficient documentation

## 2023-03-01 DIAGNOSIS — Z043 Encounter for examination and observation following other accident: Secondary | ICD-10-CM | POA: Diagnosis not present

## 2023-03-01 DIAGNOSIS — S80212A Abrasion, left knee, initial encounter: Secondary | ICD-10-CM | POA: Insufficient documentation

## 2023-03-01 DIAGNOSIS — I4891 Unspecified atrial fibrillation: Secondary | ICD-10-CM | POA: Diagnosis not present

## 2023-03-01 DIAGNOSIS — I6782 Cerebral ischemia: Secondary | ICD-10-CM | POA: Diagnosis not present

## 2023-03-01 DIAGNOSIS — S80211A Abrasion, right knee, initial encounter: Secondary | ICD-10-CM | POA: Insufficient documentation

## 2023-03-01 DIAGNOSIS — R220 Localized swelling, mass and lump, head: Secondary | ICD-10-CM | POA: Diagnosis not present

## 2023-03-01 DIAGNOSIS — Z79899 Other long term (current) drug therapy: Secondary | ICD-10-CM | POA: Diagnosis not present

## 2023-03-01 DIAGNOSIS — Z7901 Long term (current) use of anticoagulants: Secondary | ICD-10-CM | POA: Diagnosis not present

## 2023-03-01 DIAGNOSIS — W01198A Fall on same level from slipping, tripping and stumbling with subsequent striking against other object, initial encounter: Secondary | ICD-10-CM | POA: Insufficient documentation

## 2023-03-01 DIAGNOSIS — S80219A Abrasion, unspecified knee, initial encounter: Secondary | ICD-10-CM

## 2023-03-01 DIAGNOSIS — Z7984 Long term (current) use of oral hypoglycemic drugs: Secondary | ICD-10-CM | POA: Insufficient documentation

## 2023-03-01 DIAGNOSIS — Y92480 Sidewalk as the place of occurrence of the external cause: Secondary | ICD-10-CM | POA: Insufficient documentation

## 2023-03-01 DIAGNOSIS — M25421 Effusion, right elbow: Secondary | ICD-10-CM | POA: Diagnosis not present

## 2023-03-01 DIAGNOSIS — S0990XA Unspecified injury of head, initial encounter: Secondary | ICD-10-CM | POA: Diagnosis not present

## 2023-03-01 NOTE — ED Provider Notes (Signed)
[place] EMERGENCY DEPARTMENT AT [facility] Provider Note   CSN: [id number] Arrival date & time: 03/01/23  1920     History  Chief Complaint  Patient presents with   fall on thinners    Henry Holland is a 89 y.o. male on Coumadin for A-fib presented to ED with a mechanical fall today, reports he tripped on uneven sidewalk about 3 hours ago, caught himself on his bilateral hands but also struck his left forehead on the ground.  No loss of consciousness.  He has some abrasions to the palms and knees but was able to walk on.  He is having some soreness in his right forearm as well.  No headache.  No neck pain  HPI     Home Medications Prior to Admission medications   Medication Sig Start Date End Date Taking? Authorizing Provider  ascorbic acid (VITAMIN C) 500 MG tablet Take 500 mg by mouth daily.   Yes [provider]  Cholecalciferol (VITAMIN D3) 1000 units CAPS Take 1,000 Units by mouth daily.   Yes [provider]  diltiazem (CARDIZEM CD) 120 MG 24 hr capsule Take 1 capsule (120 mg total) by mouth daily. 07/30/20  Yes [place], Peter M, MD  JANTOVEN 10 MG tablet Take 10 mg by mouth See admin instructions. Take 10 mg by mouth in the morning on Mon/Wed/Fri/Sat   Yes [provider]  JANTOVEN 2.5 MG tablet Take 2.5 mg by mouth See admin instructions. Take 2.5 mg by mouth in the morning on Sun/Tues/Thurs (to be combined with one-half of a 10 mg tablet to equal a combined total dosage of 7.5 mg)   Yes [provider]  losartan (COZAAR) 100 MG tablet Take 100 mg by mouth daily.     Yes [provider]  metFORMIN (GLUCOPHAGE-XR) 500 MG 24 hr tablet Take 500 mg by mouth 2 (two) times daily with a meal.   Yes [provider]  rosuvastatin (CRESTOR) 5 MG tablet Take 5 mg by mouth daily.     Yes [provider]  TYLENOL 8 HOUR 650 MG CR tablet Take 1,300 mg by mouth in the morning and at bedtime.   Yes [provider]  zoledronic acid (RECLAST) 5 MG/100ML SOLN injection Inject 5 mg into the vein See admin instructions. Inject 5 mg into the vein once a year 10/11/16  Yes [provider]  glucose blood (ONETOUCH VERIO) test strip Use to check blood sugar 2 times a day as directed Dx E11.4 11/13/16   [provider]  Lancets Letta Pate ULTRASOFT) lancets use for OneTouch Ultra meter to test up to bid 11/13/16   [provider]      Allergies    Atorvastatin, Ezetimibe, and Fenofibrate    Review of Systems   Review of Systems  Physical Exam Updated Vital Signs BP (!) 152/74   Pulse 80   Temp 97.7 F (36.5 C) (Oral)   Resp 17   SpO2 97%  Physical Exam Constitutional:      General: He is not in acute distress. HENT:     Head: Normocephalic.     Comments: Contusion to left forehead Eyes:     Conjunctiva/sclera: Conjunctivae normal.     Pupils: Pupils are equal, round, and reactive to light.  Neck:     Comments: No spinal midline tenderness Cardiovascular:     Rate and Rhythm: Normal rate.  Pulmonary:     Effort: Pulmonary effort is normal. No  respiratory distress.  Abdominal:     General: There is no distension.     Tenderness: There is no abdominal tenderness.  Musculoskeletal:     Comments: Full range of motion at all joints, with no visible deformities or ecchymosis. Patient does have some mild tenderness of the right forearm  Skin:    General: Skin is warm and dry.     Comments: Superficial abrasions to the bilateral knees, right lower extremity, right palm  Neurological:     General: No focal deficit present.     Mental Status: He is alert. Mental status is at baseline.  Psychiatric:        Mood and Affect: Mood normal.        Behavior: Behavior normal.     ED Results / Procedures / Treatments   Labs (all labs ordered are listed, but only abnormal results are displayed) Labs Reviewed - No data to display  EKG None  Radiology CT Head Wo  Contrast  Result Date: 03/01/2023 CLINICAL DATA:  Trauma/fall, on blood thinners EXAM: CT HEAD WITHOUT CONTRAST TECHNIQUE: Contiguous axial images were obtained from the base of the skull through the vertex without intravenous contrast. RADIATION DOSE REDUCTION: This exam was performed according to the departmental dose-optimization program which includes automated exposure control, adjustment of the mA and/or kV according to patient size and/or use of iterative reconstruction technique. COMPARISON:  08/15/2018 FINDINGS: Brain: No evidence of acute infarction, hemorrhage, hydrocephalus, extra-axial collection or mass lesion/mass effect. Age related atrophy. Subcortical white matter and periventricular small vessel ischemic changes. Vascular: Intracranial atherosclerosis. Skull: Normal. Negative for fracture or focal lesion. Sinuses/Orbits: The visualized paranasal sinuses are essentially clear. The mastoid air cells are unopacified. Nasal septum is deviated to the right. Other: Very mild soft tissue swelling overlying the left frontal bone (series 2/image 21). IMPRESSION: Very mild soft tissue swelling overlying the left frontal bone. No evidence of calvarial fracture. No acute intracranial abnormality. Atrophy with small vessel ischemic changes. Electronically Signed   By: Charline Bills M.D.   On: 03/01/2023 20:26   DG Forearm Right  Result Date: 03/01/2023 CLINICAL DATA:  Tenderness post fall EXAM: RIGHT FOREARM - 2 VIEW COMPARISON:  None Available. FINDINGS: Extensive vascular calcifications. Possible subtle nondisplaced distal radius fracture on lateral view. Positive for elbow effusion. IMPRESSION: 1. Possible subtle nondisplaced distal radius fracture. 2. Elbow effusion.  Occult elbow fracture cannot be excluded. Electronically Signed   By: Jasmine Pang M.D.   On: 03/01/2023 19:51    Procedures Procedures    Medications Ordered in ED Medications - No data to display  ED Course/ Medical  Decision Making/ A&P Clinical Course as of 03/01/23 2153  Thu Mar 01, 2023  2043 I reassessed the patient regarding his x-ray findings.  He does not have any bony tenderness at all over the radius near the point of question fracture.  Low suspicion for bony fracture.  He reports that he had a large effusion of the right elbow that was drained in the past, and I suspect this may be residual.  Again no focal bony tenderness of the elbow.  But continues to have pain with overhead arm raise in the right arm.  Question potential rotator cuff injury - unlikely complete tear.  We did discuss an arm sling, but the patient and his wife are both concerned this will significantly limit his mobility, and he is a fall risk on Coumadin.  Therefore, I think the safest bet would be to avoid  the sling at this time, and allow him to continue to use his right arm with limited activity, and advise also he follows up with his orthopedic doctor - he goes to [facility] already [[place]]    Clinical Course User Index [[place]] Malikhi Ogan, Kermit Balo, MD                             Medical Decision Making Amount and/or Complexity of Data Reviewed Radiology: ordered.   Patient is presenting with a mechanical fall.  CT scan of the head and x-ray of the forearm are ordered and personally reviewed and interpreted, showing no emergent findings on CT imaging.  X-ray with small elbow effusion and questionable distal radial fracture.  Supplemental history provided by the patient's daughter at bedside.  Patient is high risk for intracranial bleeding traumatic bleeding due to Coumadin use.  No indication for spinal imaging, pelvic imaging, or no evidence of other traumatic injuries of my exam at this time.  The patient is ambulatory.  He is stable for discharge        Final Clinical Impression(s) / ED Diagnoses Final diagnoses:  Fall, initial encounter  Contusion of forehead, initial encounter  Pain in right forearm  Abrasion of  knee, unspecified laterality, initial encounter    Rx / DC Orders ED Discharge Orders     None         Terald Sleeper, MD 03/01/23 2154

## 2023-03-01 NOTE — Discharge Instructions (Addendum)
Please call follow-up with your orthopedic surgeon or your orthopedic clinic for your right arm pain.

## 2023-03-01 NOTE — ED Triage Notes (Signed)
Pt tripped on curve. 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 Pain in left hand and forearm. Scraped knees. Ambulatory and no LOC.

## 2023-03-06 DIAGNOSIS — S52121A Displaced fracture of head of right radius, initial encounter for closed fracture: Secondary | ICD-10-CM | POA: Insufficient documentation

## 2023-03-06 DIAGNOSIS — M79631 Pain in right forearm: Secondary | ICD-10-CM | POA: Diagnosis not present

## 2023-03-06 DIAGNOSIS — M25521 Pain in right elbow: Secondary | ICD-10-CM | POA: Diagnosis not present

## 2023-03-06 DIAGNOSIS — S52124A Nondisplaced fracture of head of right radius, initial encounter for closed fracture: Secondary | ICD-10-CM | POA: Diagnosis not present

## 2023-03-08 ENCOUNTER — Telehealth: Payer: Self-pay

## 2023-03-08 DIAGNOSIS — T148XXA Other injury of unspecified body region, initial encounter: Secondary | ICD-10-CM | POA: Diagnosis not present

## 2023-03-08 DIAGNOSIS — S81811A Laceration without foreign body, right lower leg, initial encounter: Secondary | ICD-10-CM | POA: Diagnosis not present

## 2023-03-08 NOTE — Telephone Encounter (Signed)
Transition Care Management Follow-up Telephone Call Date of discharge and from where: [facility] 7/4 How have you been since you were released from the hospital? Doing good Any questions or concerns? No  Items Reviewed: Did the pt receive and understand the discharge instructions provided? Yes  Medications obtained and verified? Yes  Other? No  Any new allergies since your discharge? No  Dietary orders reviewed? No Do you have support at home? Yes     Follow up appointments reviewed:  PCP Hospital f/u appt confirmed? Yes  Scheduled to see  on  @ . Specialist Hospital f/u appt confirmed? No  Scheduled to see  on  @ . Are transportation arrangements needed? No  If their condition worsens, is the pt aware to call PCP or go to the Emergency Dept.? Yes Was the patient provided with contact information for the PCP's office or ED? Yes Was to pt encouraged to call back with questions or concerns? Yes

## 2023-03-21 DIAGNOSIS — T148XXA Other injury of unspecified body region, initial encounter: Secondary | ICD-10-CM | POA: Diagnosis not present

## 2023-03-21 DIAGNOSIS — M79631 Pain in right forearm: Secondary | ICD-10-CM | POA: Diagnosis not present

## 2023-03-21 DIAGNOSIS — S81811D Laceration without foreign body, right lower leg, subsequent encounter: Secondary | ICD-10-CM | POA: Diagnosis not present

## 2023-03-22 DIAGNOSIS — E785 Hyperlipidemia, unspecified: Secondary | ICD-10-CM | POA: Diagnosis not present

## 2023-03-22 DIAGNOSIS — Z8546 Personal history of malignant neoplasm of prostate: Secondary | ICD-10-CM | POA: Diagnosis not present

## 2023-03-22 DIAGNOSIS — M810 Age-related osteoporosis without current pathological fracture: Secondary | ICD-10-CM | POA: Diagnosis not present

## 2023-03-22 DIAGNOSIS — E291 Testicular hypofunction: Secondary | ICD-10-CM | POA: Diagnosis not present

## 2023-03-22 DIAGNOSIS — I10 Essential (primary) hypertension: Secondary | ICD-10-CM | POA: Diagnosis not present

## 2023-03-22 DIAGNOSIS — Z79899 Other long term (current) drug therapy: Secondary | ICD-10-CM | POA: Diagnosis not present

## 2023-03-22 DIAGNOSIS — E1129 Type 2 diabetes mellitus with other diabetic kidney complication: Secondary | ICD-10-CM | POA: Diagnosis not present

## 2023-03-23 DIAGNOSIS — M79631 Pain in right forearm: Secondary | ICD-10-CM | POA: Insufficient documentation

## 2023-03-28 DIAGNOSIS — M79631 Pain in right forearm: Secondary | ICD-10-CM | POA: Diagnosis not present

## 2023-03-29 DIAGNOSIS — E1129 Type 2 diabetes mellitus with other diabetic kidney complication: Secondary | ICD-10-CM | POA: Diagnosis not present

## 2023-03-29 DIAGNOSIS — Z1339 Encounter for screening examination for other mental health and behavioral disorders: Secondary | ICD-10-CM | POA: Diagnosis not present

## 2023-03-29 DIAGNOSIS — Z1331 Encounter for screening for depression: Secondary | ICD-10-CM | POA: Diagnosis not present

## 2023-03-29 DIAGNOSIS — M79601 Pain in right arm: Secondary | ICD-10-CM | POA: Diagnosis not present

## 2023-03-29 DIAGNOSIS — Z23 Encounter for immunization: Secondary | ICD-10-CM | POA: Diagnosis not present

## 2023-03-29 DIAGNOSIS — I10 Essential (primary) hypertension: Secondary | ICD-10-CM | POA: Diagnosis not present

## 2023-03-29 DIAGNOSIS — T148XXA Other injury of unspecified body region, initial encounter: Secondary | ICD-10-CM | POA: Diagnosis not present

## 2023-03-29 DIAGNOSIS — E1140 Type 2 diabetes mellitus with diabetic neuropathy, unspecified: Secondary | ICD-10-CM | POA: Diagnosis not present

## 2023-03-29 DIAGNOSIS — I480 Paroxysmal atrial fibrillation: Secondary | ICD-10-CM | POA: Diagnosis not present

## 2023-03-29 DIAGNOSIS — R82998 Other abnormal findings in urine: Secondary | ICD-10-CM | POA: Diagnosis not present

## 2023-03-29 DIAGNOSIS — I340 Nonrheumatic mitral (valve) insufficiency: Secondary | ICD-10-CM | POA: Diagnosis not present

## 2023-03-29 DIAGNOSIS — R2681 Unsteadiness on feet: Secondary | ICD-10-CM | POA: Diagnosis not present

## 2023-03-29 DIAGNOSIS — Z8546 Personal history of malignant neoplasm of prostate: Secondary | ICD-10-CM | POA: Diagnosis not present

## 2023-03-29 DIAGNOSIS — Z0000 Encounter for general adult medical examination without abnormal findings: Secondary | ICD-10-CM | POA: Diagnosis not present

## 2023-03-29 DIAGNOSIS — M810 Age-related osteoporosis without current pathological fracture: Secondary | ICD-10-CM | POA: Diagnosis not present

## 2023-03-29 DIAGNOSIS — D6869 Other thrombophilia: Secondary | ICD-10-CM | POA: Diagnosis not present

## 2023-03-29 DIAGNOSIS — M79604 Pain in right leg: Secondary | ICD-10-CM | POA: Diagnosis not present

## 2023-03-29 DIAGNOSIS — M5441 Lumbago with sciatica, right side: Secondary | ICD-10-CM | POA: Diagnosis not present

## 2023-03-29 DIAGNOSIS — D696 Thrombocytopenia, unspecified: Secondary | ICD-10-CM | POA: Diagnosis not present

## 2023-03-30 DIAGNOSIS — M79631 Pain in right forearm: Secondary | ICD-10-CM | POA: Diagnosis not present

## 2023-04-03 DIAGNOSIS — M79631 Pain in right forearm: Secondary | ICD-10-CM | POA: Diagnosis not present

## 2023-04-05 DIAGNOSIS — M79631 Pain in right forearm: Secondary | ICD-10-CM | POA: Diagnosis not present

## 2023-04-10 DIAGNOSIS — M79631 Pain in right forearm: Secondary | ICD-10-CM | POA: Diagnosis not present

## 2023-04-12 DIAGNOSIS — Z7901 Long term (current) use of anticoagulants: Secondary | ICD-10-CM | POA: Diagnosis not present

## 2023-04-12 DIAGNOSIS — S22080A Wedge compression fracture of T11-T12 vertebra, initial encounter for closed fracture: Secondary | ICD-10-CM | POA: Diagnosis not present

## 2023-04-12 DIAGNOSIS — D6869 Other thrombophilia: Secondary | ICD-10-CM | POA: Diagnosis not present

## 2023-04-12 DIAGNOSIS — I480 Paroxysmal atrial fibrillation: Secondary | ICD-10-CM | POA: Diagnosis not present

## 2023-04-13 DIAGNOSIS — M79631 Pain in right forearm: Secondary | ICD-10-CM | POA: Diagnosis not present

## 2023-04-17 DIAGNOSIS — F4542 Pain disorder with related psychological factors: Secondary | ICD-10-CM | POA: Diagnosis not present

## 2023-05-15 DIAGNOSIS — H524 Presbyopia: Secondary | ICD-10-CM | POA: Diagnosis not present

## 2023-05-22 DIAGNOSIS — Z23 Encounter for immunization: Secondary | ICD-10-CM | POA: Diagnosis not present

## 2023-05-22 DIAGNOSIS — D6869 Other thrombophilia: Secondary | ICD-10-CM | POA: Diagnosis not present

## 2023-05-22 DIAGNOSIS — E1129 Type 2 diabetes mellitus with other diabetic kidney complication: Secondary | ICD-10-CM | POA: Diagnosis not present

## 2023-05-22 DIAGNOSIS — I480 Paroxysmal atrial fibrillation: Secondary | ICD-10-CM | POA: Diagnosis not present

## 2023-05-22 DIAGNOSIS — Z7901 Long term (current) use of anticoagulants: Secondary | ICD-10-CM | POA: Diagnosis not present

## 2023-05-23 DIAGNOSIS — S22080A Wedge compression fracture of T11-T12 vertebra, initial encounter for closed fracture: Secondary | ICD-10-CM | POA: Diagnosis not present

## 2023-05-23 DIAGNOSIS — M5416 Radiculopathy, lumbar region: Secondary | ICD-10-CM | POA: Diagnosis not present

## 2023-05-29 DIAGNOSIS — M5416 Radiculopathy, lumbar region: Secondary | ICD-10-CM | POA: Diagnosis not present

## 2023-06-21 DIAGNOSIS — Z7901 Long term (current) use of anticoagulants: Secondary | ICD-10-CM | POA: Diagnosis not present

## 2023-06-21 DIAGNOSIS — D6869 Other thrombophilia: Secondary | ICD-10-CM | POA: Diagnosis not present

## 2023-06-21 DIAGNOSIS — I480 Paroxysmal atrial fibrillation: Secondary | ICD-10-CM | POA: Diagnosis not present

## 2023-06-26 DIAGNOSIS — D692 Other nonthrombocytopenic purpura: Secondary | ICD-10-CM | POA: Diagnosis not present

## 2023-06-26 DIAGNOSIS — L820 Inflamed seborrheic keratosis: Secondary | ICD-10-CM | POA: Diagnosis not present

## 2023-06-26 DIAGNOSIS — L821 Other seborrheic keratosis: Secondary | ICD-10-CM | POA: Diagnosis not present

## 2023-06-26 DIAGNOSIS — L570 Actinic keratosis: Secondary | ICD-10-CM | POA: Diagnosis not present

## 2023-06-26 DIAGNOSIS — Z85828 Personal history of other malignant neoplasm of skin: Secondary | ICD-10-CM | POA: Diagnosis not present

## 2023-06-26 DIAGNOSIS — D1801 Hemangioma of skin and subcutaneous tissue: Secondary | ICD-10-CM | POA: Diagnosis not present

## 2023-06-26 DIAGNOSIS — L812 Freckles: Secondary | ICD-10-CM | POA: Diagnosis not present

## 2023-06-26 DIAGNOSIS — D225 Melanocytic nevi of trunk: Secondary | ICD-10-CM | POA: Diagnosis not present

## 2023-06-28 DIAGNOSIS — M5416 Radiculopathy, lumbar region: Secondary | ICD-10-CM | POA: Diagnosis not present

## 2023-07-03 DIAGNOSIS — M545 Low back pain, unspecified: Secondary | ICD-10-CM | POA: Diagnosis not present

## 2023-07-03 DIAGNOSIS — M5416 Radiculopathy, lumbar region: Secondary | ICD-10-CM | POA: Diagnosis not present

## 2023-07-04 DIAGNOSIS — H35363 Drusen (degenerative) of macula, bilateral: Secondary | ICD-10-CM | POA: Diagnosis not present

## 2023-07-04 DIAGNOSIS — H04123 Dry eye syndrome of bilateral lacrimal glands: Secondary | ICD-10-CM | POA: Diagnosis not present

## 2023-07-04 DIAGNOSIS — H2513 Age-related nuclear cataract, bilateral: Secondary | ICD-10-CM | POA: Diagnosis not present

## 2023-07-10 DIAGNOSIS — M545 Low back pain, unspecified: Secondary | ICD-10-CM | POA: Diagnosis not present

## 2023-07-10 DIAGNOSIS — M5416 Radiculopathy, lumbar region: Secondary | ICD-10-CM | POA: Diagnosis not present

## 2023-07-12 DIAGNOSIS — M545 Low back pain, unspecified: Secondary | ICD-10-CM | POA: Diagnosis not present

## 2023-07-12 DIAGNOSIS — M5416 Radiculopathy, lumbar region: Secondary | ICD-10-CM | POA: Diagnosis not present

## 2023-07-17 ENCOUNTER — Ambulatory Visit (HOSPITAL_BASED_OUTPATIENT_CLINIC_OR_DEPARTMENT_OTHER): Payer: Medicare HMO

## 2023-07-17 DIAGNOSIS — I517 Cardiomegaly: Secondary | ICD-10-CM | POA: Diagnosis not present

## 2023-07-17 DIAGNOSIS — I350 Nonrheumatic aortic (valve) stenosis: Secondary | ICD-10-CM | POA: Diagnosis not present

## 2023-07-17 DIAGNOSIS — I083 Combined rheumatic disorders of mitral, aortic and tricuspid valves: Secondary | ICD-10-CM | POA: Diagnosis not present

## 2023-07-17 LAB — ECHOCARDIOGRAM COMPLETE
AR max vel: 0.71 cm2
AV Area VTI: 0.76 cm2
AV Area mean vel: 0.7 cm2
AV Mean grad: 26 mm[Hg]
AV Peak grad: 46.8 mm[Hg]
Ao pk vel: 3.42 m/s
Area-P 1/2: 4.06 cm2
MV M vel: 5.39 m/s
MV Peak grad: 116.2 mm[Hg]
Radius: 0.5 cm
S' Lateral: 3.42 cm

## 2023-07-18 DIAGNOSIS — M545 Low back pain, unspecified: Secondary | ICD-10-CM | POA: Diagnosis not present

## 2023-07-18 DIAGNOSIS — M5416 Radiculopathy, lumbar region: Secondary | ICD-10-CM | POA: Diagnosis not present

## 2023-07-19 DIAGNOSIS — C61 Malignant neoplasm of prostate: Secondary | ICD-10-CM | POA: Diagnosis not present

## 2023-07-19 DIAGNOSIS — Z7901 Long term (current) use of anticoagulants: Secondary | ICD-10-CM | POA: Diagnosis not present

## 2023-07-19 DIAGNOSIS — E291 Testicular hypofunction: Secondary | ICD-10-CM | POA: Diagnosis not present

## 2023-07-19 DIAGNOSIS — I480 Paroxysmal atrial fibrillation: Secondary | ICD-10-CM | POA: Diagnosis not present

## 2023-07-19 DIAGNOSIS — D6869 Other thrombophilia: Secondary | ICD-10-CM | POA: Diagnosis not present

## 2023-07-23 DIAGNOSIS — M545 Low back pain, unspecified: Secondary | ICD-10-CM | POA: Diagnosis not present

## 2023-07-23 DIAGNOSIS — M5416 Radiculopathy, lumbar region: Secondary | ICD-10-CM | POA: Diagnosis not present

## 2023-07-24 DIAGNOSIS — H2511 Age-related nuclear cataract, right eye: Secondary | ICD-10-CM | POA: Diagnosis not present

## 2023-07-25 DIAGNOSIS — M545 Low back pain, unspecified: Secondary | ICD-10-CM | POA: Diagnosis not present

## 2023-07-25 DIAGNOSIS — M5416 Radiculopathy, lumbar region: Secondary | ICD-10-CM | POA: Diagnosis not present

## 2023-08-01 DIAGNOSIS — M5416 Radiculopathy, lumbar region: Secondary | ICD-10-CM | POA: Diagnosis not present

## 2023-08-01 DIAGNOSIS — M545 Low back pain, unspecified: Secondary | ICD-10-CM | POA: Diagnosis not present

## 2023-08-07 DIAGNOSIS — M5416 Radiculopathy, lumbar region: Secondary | ICD-10-CM | POA: Diagnosis not present

## 2023-08-07 DIAGNOSIS — M545 Low back pain, unspecified: Secondary | ICD-10-CM | POA: Diagnosis not present

## 2023-08-09 DIAGNOSIS — D6869 Other thrombophilia: Secondary | ICD-10-CM | POA: Diagnosis not present

## 2023-08-09 DIAGNOSIS — Z7901 Long term (current) use of anticoagulants: Secondary | ICD-10-CM | POA: Diagnosis not present

## 2023-08-09 DIAGNOSIS — I480 Paroxysmal atrial fibrillation: Secondary | ICD-10-CM | POA: Diagnosis not present

## 2023-08-14 DIAGNOSIS — H2511 Age-related nuclear cataract, right eye: Secondary | ICD-10-CM | POA: Diagnosis not present

## 2023-08-20 DIAGNOSIS — H2512 Age-related nuclear cataract, left eye: Secondary | ICD-10-CM | POA: Diagnosis not present

## 2023-08-20 DIAGNOSIS — E119 Type 2 diabetes mellitus without complications: Secondary | ICD-10-CM | POA: Diagnosis not present

## 2023-08-20 NOTE — Progress Notes (Signed)
Cardiology Office Note:  .   Date:  08/21/2023  ID:  Henry Holland, DOB [birth year], MRN [id number] PCP: Cleatis Polka., MD  [facility] Providers Cardiologist:  Jodelle Red, MD {  History of Present Illness: .   Henry Holland is a 89 y.o. male with a hx of permanent atrial fibrillation on coumadin, hypertension, aortic stenosis who is seen for follow up today.    CV history: Atrial fibrillation: permanent, since 2002. Tolerating coumadin well, no significant bruising or bleeding. Aortic stenosis, most recent echo 07/17/23 with severe low flow low gradient AS with mean gradient 25, AVA 0.7, DI 0.24. LV SV 48. Mild-moderate MR. Severe biatrial enlargement.  Today: Reviewed his echo results from last month. Feels good most of the time but has no energy. Has indigestion at night when he lays down, if he takes a tums it gets better. Discussed options for managing this.  Going to PT for his balance, has chronic sciatic pain in his R leg. Mows his yard, does weed eating without symptoms. Works out 3-4 times/week at [employer], rides the bike  ROS: Denies chest pain, shortness of breath at rest or with normal exertion. No PND, orthopnea, LE edema or unexpected weight gain. No syncope or palpitations. ROS otherwise negative except as noted.   Studies Reviewed: Marland Kitchen    EKG:  EKG Interpretation Date/Time:  Tuesday August 21 2023 08:30:04 EST Ventricular Rate:  81 PR Interval:    QRS Duration:  112 QT Interval:  408 QTC Calculation: 473 R Axis:   76  Text Interpretation: Atrial fibrillation Incomplete right bundle branch block Marked ST abnormality, possible inferior subendocardial injury Confirmed by Jodelle Red [id number]) on 08/21/2023 8:59:10 AM    Physical Exam:   VS:  BP 128/82   Pulse 88   Ht 6' (1.829 m)   Wt 172 lb 4.8 oz (78.2 kg)   SpO2 98%   BMI 23.37 kg/m    Wt Readings from Last 3 Encounters:  08/21/23 172 lb 4.8 oz (78.2 kg)  07/18/22 172 lb  9.6 oz (78.3 kg)  08/01/21 177 lb 1.6 oz (80.3 kg)    GEN: Well nourished, well developed in no acute distress HEENT: Normal, moist mucous membranes NECK: No JVD CARDIAC: irregular rhythm, normal S1 and S2, no rubs or gallops. 2-3/6 harsh SM and 2/6 holosystolic murmur  VASCULAR: Radial and DP pulses 2+ bilaterally. No carotid bruits RESPIRATORY:  Clear to auscultation without rales, wheezing or rhonchi  ABDOMEN: Soft, non-tender, non-distended MUSCULOSKELETAL:  Ambulates independently SKIN: Warm and dry, no edema NEUROLOGIC:  Alert and oriented x 3. No focal neuro deficits noted. PSYCHIATRIC:  Normal affect    ASSESSMENT AND PLAN: .    Severe aortic stenosis (low-flow low-gradient) Mild-moderate mitral regurgitation -echo reviewed today. -we discussed aortic stenosis at length, symptoms to watch for, typical procedure and workup -he has not had exertional symptoms but has mild chronic fatigue -we discussed referral to structural heart team. They will consider. Reviewed red flags that need immediate medical attention   Permanent atrial fibrillation: rate controlled on diltiazem, long term anticoagulation with coumadin -CHA2DS2/VAS Stroke Risk Points=4. We have discussed DOAC but reasonable to continue coumadin per patient preference  CV risk counseling and prevention -recommend heart healthy/Mediterranean diet, with whole grains, fruits, vegetable, fish, lean meats, nuts, and olive oil. Limit salt. -recommend moderate walking, 3-5 times/week for 30-50 minutes each session. Aim for at least 150 minutes.week. Goal should be pace of 3 miles/hours,  or walking 1.5 miles in 30 minutes -recommend avoidance of tobacco products. Avoid excess alcohol.  Dispo: 6 months for symptoms  Signed, Jodelle Red, MD   Jodelle Red, MD, PhD, [facility]  [facility]  [place]  Heart & Vascular at [facility] at [facility] [address],  [address] [place], [place] [address] [phone]

## 2023-08-21 ENCOUNTER — Ambulatory Visit (HOSPITAL_BASED_OUTPATIENT_CLINIC_OR_DEPARTMENT_OTHER): Payer: Medicare HMO | Admitting: Cardiology

## 2023-08-21 ENCOUNTER — Encounter (HOSPITAL_BASED_OUTPATIENT_CLINIC_OR_DEPARTMENT_OTHER): Payer: Self-pay | Admitting: Cardiology

## 2023-08-21 VITALS — BP 128/82 | HR 88 | Ht 72.0 in | Wt 172.3 lb

## 2023-08-21 DIAGNOSIS — I4821 Permanent atrial fibrillation: Secondary | ICD-10-CM

## 2023-08-21 DIAGNOSIS — I340 Nonrheumatic mitral (valve) insufficiency: Secondary | ICD-10-CM

## 2023-08-21 DIAGNOSIS — I350 Nonrheumatic aortic (valve) stenosis: Secondary | ICD-10-CM

## 2023-08-21 DIAGNOSIS — D6859 Other primary thrombophilia: Secondary | ICD-10-CM

## 2023-08-21 DIAGNOSIS — Z7901 Long term (current) use of anticoagulants: Secondary | ICD-10-CM | POA: Diagnosis not present

## 2023-08-21 NOTE — Patient Instructions (Addendum)
Medication Instructions:  Your physician recommends that you continue on your current medications as directed. Please refer to the Current Medication list given to you today.  *If you need a refill on your cardiac medications before your next appointment, please call your pharmacy*  Lab Work: NONE  Testing/Procedures: NONE  Follow-Up: At [facility], you and your health needs are our priority.  As part of our continuing mission to provide you with exceptional heart care, we have created designated Provider Care Teams.  These Care Teams include your primary Cardiologist (physician) and Advanced Practice Providers (APPs -  Physician Assistants and Nurse Practitioners) who all work together to provide you with the care you need, when you need it.  We recommend signing up for the patient portal called "MyChart".  Sign up information is provided on this After Visit Summary.  MyChart is used to connect with patients for Virtual Visits (Telemedicine).  Patients are able to view lab/test results, encounter notes, upcoming appointments, etc.  Non-urgent messages can be sent to your provider as well.   To learn more about what you can do with MyChart, go to [email or web address].    Your next appointment:   6 month(s)  The format for your next appointment:   In Person  Provider:   Jodelle Red, MD     You have been referred to STRUCTURAL HEART TEAM

## 2023-08-27 ENCOUNTER — Other Ambulatory Visit (HOSPITAL_BASED_OUTPATIENT_CLINIC_OR_DEPARTMENT_OTHER): Payer: Self-pay

## 2023-08-27 MED ORDER — COVID-19 MRNA VAC-TRIS(PFIZER) 30 MCG/0.3ML IM SUSY
0.3000 mL | PREFILLED_SYRINGE | Freq: Once | INTRAMUSCULAR | 0 refills | Status: AC
  Filled 2023-08-27: qty 0.3, 1d supply, fill #0

## 2023-09-03 ENCOUNTER — Other Ambulatory Visit (HOSPITAL_BASED_OUTPATIENT_CLINIC_OR_DEPARTMENT_OTHER): Payer: Self-pay

## 2023-09-03 DIAGNOSIS — Z7901 Long term (current) use of anticoagulants: Secondary | ICD-10-CM | POA: Diagnosis not present

## 2023-09-03 DIAGNOSIS — I480 Paroxysmal atrial fibrillation: Secondary | ICD-10-CM | POA: Diagnosis not present

## 2023-09-04 DIAGNOSIS — H2512 Age-related nuclear cataract, left eye: Secondary | ICD-10-CM | POA: Diagnosis not present

## 2023-09-11 ENCOUNTER — Telehealth: Payer: Self-pay | Admitting: Cardiology

## 2023-09-11 ENCOUNTER — Encounter (HOSPITAL_BASED_OUTPATIENT_CLINIC_OR_DEPARTMENT_OTHER): Payer: Self-pay

## 2023-09-11 DIAGNOSIS — I340 Nonrheumatic mitral (valve) insufficiency: Secondary | ICD-10-CM

## 2023-09-11 DIAGNOSIS — I350 Nonrheumatic aortic (valve) stenosis: Secondary | ICD-10-CM

## 2023-09-11 NOTE — Telephone Encounter (Signed)
 Patient states he was previously told he would be contacted to schedule a procedure, but he hasn't heard from anyone. Please advise.

## 2023-09-11 NOTE — Telephone Encounter (Signed)
 Spoke with pt, he is asking about the appointment to see the folks regarding his aortic valve. According to the last office note, he was referred to the structural heart team. Aware will check on the appointment and have someone call him back. Referral placed and message sent to structural team.

## 2023-09-12 ENCOUNTER — Encounter: Payer: Self-pay | Admitting: Cardiovascular Disease

## 2023-09-12 ENCOUNTER — Ambulatory Visit: Payer: Medicare HMO | Attending: Cardiovascular Disease | Admitting: Cardiovascular Disease

## 2023-09-12 VITALS — BP 106/64 | HR 68 | Ht 72.0 in | Wt 173.6 lb

## 2023-09-12 DIAGNOSIS — I350 Nonrheumatic aortic (valve) stenosis: Secondary | ICD-10-CM

## 2023-09-12 DIAGNOSIS — Z01812 Encounter for preprocedural laboratory examination: Secondary | ICD-10-CM | POA: Diagnosis not present

## 2023-09-12 NOTE — Progress Notes (Addendum)
Pre Surgical Assessment: 5 M Walk Test  41M=16.55ft  5 Meter Walk Test- trial 1: 6.14 seconds 5 Meter Walk Test- trial 2: 6.62 seconds 5 Meter Walk Test- trial 3: 5.38 seconds 5 Meter Walk Test Average: 6.04 seconds  ___________________________  Procedure Type: Isolated AVR Perioperative Outcome Estimate % Operative Mortality 3.52% Morbidity & Mortality 8.74% Stroke 1.16% Renal Failure 1.38% Reoperation 4.36% Prolonged Ventilation 3.79% Deep Sternal Wound Infection 0.031% Long Hospital Stay (>14 days) 5.01% Short Hospital Stay (<6 days)* 32%

## 2023-09-12 NOTE — H&P (View-Only) (Signed)
Structural Heart Clinic Consult Note  Chief Complaint  Patient presents with   New Patient (Initial Visit)    Severe aortic stenosis   History of Present Illness: 89 yo male with history of persistent atrial fibrillation, HTN, HLD, prostate cancer and severe aortic stenosis who is here today as a new consult, referred by Dr. Cristal Deer, for further discussion regarding his aortic stenosis and possible TAVR. He has persistent atrial fibrillation and is on coumadin. He had prostate cancer in 2012 treated with radiation therapy. He has been followed for moderate aortic stenosis. Echo 07/17/23 with LVEF=60-65%. Mild to moderate MR. Severe paradoxical low flow/low gradient aortic stenosis with mean gradient 26 mmHg, AVA 0.70 cm2, DI 0.24, SVI 24.   He tells me today that he has had progressive fatigue and dyspnea on exertion. No chest pain, dizziness or LE edema. He lives in [place], [place] with his wife. He is retired from [employer]. He has partial dentures and no active dental issues.   Primary Care Physician: Cleatis Polka., MD Primary Cardiologist: Cristal Deer Referring Cardiologist: Cristal Deer  Past Medical History:  Diagnosis Date   Arthritis    Chronic atrial fibrillation [facility])    Dysrhythmia    History of radiation therapy    Hyperlipidemia    Hypertension    Prostate cancer [facility])     Past Surgical History:  Procedure Laterality Date   CATARACT EXTRACTION, BILATERAL     HERNIA REPAIR     X2    Current Outpatient Medications  Medication Sig Dispense Refill   ascorbic acid (VITAMIN C) 500 MG tablet Take 500 mg by mouth daily.     Cholecalciferol (VITAMIN D3) 1000 units CAPS Take 1,000 Units by mouth daily.     diltiazem (CARDIZEM CD) 120 MG 24 hr capsule Take 1 capsule (120 mg total) by mouth daily. 90 capsule 3   glucose blood (ONETOUCH VERIO) test strip Use to check blood sugar 2 times a day as directed Dx E11.4     JANTOVEN 10 MG tablet Take 10 mg by mouth See  admin instructions. Take 10 mg by mouth in the morning on Mon/Wed/Fri/Sat     JANTOVEN 2.5 MG tablet Take 2.5 mg by mouth See admin instructions. Take 2.5 mg by mouth in the morning on Sun/Tues/Thurs (to be combined with one-half of a 10 mg tablet to equal a combined total dosage of 7.5 mg)     Lancets (ONETOUCH ULTRASOFT) lancets use for OneTouch Ultra meter to test up to bid     losartan (COZAAR) 100 MG tablet Take 100 mg by mouth daily.       metFORMIN (GLUCOPHAGE-XR) 500 MG 24 hr tablet Take 500 mg by mouth 2 (two) times daily with a meal.     rosuvastatin (CRESTOR) 5 MG tablet Take 5 mg by mouth daily.       zoledronic acid (RECLAST) 5 MG/100ML SOLN injection Inject 5 mg into the vein See admin instructions. Inject 5 mg into the vein once a year     No current facility-administered medications for this visit.    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: [employer]   Occupation: Retired from [employer]  Tobacco Use   Smoking status: Never   Smokeless tobacco: Never  Vaping Use   Vaping status: Never Used  Substance and Sexual Activity   Alcohol use: No   Drug use: No   Sexual activity: Not on file  Other Topics Concern   Not on file  Social History Narrative   Not on file   Social Drivers of Health   Financial Resource Strain: Not on file  Food Insecurity: Not on file  Transportation Needs: Not on file  Physical Activity: Not on file  Stress: Not on file  Social Connections: Not on file  Intimate Partner Violence: Not on file     Family History  Problem Relation Age of Onset   Stroke Mother 70    Review of Systems:  As stated in the HPI and otherwise negative.   BP 106/64   Pulse 68   Ht 6' (1.829 m)   Wt 78.7 kg   SpO2 98%   BMI 23.54 kg/m   Physical Examination: General: Well developed, well nourished, NAD  HEENT: OP clear, mucus membranes moist  SKIN: warm, dry. No rashes. Neuro: No focal deficits  Musculoskeletal: Muscle strength 5/5 all ext  Psychiatric: Mood and affect normal  Neck: No JVD, no carotid bruits, no thyromegaly, no lymphadenopathy.  Lungs:Clear bilaterally, no wheezes, rhonci, crackles Cardiovascular: Regular rate and rhythm. Loud, harsh, late peaking systolic murmur.  Abdomen:Soft. Bowel sounds present. Non-tender.  Extremities: No lower extremity edema. Pulses are 2 + in the bilateral DP/PT.  EKG:  EKG is not ordered today. The ekg ordered today demonstrates EKG Interpretation Date/Time:  Wednesday September 12 2023 12:11:27 EST Ventricular Rate:  76 PR Interval:    QRS Duration:  120 QT Interval:  408 QTC Calculation: 459 R Axis:   76  Text Interpretation: Atrial fibrillation RSR' or QR pattern in V1 suggests right ventricular conduction delay Minimal voltage criteria for LVH, may be normal variant ( Sokolow-Lyon ) ST & T wave abnormality, consider inferior ischemia Confirmed by Verne Carrow [phone]) on 09/12/2023 1:30:37 PM    Echo 07/17/23:  1. Calcified aortic valve with severe low flow low gradient AS (mean  gradient 26 mmHg; AVA 0.7 cm2 and DI 0.24).   2. Left ventricular ejection fraction, by estimation, is 60 to 65%. The  left ventricle has normal function. The left ventricle has no regional  wall motion abnormalities. There is mild left ventricular hypertrophy.  Left ventricular diastolic parameters  are indeterminate. Elevated left atrial pressure.   3. Right ventricular systolic function is normal. The right ventricular  size is mildly enlarged.  There is moderately elevated pulmonary artery  systolic pressure.   4. Left atrial size was severely dilated.   5. Right atrial size was severely dilated.   6. The mitral valve is normal in structure. Mild to moderate mitral valve  regurgitation. No evidence of mitral stenosis. Moderate mitral annular  calcification.   7. The aortic valve is tricuspid. Aortic valve regurgitation is not  visualized. Severe aortic valve stenosis.   8. The inferior vena cava is dilated in size with <50% respiratory  variability, suggesting right atrial pressure of 15 mmHg.   FINDINGS   Left Ventricle: Left ventricular ejection fraction, by estimation, is 60  to 65%. The left ventricle has normal function. The left ventricle has no  regional wall motion abnormalities. The left ventricular internal cavity  size was normal in size. There is   mild left ventricular hypertrophy. Left ventricular diastolic parameters  are indeterminate. Elevated left atrial pressure.   Right Ventricle: The right ventricular size is mildly enlarged. No  increase in right ventricular wall thickness. Right ventricular systolic  function is normal. There is moderately elevated pulmonary artery systolic  pressure. The tricuspid regurgitant  velocity is 3.06 m/s, and with an assumed right atrial pressure of 15  mmHg, the estimated right ventricular systolic pressure is 52.5 mmHg.   Left Atrium: Left atrial size was severely dilated.   Right Atrium: Right atrial size was severely dilated.   Pericardium: There is no evidence of pericardial effusion.   Mitral Valve: The mitral valve is normal in structure. Moderate mitral  annular calcification. Mild to moderate mitral valve regurgitation. No  evidence of mitral valve stenosis.   Tricuspid Valve: The tricuspid valve is normal in structure. Tricuspid  valve regurgitation is mild . No evidence of tricuspid stenosis.   Aortic Valve: The aortic valve is tricuspid. Aortic valve  regurgitation is  not visualized. Severe aortic stenosis is present. Aortic valve mean  gradient measures 26.0 mmHg. Aortic valve peak gradient measures 46.8  mmHg. Aortic valve area, by VTI measures   0.76 cm.   Pulmonic Valve: The pulmonic valve was normal in structure. Pulmonic valve  regurgitation is trivial. No evidence of pulmonic stenosis.   Aorta: The aortic root is normal in size and structure.   Venous: The inferior vena cava is dilated in size with less than 50%  respiratory variability, suggesting right atrial pressure of 15 mmHg.   IAS/Shunts: No atrial level shunt detected by color flow Doppler.   Additional Comments: Calcified aortic valve with severe low flow low  gradient AS (mean gradient 26 mmHg; AVA 0.7 cm2 and DI 0.24).     LEFT VENTRICLE  PLAX 2D  LVIDd:         4.91 cm   Diastology  LVIDs:         3.42 cm   LV e' medial:    4.43 cm/s  LV PW:         1.17 cm   LV E/e' medial:  40.0  LV IVS:        1.32 cm   LV e' lateral:   8.33 cm/s  LVOT diam:     2.00 cm   LV E/e' lateral: 21.2  LV SV:         48  LV SV Index:   24  LVOT Area:     3.14 cm                             3D Volume EF:                           3D EF:        53 %                           LV EDV:       139 ml                           LV ESV:  65 ml                           LV SV:        74 ml   RIGHT VENTRICLE  RV Basal diam:  4.42 cm  RV Mid diam:    3.03 cm  RV S prime:     12.30 cm/s  TAPSE (M-mode): 2.0 cm   LEFT ATRIUM              Index        RIGHT ATRIUM           Index  LA diam:        5.50 cm  2.72 cm/m   RA Area:     33.60 cm  LA Vol (A2C):   150.0 ml 74.22 ml/m  RA Volume:   136.00 ml 67.29 ml/m  LA Vol (A4C):   178.0 ml 88.07 ml/m  LA Biplane Vol: 165.0 ml 81.64 ml/m   AORTIC VALVE  AV Area (Vmax):    0.71 cm  AV Area (Vmean):   0.70 cm  AV Area (VTI):     0.76 cm  AV Vmax:           342.00 cm/s  AV Vmean:          233.000 cm/s  AV VTI:             0.629 m  AV Peak Grad:      46.8 mmHg  AV Mean Grad:      26.0 mmHg  LVOT Vmax:         76.80 cm/s  LVOT Vmean:        51.800 cm/s  LVOT VTI:          0.152 m  LVOT/AV VTI ratio: 0.24    AORTA  Ao Root diam: 3.40 cm  Ao Asc diam:  3.30 cm   MITRAL VALVE                 TRICUSPID VALVE  MV Area (PHT): 4.06 cm      TR Peak grad:   37.5 mmHg  MV Decel Time: 187 msec      TR Vmax:        306.00 cm/s  MR Peak grad:   116.2 mmHg  MR Mean grad:   83.0 mmHg    SHUNTS  MR Vmax:        539.00 cm/s  Systemic VTI:  0.15 m  MR Vmean:       438.0 cm/s   Systemic Diam: 2.00 cm  MR PISA:        1.57 cm  MR PISA Radius: 0.50 cm  MV E velocity: 177.00 cm/s   Recent Labs: No results found for requested labs within last 365 days.   Lipid Panel    Component Value Date/Time   CHOL 125 01/02/2011 0946   TRIG 121.0 01/02/2011 0946   HDL 37.40 (L) 01/02/2011 0946   CHOLHDL 3 01/02/2011 0946   VLDL 24.2 01/02/2011 0946   LDLCALC 63 01/02/2011 0946     Wt Readings from Last 3 Encounters:  09/12/23 78.7 kg  08/21/23 78.2 kg  07/18/22 78.3 kg    Assessment and Plan:   1. Severe Aortic Valve Stenosis: He has severe paradoxical low flow/low gradient aortic stenosis. He has NYHA class 2 symptoms. I have personally reviewed the echo images. The aortic valve is thickened and calcified  with limited leaflet mobility. I think he would benefit from AVR. Given advanced age, he is not a good candidate for conventional AVR by surgical approach. I think he may be a good candidate for TAVR.   I have reviewed the natural history of aortic stenosis with the patient and their family members  who are present today. We have discussed the limitations of medical therapy and the poor prognosis associated with symptomatic aortic stenosis. We have reviewed potential treatment options, including palliative medical therapy, conventional surgical aortic valve replacement, and transcatheter aortic valve replacement. We  discussed treatment options in the context of the patient's specific comorbid medical conditions.   He would like to proceed with planning for TAVR. I will arrange a right and left heart catheterization at [facility] 09/19/23. Risks and benefits of the cath procedure and the valve procedure are reviewed with the patient. After the cath, he will have a cardiac CT, CTA of the chest/abdomen and pelvis and will then be referred to see one of the CT surgeons on our TAVR team.     Labs/ tests ordered today include:   Orders Placed This Encounter  Procedures   Basic metabolic panel   CBC   EKG 12-Lead   Disposition:   F/U will be arranged with the structural team  Signed, Verne Carrow, MD, [facility] 09/12/2023 1:28 PM    [facility] [address] [place], [place], [place]  [address] Phone: [phone]; Fax: [phone]

## 2023-09-12 NOTE — Patient Instructions (Addendum)
 Medication Instructions:  No changes *If you need a refill on your cardiac medications before your next appointment, please call your pharmacy*   Lab Work: Go to the first floor [facility] for blood work today: cbc, bmet   Testing/Procedures: Your physician has requested that you have a cardiac catheterization. Cardiac catheterization is used to diagnose and/or treat various heart conditions. Doctors may recommend this procedure for a number of different reasons. The most common reason is to evaluate chest pain. Chest pain can be a symptom of coronary artery disease (CAD), and cardiac catheterization can show whether plaque is narrowing or blocking your heart's arteries. This procedure is also used to evaluate the valves, as well as measure the blood flow and oxygen levels in different parts of your heart. For further information please visit [email or web address] Please follow instruction sheet, as given.   Follow-Up: Per Structural Heart Team  Other Instructions       Cardiac/Peripheral Catheterization   You are scheduled for a Cardiac Catheterization on Wednesday, January 22 with Dr. Antoinette Batman.  1. Please arrive at the [facility] (Main Entrance A) at [facility]: [address] [place], [place] [address] at 6:30 AM (This time is TWO hour(s) before your procedure to ensure your preparation).   Free valet parking service is available. You will check in at ADMITTING. The support person will be asked to wait in the waiting room.  It is OK to have someone drop you off and come back when you are ready to be discharged.        Special note: Every effort is made to have your procedure done on time. Please understand that emergencies sometimes delay scheduled procedures.  2. Diet: Do not eat solid foods after midnight.  You may have clear liquids until 5 AM the day of the procedure.  3. Labs: You will need to have blood drawn today.  You do not need to be fasting.  4.  Medication instructions in preparation for your procedure:   Contrast Allergy: No   No Jantoven for 5 days before cath day--do not take any after Jan 16 until after cath is complete.  You will be instructed on restarting.  Do not take Diabetes Med Glucophage (Metformin) on the day of the procedure and HOLD 48 HOURS AFTER THE PROCEDURE.  On the morning of your procedure, take Aspirin  81 mg and any morning medicines NOT listed above.  You may use sips of water.  5. Plan to go home the same day, you will only stay overnight if medically necessary. 6. You MUST have a responsible adult to drive you home. 7. An adult MUST be with you the first 24 hours after you arrive home. 8. Bring a current list of your medications, and the last time and date medication taken. 9. Bring ID and current insurance cards. 10.Please wear clothes that are easy to get on and off and wear slip-on shoes.  Thank you for allowing us  to care for you!   -- [facility]        Called Dr. Marcheta Seta office and left VM for Ivette Marks [facility]) leaving detailed information that patient is scheduled for heart catheterization on 09/19/23 and has been instructed to hold Jantoven for 5 days prior, with last dose being on Jan 16.  Asked that she call back or contact the patient to arrange anything they will need to do for him since INR is checked there.

## 2023-09-12 NOTE — Progress Notes (Addendum)
 [facility] Consult Note  Chief Complaint  Patient presents with   New Patient (Initial Visit)    Severe aortic stenosis   History of Present Illness: 89 yo male with history of persistent atrial fibrillation, HTN, HLD, prostate cancer and severe aortic stenosis who is here today as a new consult, referred by Dr. Cristal Deer, for further discussion regarding his aortic stenosis and possible TAVR. He has persistent atrial fibrillation and is on coumadin. He had prostate cancer in 2012 treated with radiation therapy. He has been followed for moderate aortic stenosis. Echo 07/17/23 with LVEF=60-65%. Mild to moderate MR. Severe paradoxical low flow/low gradient aortic stenosis with mean gradient 26 mmHg, AVA 0.70 cm2, DI 0.24, SVI 24.   He tells me today that he has had progressive fatigue and dyspnea on exertion. No chest pain, dizziness or LE edema. He lives in [place], [place] with his wife. He is retired from [employer]. He has partial dentures and no active dental issues.   Primary Care Physician: Cleatis Polka., MD Primary Cardiologist: Cristal Deer Referring Cardiologist: Cristal Deer  Past Medical History:  Diagnosis Date   Arthritis    Chronic atrial fibrillation [facility])    Dysrhythmia    History of radiation therapy    Hyperlipidemia    Hypertension    Prostate cancer [facility])     Past Surgical History:  Procedure Laterality Date   CATARACT EXTRACTION, BILATERAL     HERNIA REPAIR     X2    Current Outpatient Medications  Medication Sig Dispense Refill   ascorbic acid (VITAMIN C) 500 MG tablet Take 500 mg by mouth daily.     Cholecalciferol (VITAMIN D3) 1000 units CAPS Take 1,000 Units by mouth daily.     diltiazem (CARDIZEM CD) 120 MG 24 hr capsule Take 1 capsule (120 mg total) by mouth daily. 90 capsule 3   glucose blood (ONETOUCH VERIO) test strip Use to check blood sugar 2 times a day as directed Dx E11.4     JANTOVEN 10 MG tablet Take 10 mg by mouth See  admin instructions. Take 10 mg by mouth in the morning on Mon/Wed/Fri/Sat     JANTOVEN 2.5 MG tablet Take 2.5 mg by mouth See admin instructions. Take 2.5 mg by mouth in the morning on Sun/Tues/Thurs (to be combined with one-half of a 10 mg tablet to equal a combined total dosage of 7.5 mg)     Lancets (ONETOUCH ULTRASOFT) lancets use for OneTouch Ultra meter to test up to bid     losartan (COZAAR) 100 MG tablet Take 100 mg by mouth daily.       metFORMIN (GLUCOPHAGE-XR) 500 MG 24 hr tablet Take 500 mg by mouth 2 (two) times daily with a meal.     rosuvastatin (CRESTOR) 5 MG tablet Take 5 mg by mouth daily.       zoledronic acid (RECLAST) 5 MG/100ML SOLN injection Inject 5 mg into the vein See admin instructions. Inject 5 mg into the vein once a year     No current facility-administered medications for this visit.    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: [employer]   Occupation: Retired from [employer]  Tobacco Use   Smoking status: Never   Smokeless tobacco: Never  Vaping Use   Vaping status: Never Used  Substance and Sexual Activity   Alcohol use: No   Drug use: No   Sexual activity: Not on file  Other Topics Concern   Not on file  Social History Narrative   Not on file   Social Drivers of Health   Financial Resource Strain: Not on file  Food Insecurity: Not on file  Transportation Needs: Not on file  Physical Activity: Not on file  Stress: Not on file  Social Connections: Not on file  Intimate Partner Violence: Not on file     Family History  Problem Relation Age of Onset   Stroke Mother 70    Review of Systems:  As stated in the HPI and otherwise negative.   BP 106/64   Pulse 68   Ht 6' (1.829 m)   Wt 78.7 kg   SpO2 98%   BMI 23.54 kg/m   Physical Examination: General: Well developed, well nourished, NAD  HEENT: OP clear, mucus membranes moist  SKIN: warm, dry. No rashes. Neuro: No focal deficits  Musculoskeletal: Muscle strength 5/5 all ext  Psychiatric: Mood and affect normal  Neck: No JVD, no carotid bruits, no thyromegaly, no lymphadenopathy.  Lungs:Clear bilaterally, no wheezes, rhonci, crackles Cardiovascular: Regular rate and rhythm. Loud, harsh, late peaking systolic murmur.  Abdomen:Soft. Bowel sounds present. Non-tender.  Extremities: No lower extremity edema. Pulses are 2 + in the bilateral DP/PT.  EKG:  EKG is not ordered today. The ekg ordered today demonstrates EKG Interpretation Date/Time:  Wednesday September 12 2023 12:11:27 EST Ventricular Rate:  76 PR Interval:    QRS Duration:  120 QT Interval:  408 QTC Calculation: 459 R Axis:   76  Text Interpretation: Atrial fibrillation RSR' or QR pattern in V1 suggests right ventricular conduction delay Minimal voltage criteria for LVH, may be normal variant ( Sokolow-Lyon ) ST & T wave abnormality, consider inferior ischemia Confirmed by Verne Carrow [phone]) on 09/12/2023 1:30:37 PM    Echo 07/17/23:  1. Calcified aortic valve with severe low flow low gradient AS (mean  gradient 26 mmHg; AVA 0.7 cm2 and DI 0.24).   2. Left ventricular ejection fraction, by estimation, is 60 to 65%. The  left ventricle has normal function. The left ventricle has no regional  wall motion abnormalities. There is mild left ventricular hypertrophy.  Left ventricular diastolic parameters  are indeterminate. Elevated left atrial pressure.   3. Right ventricular systolic function is normal. The right ventricular  size is mildly enlarged.  There is moderately elevated pulmonary artery  systolic pressure.   4. Left atrial size was severely dilated.   5. Right atrial size was severely dilated.   6. The mitral valve is normal in structure. Mild to moderate mitral valve  regurgitation. No evidence of mitral stenosis. Moderate mitral annular  calcification.   7. The aortic valve is tricuspid. Aortic valve regurgitation is not  visualized. Severe aortic valve stenosis.   8. The inferior vena cava is dilated in size with <50% respiratory  variability, suggesting right atrial pressure of 15 mmHg.   FINDINGS   Left Ventricle: Left ventricular ejection fraction, by estimation, is 60  to 65%. The left ventricle has normal function. The left ventricle has no  regional wall motion abnormalities. The left ventricular internal cavity  size was normal in size. There is   mild left ventricular hypertrophy. Left ventricular diastolic parameters  are indeterminate. Elevated left atrial pressure.   Right Ventricle: The right ventricular size is mildly enlarged. No  increase in right ventricular wall thickness. Right ventricular systolic  function is normal. There is moderately elevated pulmonary artery systolic  pressure. The tricuspid regurgitant  velocity is 3.06 m/s, and with an assumed right atrial pressure of 15  mmHg, the estimated right ventricular systolic pressure is 52.5 mmHg.   Left Atrium: Left atrial size was severely dilated.   Right Atrium: Right atrial size was severely dilated.   Pericardium: There is no evidence of pericardial effusion.   Mitral Valve: The mitral valve is normal in structure. Moderate mitral  annular calcification. Mild to moderate mitral valve regurgitation. No  evidence of mitral valve stenosis.   Tricuspid Valve: The tricuspid valve is normal in structure. Tricuspid  valve regurgitation is mild . No evidence of tricuspid stenosis.   Aortic Valve: The aortic valve is tricuspid. Aortic valve  regurgitation is  not visualized. Severe aortic stenosis is present. Aortic valve mean  gradient measures 26.0 mmHg. Aortic valve peak gradient measures 46.8  mmHg. Aortic valve area, by VTI measures   0.76 cm.   Pulmonic Valve: The pulmonic valve was normal in structure. Pulmonic valve  regurgitation is trivial. No evidence of pulmonic stenosis.   Aorta: The aortic root is normal in size and structure.   Venous: The inferior vena cava is dilated in size with less than 50%  respiratory variability, suggesting right atrial pressure of 15 mmHg.   IAS/Shunts: No atrial level shunt detected by color flow Doppler.   Additional Comments: Calcified aortic valve with severe low flow low  gradient AS (mean gradient 26 mmHg; AVA 0.7 cm2 and DI 0.24).     LEFT VENTRICLE  PLAX 2D  LVIDd:         4.91 cm   Diastology  LVIDs:         3.42 cm   LV e' medial:    4.43 cm/s  LV PW:         1.17 cm   LV E/e' medial:  40.0  LV IVS:        1.32 cm   LV e' lateral:   8.33 cm/s  LVOT diam:     2.00 cm   LV E/e' lateral: 21.2  LV SV:         48  LV SV Index:   24  LVOT Area:     3.14 cm                             3D Volume EF:                           3D EF:        53 %                           LV EDV:       139 ml                           LV ESV:  65 ml                           LV SV:        74 ml   RIGHT VENTRICLE  RV Basal diam:  4.42 cm  RV Mid diam:    3.03 cm  RV S prime:     12.30 cm/s  TAPSE (M-mode): 2.0 cm   LEFT ATRIUM              Index        RIGHT ATRIUM           Index  LA diam:        5.50 cm  2.72 cm/m   RA Area:     33.60 cm  LA Vol (A2C):   150.0 ml 74.22 ml/m  RA Volume:   136.00 ml 67.29 ml/m  LA Vol (A4C):   178.0 ml 88.07 ml/m  LA Biplane Vol: 165.0 ml 81.64 ml/m   AORTIC VALVE  AV Area (Vmax):    0.71 cm  AV Area (Vmean):   0.70 cm  AV Area (VTI):     0.76 cm  AV Vmax:           342.00 cm/s  AV Vmean:          233.000 cm/s  AV VTI:             0.629 m  AV Peak Grad:      46.8 mmHg  AV Mean Grad:      26.0 mmHg  LVOT Vmax:         76.80 cm/s  LVOT Vmean:        51.800 cm/s  LVOT VTI:          0.152 m  LVOT/AV VTI ratio: 0.24    AORTA  Ao Root diam: 3.40 cm  Ao Asc diam:  3.30 cm   MITRAL VALVE                 TRICUSPID VALVE  MV Area (PHT): 4.06 cm      TR Peak grad:   37.5 mmHg  MV Decel Time: 187 msec      TR Vmax:        306.00 cm/s  MR Peak grad:   116.2 mmHg  MR Mean grad:   83.0 mmHg    SHUNTS  MR Vmax:        539.00 cm/s  Systemic VTI:  0.15 m  MR Vmean:       438.0 cm/s   Systemic Diam: 2.00 cm  MR PISA:        1.57 cm  MR PISA Radius: 0.50 cm  MV E velocity: 177.00 cm/s   Recent Labs: No results found for requested labs within last 365 days.   Lipid Panel    Component Value Date/Time   CHOL 125 01/02/2011 0946   TRIG 121.0 01/02/2011 0946   HDL 37.40 (L) 01/02/2011 0946   CHOLHDL 3 01/02/2011 0946   VLDL 24.2 01/02/2011 0946   LDLCALC 63 01/02/2011 0946     Wt Readings from Last 3 Encounters:  09/12/23 78.7 kg  08/21/23 78.2 kg  07/18/22 78.3 kg    Assessment and Plan:   1. Severe Aortic Valve Stenosis: He has severe paradoxical low flow/low gradient aortic stenosis. He has NYHA class 2 symptoms. I have personally reviewed the echo images. The aortic valve is thickened and calcified  with limited leaflet mobility. I think he would benefit from AVR. Given advanced age, he is not a good candidate for conventional AVR by surgical approach. I think he may be a good candidate for TAVR.   I have reviewed the natural history of aortic stenosis with the patient and their family members  who are present today. We have discussed the limitations of medical therapy and the poor prognosis associated with symptomatic aortic stenosis. We have reviewed potential treatment options, including palliative medical therapy, conventional surgical aortic valve replacement, and transcatheter aortic valve replacement. We  discussed treatment options in the context of the patient's specific comorbid medical conditions.   He would like to proceed with planning for TAVR. I will arrange a right and left heart catheterization at [facility] 09/19/23. Risks and benefits of the cath procedure and the valve procedure are reviewed with the patient. After the cath, he will have a cardiac CT, CTA of the chest/abdomen and pelvis and will then be referred to see one of the CT surgeons on our TAVR team.     Labs/ tests ordered today include:   Orders Placed This Encounter  Procedures   Basic metabolic panel   CBC   EKG 12-Lead   Disposition:   F/U will be arranged with the structural team  Signed, Verne Carrow, MD, [facility] 09/12/2023 1:28 PM    [facility] [address] [place], [place], [place]  [address] Phone: [phone]; Fax: [phone]

## 2023-09-13 LAB — CBC
Hematocrit: 37 % — ABNORMAL LOW (ref 37.5–51.0)
Hemoglobin: 12.4 g/dL — ABNORMAL LOW (ref 13.0–17.7)
MCH: 33.1 pg — ABNORMAL HIGH (ref 26.6–33.0)
MCHC: 33.5 g/dL (ref 31.5–35.7)
MCV: 99 fL — ABNORMAL HIGH (ref 79–97)
RBC: 3.75 x10E6/uL — ABNORMAL LOW (ref 4.14–5.80)
RDW: 13.1 % (ref 11.6–15.4)
WBC: 6.1 10*3/uL (ref 3.4–10.8)

## 2023-09-13 LAB — BASIC METABOLIC PANEL
BUN/Creatinine Ratio: 18 (ref 10–24)
BUN: 14 mg/dL (ref 8–27)
CO2: 19 mmol/L — ABNORMAL LOW (ref 20–29)
Calcium: 9.6 mg/dL (ref 8.6–10.2)
Chloride: 99 mmol/L (ref 96–106)
Creatinine, Ser: 0.76 mg/dL (ref 0.76–1.27)
Glucose: 89 mg/dL (ref 70–99)
Potassium: 4.7 mmol/L (ref 3.5–5.2)
Sodium: 136 mmol/L (ref 134–144)
eGFR: 86 mL/min/{1.73_m2} (ref >59)

## 2023-09-18 ENCOUNTER — Telehealth: Payer: Self-pay | Admitting: *Deleted

## 2023-09-18 ENCOUNTER — Ambulatory Visit: Payer: Medicare HMO | Admitting: Podiatry

## 2023-09-18 DIAGNOSIS — Z1589 Genetic susceptibility to other disease: Secondary | ICD-10-CM | POA: Diagnosis not present

## 2023-09-18 DIAGNOSIS — H93012 Transient ischemic deafness, left ear: Secondary | ICD-10-CM | POA: Diagnosis not present

## 2023-09-18 DIAGNOSIS — Z1371 Encounter for nonprocreative screening for genetic disease carrier status: Secondary | ICD-10-CM | POA: Diagnosis not present

## 2023-09-18 DIAGNOSIS — Z822 Family history of deafness and hearing loss: Secondary | ICD-10-CM | POA: Diagnosis not present

## 2023-09-18 DIAGNOSIS — Z8481 Family history of carrier of genetic disease: Secondary | ICD-10-CM | POA: Diagnosis not present

## 2023-09-18 NOTE — Telephone Encounter (Addendum)
Cardiac Catheterization scheduled at [facility] for: Wednesday September 19, 2023 7:30 AM (time change from 8:30 AM) Arrival time [facility] Main Entrance A at: 5:30 AM  Nothing to eat after midnight prior to procedure, clear liquids until 5 AM day of procedure.  Medication instructions: -Hold:  Warfarin-pt reports last dose 09/15/23 and knows to hold until post procedure  Metformin-day of procedure and 48 hours post procedure -Other usual morning medications can be taken with sips of water including aspirin 81 mg.  Plan to go home the same day, you will only stay overnight if medically necessary.  You must have responsible adult to drive you home.  Someone must be with you the first 24 hours after you arrive home.  Reviewed procedure instructions with patient.

## 2023-09-19 ENCOUNTER — Other Ambulatory Visit: Payer: Self-pay

## 2023-09-19 ENCOUNTER — Encounter (HOSPITAL_COMMUNITY)
Admission: RE | Disposition: A | Discharge status: Home / Self Care | Payer: Medicare HMO | Source: Home / Self Care | Attending: Cardiovascular Disease

## 2023-09-19 ENCOUNTER — Ambulatory Visit (HOSPITAL_COMMUNITY)
Admission: RE | Admit: 2023-09-19 | Discharge: 2023-09-19 | Disposition: A | Discharge status: Home / Self Care | Payer: Medicare HMO | Attending: Cardiovascular Disease | Admitting: Cardiovascular Disease

## 2023-09-19 DIAGNOSIS — I4819 Other persistent atrial fibrillation: Secondary | ICD-10-CM | POA: Diagnosis not present

## 2023-09-19 DIAGNOSIS — Z8546 Personal history of malignant neoplasm of prostate: Secondary | ICD-10-CM | POA: Diagnosis not present

## 2023-09-19 DIAGNOSIS — E785 Hyperlipidemia, unspecified: Secondary | ICD-10-CM | POA: Diagnosis not present

## 2023-09-19 DIAGNOSIS — I10 Essential (primary) hypertension: Secondary | ICD-10-CM | POA: Insufficient documentation

## 2023-09-19 DIAGNOSIS — I2510 Atherosclerotic heart disease of native coronary artery without angina pectoris: Secondary | ICD-10-CM | POA: Insufficient documentation

## 2023-09-19 DIAGNOSIS — Z923 Personal history of irradiation: Secondary | ICD-10-CM | POA: Diagnosis not present

## 2023-09-19 DIAGNOSIS — Z7901 Long term (current) use of anticoagulants: Secondary | ICD-10-CM | POA: Diagnosis not present

## 2023-09-19 DIAGNOSIS — Z7984 Long term (current) use of oral hypoglycemic drugs: Secondary | ICD-10-CM | POA: Insufficient documentation

## 2023-09-19 DIAGNOSIS — I350 Nonrheumatic aortic (valve) stenosis: Secondary | ICD-10-CM

## 2023-09-19 DIAGNOSIS — Z01812 Encounter for preprocedural laboratory examination: Secondary | ICD-10-CM

## 2023-09-19 HISTORY — PX: RIGHT HEART CATH AND CORONARY ANGIOGRAPHY: CATH118264

## 2023-09-19 LAB — CBC
HCT: 37.1 % — ABNORMAL LOW (ref 39.0–52.0)
Hemoglobin: 12.2 g/dL — ABNORMAL LOW (ref 13.0–17.0)
MCH: 33.3 pg (ref 26.0–34.0)
MCHC: 32.9 g/dL (ref 30.0–36.0)
MCV: 101.4 fL — ABNORMAL HIGH (ref 80.0–100.0)
Platelets: 139 10*3/uL — ABNORMAL LOW (ref 150–400)
RBC: 3.66 MIL/uL — ABNORMAL LOW (ref 4.22–5.81)
RDW: 13.9 % (ref 11.5–15.5)
WBC: 5.4 10*3/uL (ref 4.0–10.5)
nRBC: 0 % (ref 0.0–0.2)

## 2023-09-19 LAB — PROTIME-INR
INR: 1.1 (ref 0.8–1.2)
Prothrombin Time: 14.8 s (ref 11.4–15.2)

## 2023-09-19 LAB — GLUCOSE, CAPILLARY
Glucose-Capillary: 137 mg/dL — ABNORMAL HIGH (ref 70–99)
Glucose-Capillary: 130 mg/dL — ABNORMAL HIGH (ref 70–99)

## 2023-09-19 SURGERY — RIGHT HEART CATH AND CORONARY ANGIOGRAPHY
Anesthesia: LOCAL

## 2023-09-19 MED ORDER — SODIUM CHLORIDE 0.9 % WEIGHT BASED INFUSION: 3.0000 mL/kg/h | INTRAVENOUS | Status: AC

## 2023-09-19 MED ORDER — SODIUM CHLORIDE 0.9 % IV SOLN: 250.0000 mL | INTRAVENOUS | Status: DC | PRN

## 2023-09-19 MED ORDER — LABETALOL HCL 5 MG/ML IV SOLN: 10.0000 mg | INTRAVENOUS | Status: DC | PRN

## 2023-09-19 MED ORDER — ONDANSETRON HCL 4 MG/2ML IJ SOLN: 4.0000 mg | Freq: Four times a day (QID) | INTRAMUSCULAR | Status: DC | PRN

## 2023-09-19 MED ORDER — SODIUM CHLORIDE 0.9% FLUSH: 3.0000 mL | INTRAVENOUS | Status: DC | PRN

## 2023-09-19 MED ORDER — ASPIRIN 81 MG PO CHEW
81.0000 mg | CHEWABLE_TABLET | ORAL | Status: DC
Start: 2023-09-20 — End: 2023-09-19

## 2023-09-19 MED ORDER — VERAPAMIL HCL 2.5 MG/ML IV SOLN: INTRAVENOUS | Status: DC | PRN

## 2023-09-19 MED ORDER — IOHEXOL 350 MG/ML SOLN
INTRAVENOUS | Status: DC | PRN
  Administered 2023-09-19: 35 mL

## 2023-09-19 MED ORDER — VERAPAMIL HCL 2.5 MG/ML IV SOLN
INTRAVENOUS | Status: AC
Start: 2023-09-19 — End: ?
  Filled 2023-09-19: qty 2

## 2023-09-19 MED ORDER — HEPARIN SODIUM (PORCINE) 1000 UNIT/ML IJ SOLN
INTRAMUSCULAR | Status: AC
  Filled 2023-09-19: qty 10

## 2023-09-19 MED ORDER — ACETAMINOPHEN 325 MG PO TABS: 650.0000 mg | ORAL_TABLET | ORAL | Status: DC | PRN

## 2023-09-19 MED ORDER — ASPIRIN 81 MG PO CHEW: 81.0000 mg | CHEWABLE_TABLET | ORAL | Status: DC

## 2023-09-19 MED ORDER — SODIUM CHLORIDE 0.9 % IV SOLN: INTRAVENOUS | Status: AC

## 2023-09-19 MED ORDER — FENTANYL CITRATE (PF) 100 MCG/2ML IJ SOLN
INTRAMUSCULAR | Status: DC | PRN
  Administered 2023-09-19: 25 ug via INTRAVENOUS

## 2023-09-19 MED ORDER — MIDAZOLAM HCL 2 MG/2ML IJ SOLN
INTRAMUSCULAR | Status: AC
  Filled 2023-09-19: qty 2

## 2023-09-19 MED ORDER — HEPARIN (PORCINE) IN NACL 1000-0.9 UT/500ML-% IV SOLN
INTRAVENOUS | Status: DC | PRN
  Administered 2023-09-19 (×2): 500 mL

## 2023-09-19 MED ORDER — HYDRALAZINE HCL 20 MG/ML IJ SOLN: 10.0000 mg | INTRAMUSCULAR | Status: DC | PRN

## 2023-09-19 MED ORDER — SODIUM CHLORIDE 0.9 % WEIGHT BASED INFUSION: 1.0000 mL/kg/h | INTRAVENOUS | Status: DC

## 2023-09-19 MED ORDER — LIDOCAINE HCL (PF) 1 % IJ SOLN
INTRAMUSCULAR | Status: DC | PRN
  Administered 2023-09-19: 12 mL
  Administered 2023-09-19 (×2): 2 mL

## 2023-09-19 MED ORDER — LIDOCAINE HCL (PF) 1 % IJ SOLN
INTRAMUSCULAR | Status: AC
  Filled 2023-09-19: qty 30

## 2023-09-19 MED ORDER — SODIUM CHLORIDE 0.9% FLUSH: 3.0000 mL | Freq: Two times a day (BID) | INTRAVENOUS | Status: DC

## 2023-09-19 MED ORDER — MIDAZOLAM HCL 2 MG/2ML IJ SOLN
INTRAMUSCULAR | Status: DC | PRN
  Administered 2023-09-19: 1 mg via INTRAVENOUS

## 2023-09-19 MED ORDER — FENTANYL CITRATE (PF) 100 MCG/2ML IJ SOLN
INTRAMUSCULAR | Status: AC
  Filled 2023-09-19: qty 2

## 2023-09-19 SURGICAL SUPPLY — 14 items
CATH 5FR JL3.5 JR4 ANG PIG MP (CATHETERS) IMPLANT
CATH BALLN WEDGE 5F 110CM (CATHETERS) IMPLANT
CLOSURE MYNX CONTROL 5F (Vascular Products) IMPLANT
DEVICE RAD COMP TR BAND LRG (VASCULAR PRODUCTS) IMPLANT
GLIDESHEATH SLEND SS 6F .021 (SHEATH) IMPLANT
GUIDEWIRE INQWIRE 1.5J.035X260 (WIRE) IMPLANT
INQWIRE 1.5J .035X260CM (WIRE) ×1
PACK CARDIAC CATHETERIZATION (CUSTOM PROCEDURE TRAY) ×2 IMPLANT
SET ATX-X65L (MISCELLANEOUS) IMPLANT
SHEATH GLIDE SLENDER 4/5FR (SHEATH) IMPLANT
SHEATH PINNACLE 5F 10CM (SHEATH) IMPLANT
SHEATH PROBE COVER 6X72 (BAG) IMPLANT
WIRE EMERALD 3MM-J .035X150CM (WIRE) IMPLANT
WIRE MICRO SET SILHO 5FR 7 (SHEATH) IMPLANT

## 2023-09-19 NOTE — Progress Notes (Signed)
TR BAND REMOVAL  LOCATION:    right radial  DEFLATED PER PROTOCOL:    Yes.    TIME BAND OFF / DRESSING APPLIED: 09/19/23 at 0955   SITE UPON ARRIVAL:    Level 0  SITE AFTER BAND REMOVAL:    Level 0  CIRCULATION SENSATION AND MOVEMENT:    Within Normal Limits   Yes.    COMMENTS:

## 2023-09-19 NOTE — Interval H&P Note (Signed)
History and Physical Interval Note:  09/19/2023 7:12 AM  Henry Holland  has presented today for surgery, with the diagnosis of severe aortic stenosis.  The various methods of treatment have been discussed with the patient and family. After consideration of risks, benefits and other options for treatment, the patient has consented to  Procedure(s): RIGHT/LEFT HEART CATH AND CORONARY ANGIOGRAPHY (N/A) as a surgical intervention.  The patient's history has been reviewed, patient examined, no change in status, stable for surgery.  I have reviewed the patient's chart and labs.  Questions were answered to the patient's satisfaction.    Cath Lab Visit (complete for each Cath Lab visit)  Clinical Evaluation Leading to the Procedure:   ACS: No.  Non-ACS:    Anginal Classification: CCS II  Anti-ischemic medical therapy: Minimal Therapy (1 class of medications)  Non-Invasive Test Results: No non-invasive testing performed  Prior CABG: No previous CABG        Verne Carrow

## 2023-09-19 NOTE — Discharge Instructions (Addendum)
Resume coumadin tonight.   Brachial Site Care   This sheet gives you information about how to care for yourself after your procedure. Your health care provider may also give you more specific instructions. If you have problems or questions, contact your health care provider. What can I expect after the procedure? After the procedure, it is common to have: Bruising and tenderness at the catheter insertion area. Follow these instructions at home:  Insertion site care Follow instructions from your health care provider about how to take care of your insertion site. Make sure you: Wash your hands with soap and water before you change your bandage (dressing). If soap and water are not available, use hand sanitizer. Remove your dressing as told by your health care provider. In 24 hours Check your insertion site every day for signs of infection. Check for: Redness, swelling, or pain. Pus or a bad smell. Warmth. You may shower 24-48 hours after the procedure. Do not apply powder or lotion to the site.  Activity For 24 hours after the procedure, or as directed by your health care provider: Do not push or pull heavy objects with the affected arm. Do not drive yourself home from the hospital or clinic. You may drive 24 hours after the procedure unless your health care provider tells you not to. Do not lift anything that is heavier than 10 lb (4.5 kg), or the limit that you are told, until your health care provider says that it is safe.  For 24 hours

## 2023-09-20 ENCOUNTER — Encounter (HOSPITAL_COMMUNITY): Payer: Self-pay | Admitting: Cardiovascular Disease

## 2023-09-20 LAB — POCT I-STAT EG7
Acid-base deficit: 1 mmol/L (ref 0.0–2.0)
Acid-base deficit: 2 mmol/L (ref 0.0–2.0)
Bicarbonate: 23.4 mmol/L (ref 20.0–28.0)
Bicarbonate: 24.5 mmol/L (ref 20.0–28.0)
Calcium, Ion: 1.14 mmol/L — ABNORMAL LOW (ref 1.15–1.40)
Calcium, Ion: 1.22 mmol/L (ref 1.15–1.40)
HCT: 33 % — ABNORMAL LOW (ref 39.0–52.0)
HCT: 33 % — ABNORMAL LOW (ref 39.0–52.0)
Hemoglobin: 11.2 g/dL — ABNORMAL LOW (ref 13.0–17.0)
Hemoglobin: 11.2 g/dL — ABNORMAL LOW (ref 13.0–17.0)
O2 Saturation: 70 %
O2 Saturation: 71 %
Patient temperature: 42
Potassium: 4 mmol/L (ref 3.5–5.1)
Potassium: 4.3 mmol/L (ref 3.5–5.1)
Sodium: 136 mmol/L (ref 135–145)
Sodium: 138 mmol/L (ref 135–145)
TCO2: 25 mmol/L (ref 22–32)
TCO2: 26 mmol/L (ref 22–32)
pCO2, Ven: 43 mm[Hg] — ABNORMAL LOW (ref 44–60)
pCO2, Ven: 50.7 mm[Hg] (ref 44–60)
pH, Ven: 7.294 (ref 7.25–7.43)
pH, Ven: 7.363 (ref 7.25–7.43)
pO2, Ven: 38 mm[Hg] (ref 32–45)
pO2, Ven: 55 mm[Hg] — ABNORMAL HIGH (ref 32–45)

## 2023-09-20 LAB — POCT I-STAT 7, (LYTES, BLD GAS, ICA,H+H)
Acid-base deficit: 2 mmol/L (ref 0.0–2.0)
Bicarbonate: 22.4 mmol/L (ref 20.0–28.0)
Calcium, Ion: 1.17 mmol/L (ref 1.15–1.40)
HCT: 32 % — ABNORMAL LOW (ref 39.0–52.0)
Hemoglobin: 10.9 g/dL — ABNORMAL LOW (ref 13.0–17.0)
O2 Saturation: 99 %
Potassium: 4.1 mmol/L (ref 3.5–5.1)
Sodium: 137 mmol/L (ref 135–145)
TCO2: 24 mmol/L (ref 22–32)
pCO2 arterial: 37.5 mm[Hg] (ref 32–48)
pH, Arterial: 7.384 (ref 7.35–7.45)
pO2, Arterial: 120 mm[Hg] — ABNORMAL HIGH (ref 83–108)

## 2023-09-25 ENCOUNTER — Encounter: Payer: Self-pay | Admitting: Podiatry

## 2023-09-25 ENCOUNTER — Ambulatory Visit (INDEPENDENT_AMBULATORY_CARE_PROVIDER_SITE_OTHER): Payer: Medicare HMO | Admitting: Podiatry

## 2023-09-25 DIAGNOSIS — D2372 Other benign neoplasm of skin of left lower limb, including hip: Secondary | ICD-10-CM | POA: Diagnosis not present

## 2023-09-26 NOTE — Progress Notes (Signed)
He presents today chief complaint of a painful porokeratotic lesion beneath the third toe of the left foot.  Objective: Vital signs are stable alert oriented x 3.  Pulses are palpable.  Has a solitary porokeratotic lesion benign nature does not appear to be verrucoid.  Assessment: Benign skin lesion.  Plan: Debridement of benign skin lesion follow-up on an as-needed basis.

## 2023-10-01 ENCOUNTER — Encounter: Payer: Self-pay | Admitting: Physician Assistant

## 2023-10-01 DIAGNOSIS — I700 Atherosclerosis of aorta: Secondary | ICD-10-CM | POA: Diagnosis not present

## 2023-10-01 DIAGNOSIS — E1129 Type 2 diabetes mellitus with other diabetic kidney complication: Secondary | ICD-10-CM | POA: Diagnosis not present

## 2023-10-01 DIAGNOSIS — I480 Paroxysmal atrial fibrillation: Secondary | ICD-10-CM | POA: Diagnosis not present

## 2023-10-01 DIAGNOSIS — I340 Nonrheumatic mitral (valve) insufficiency: Secondary | ICD-10-CM | POA: Diagnosis not present

## 2023-10-01 DIAGNOSIS — M5441 Lumbago with sciatica, right side: Secondary | ICD-10-CM | POA: Diagnosis not present

## 2023-10-01 DIAGNOSIS — E1140 Type 2 diabetes mellitus with diabetic neuropathy, unspecified: Secondary | ICD-10-CM | POA: Diagnosis not present

## 2023-10-01 DIAGNOSIS — E291 Testicular hypofunction: Secondary | ICD-10-CM | POA: Diagnosis not present

## 2023-10-01 DIAGNOSIS — I10 Essential (primary) hypertension: Secondary | ICD-10-CM | POA: Diagnosis not present

## 2023-10-01 DIAGNOSIS — Z8546 Personal history of malignant neoplasm of prostate: Secondary | ICD-10-CM | POA: Diagnosis not present

## 2023-10-01 DIAGNOSIS — I350 Nonrheumatic aortic (valve) stenosis: Secondary | ICD-10-CM | POA: Diagnosis not present

## 2023-10-01 DIAGNOSIS — D6869 Other thrombophilia: Secondary | ICD-10-CM | POA: Diagnosis not present

## 2023-10-01 DIAGNOSIS — E785 Hyperlipidemia, unspecified: Secondary | ICD-10-CM | POA: Diagnosis not present

## 2023-10-02 ENCOUNTER — Ambulatory Visit (HOSPITAL_COMMUNITY)
Admission: RE | Admit: 2023-10-02 | Discharge: 2023-10-02 | Disposition: A | Discharge status: Home / Self Care | Payer: Medicare HMO | Source: Ambulatory Visit | Attending: Cardiovascular Disease | Admitting: Cardiovascular Disease

## 2023-10-02 DIAGNOSIS — I350 Nonrheumatic aortic (valve) stenosis: Secondary | ICD-10-CM | POA: Diagnosis not present

## 2023-10-02 DIAGNOSIS — Z01810 Encounter for preprocedural cardiovascular examination: Secondary | ICD-10-CM | POA: Diagnosis not present

## 2023-10-02 DIAGNOSIS — N200 Calculus of kidney: Secondary | ICD-10-CM | POA: Diagnosis not present

## 2023-10-02 DIAGNOSIS — Q600 Renal agenesis, unilateral: Secondary | ICD-10-CM | POA: Diagnosis not present

## 2023-10-02 DIAGNOSIS — K828 Other specified diseases of gallbladder: Secondary | ICD-10-CM | POA: Diagnosis not present

## 2023-10-02 MED ORDER — IOHEXOL 350 MG/ML SOLN
95.0000 mL | Freq: Once | INTRAVENOUS | Status: AC | PRN
  Administered 2023-10-02: 95 mL via INTRAVENOUS

## 2023-10-03 NOTE — Progress Notes (Signed)
 [address]       [place] [address]             [id number]           Henry Holland [facility] Medical Record #[id number] Date of Birth: 10/27/1932  Henry Holland [name] Henry Holland Henry Holland Henry Holland., MD  Chief Complaint:  increasing fatigue   History of Present Illness:     Pt is an 89 yo male who over the past year has felt he hasn't the same energy he had in the past with increasing DOE. He has no syncope or cp. Pt works out several days a week at [employer] and only issue is more fatigued. He plays golf. He was found on echo to have severy LFLG AS with a mean gradient of and a valve area of 0.76 cm2 with a DVI of 0.24. He has normal lf function. He underwent cath without significant CAD and CTA which has acceptable anatomy for TAVR. Pt has also history of afib on coumadin and prostate cancer      Past Medical History:  Diagnosis Date   Arthritis    Chronic atrial fibrillation (HCC)    History of radiation therapy    Hyperlipidemia    Hypertension    Prostate cancer (HCC)    Severe aortic stenosis     Past Surgical History:  Procedure Laterality Date   CATARACT EXTRACTION, BILATERAL     HERNIA REPAIR     X2   RIGHT HEART CATH AND CORONARY ANGIOGRAPHY N/A 09/19/2023   Procedure: RIGHT HEART CATH AND CORONARY ANGIOGRAPHY;  Surgeon: Henry Holland [name], MD;  Location: [facility];  Service: Cardiovascular;  Laterality: N/A;    Social History   Tobacco Use  Smoking Status Never  Smokeless Tobacco Never    Social History   Substance and Sexual Activity  Alcohol  Use No    Social History   Socioeconomic History   Marital status: Married    Spouse name: Not on file   Number of children: 1   Years of education: Not on file   Highest education level: Not on file  Occupational History   Occupation: [employer]: [employer]   Occupation: Retired from [employer]  Tobacco Use   Smoking status: Never    Smokeless tobacco: Never  Vaping Use   Vaping status: Never Used  Substance and Sexual Activity   Alcohol  use: No   Drug use: No   Sexual activity: Not on file  Other Topics Concern   Not on file  Social History Narrative   Not on file   Social Drivers of Health   Financial Resource Strain: Not on file  Food Insecurity: Not on file  Transportation Needs: Not on file  Physical Activity: Not on file  Stress: Not on file  Social Connections: Not on file  Intimate Partner Violence: Not on file    Allergies  Allergen Reactions   Atorvastatin Other (See Comments)    Muscle aches and pain   Ezetimibe Other (See Comments)    Muscle aches and pain   Fenofibrate Other (See Comments)    Muscle aches and pain    Current Outpatient Medications  Medication Sig Dispense Refill   ascorbic acid (VITAMIN C) 500 MG tablet Take 500 mg by mouth daily.     Cholecalciferol (VITAMIN D3) 1000 units CAPS Take 1,000 Units by mouth daily.     diltiazem  (  CARDIZEM  CD) 120 MG 24 hr capsule Take 1 capsule (120 mg total) by mouth daily. 90 capsule 3   glucose blood (ONETOUCH VERIO) test strip Use to check blood sugar 2 times a day as directed Dx E11.4     JANTOVEN 10 MG tablet Take 10 mg by mouth every Monday, Wednesday, and Friday. In the morning     JANTOVEN 2.5 MG tablet Take 2.5 mg by mouth See admin instructions. Take with 5 mg for a total of 7.5 mg on Tues., Thurs., Sat., and Sunday in the morning     Lancets (ONETOUCH ULTRASOFT) lancets use for OneTouch Ultra meter to test up to bid     losartan (COZAAR) 100 MG tablet Take 100 mg by mouth daily.       metFORMIN (GLUCOPHAGE-XR) 500 MG 24 hr tablet Take 500 mg by mouth 2 (two) times daily with a meal.     prednisoLONE acetate (PRED FORTE) 1 % ophthalmic suspension Place 1 drop into the left eye 4 (four) times daily.     rosuvastatin  (CRESTOR ) 5 MG tablet Take 5 mg by mouth daily.       warfarin (JANTOVEN) 5 MG tablet Take 5 mg by mouth See admin  instructions. Take with 2.5 mg for a total of 7.5 mg on Tues., Thurs., Sat., and Sun. In the morning     No current facility-administered medications for this visit.     Family History  Problem Relation Age of Onset   Stroke Mother 32       Physical Exam: Appears very well for age Lungs: clear Card: IRR with 3/6 sem Ext: warm Neuro: intact     Diagnostic Studies & Laboratory data: I have personally reviewed the following studies and agree with the findings   TTE (06/2023) IMPRESSIONS     1. Calcified aortic valve with severe low flow low gradient AS (mean  gradient 26 mmHg; AVA 0.7 cm2 and DI 0.24).   2. Left ventricular ejection fraction, by estimation, is 60 to 65%. The  left ventricle has normal function. The left ventricle has no regional  wall motion abnormalities. There is mild left ventricular hypertrophy.  Left ventricular diastolic parameters  are indeterminate. Elevated left atrial pressure.   3. Right ventricular systolic function is normal. The right ventricular  size is mildly enlarged. There is moderately elevated pulmonary artery  systolic pressure.   4. Left atrial size was severely dilated.   5. Right atrial size was severely dilated.   6. The mitral valve is normal in structure. Mild to moderate mitral valve  regurgitation. No evidence of mitral stenosis. Moderate mitral annular  calcification.   7. The aortic valve is tricuspid. Aortic valve regurgitation is not  visualized. Severe aortic valve stenosis.   8. The inferior vena cava is dilated in size with <50% respiratory  variability, suggesting right atrial pressure of 15 mmHg.   FINDINGS   Left Ventricle: Left ventricular ejection fraction, by estimation, is 60  to 65%. The left ventricle has normal function. The left ventricle has no  regional wall motion abnormalities. The left ventricular internal cavity  size was normal in size. There is   mild left ventricular hypertrophy. Left  ventricular diastolic parameters  are indeterminate. Elevated left atrial pressure.   Right Ventricle: The right ventricular size is mildly enlarged. No  increase in right ventricular wall thickness. Right ventricular systolic  function is normal. There is moderately elevated pulmonary artery systolic  pressure. The tricuspid regurgitant  velocity is 3.06 m/s, and with an assumed right atrial pressure of 15  mmHg, the estimated right ventricular systolic pressure is 52.5 mmHg.   Left Atrium: Left atrial size was severely dilated.   Right Atrium: Right atrial size was severely dilated.   Pericardium: There is no evidence of pericardial effusion.   Mitral Valve: The mitral valve is normal in structure. Moderate mitral  annular calcification. Mild to moderate mitral valve regurgitation. No  evidence of mitral valve stenosis.   Tricuspid Valve: The tricuspid valve is normal in structure. Tricuspid  valve regurgitation is mild . No evidence of tricuspid stenosis.   Aortic Valve: The aortic valve is tricuspid. Aortic valve regurgitation is  not visualized. Severe aortic stenosis is present. Aortic valve mean  gradient measures 26.0 mmHg. Aortic valve peak gradient measures 46.8  mmHg. Aortic valve area, by VTI measures   0.76 cm.   Pulmonic Valve: The pulmonic valve was normal in structure. Pulmonic valve  regurgitation is trivial. No evidence of pulmonic stenosis.   Aorta: The aortic root is normal in size and structure.   Venous: The inferior vena cava is dilated in size with less than 50%  respiratory variability, suggesting right atrial pressure of 15 mmHg.   IAS/Shunts: No atrial level shunt detected by color flow Doppler.   Additional Comments: Calcified aortic valve with severe low flow low  gradient AS (mean gradient 26 mmHg; AVA 0.7 cm2 and DI 0.24).     LEFT VENTRICLE  PLAX 2D  LVIDd:         4.91 cm   Diastology  LVIDs:         3.42 cm   LV e' medial:    4.43 cm/s   LV PW:         1.17 cm   LV E/e' medial:  40.0  LV IVS:        1.32 cm   LV e' lateral:   8.33 cm/s  LVOT diam:     2.00 cm   LV E/e' lateral: 21.2  LV SV:         48  LV SV Index:   24  LVOT Area:     3.14 cm                             3D Volume EF:                           3D EF:        53 %                           LV EDV:       139 ml                           LV ESV:       65 ml                           LV SV:        74 ml   RIGHT VENTRICLE  RV Basal diam:  4.42 cm  RV Mid diam:    3.03 cm  RV S prime:     12.30 cm/s  TAPSE (M-mode): 2.0 cm   LEFT ATRIUM  Index        RIGHT ATRIUM           Index  LA diam:        5.50 cm  2.72 cm/m   RA Area:     33.60 cm  LA Vol (A2C):   150.0 ml 74.22 ml/m  RA Volume:   136.00 ml 67.29 ml/m  LA Vol (A4C):   178.0 ml 88.07 ml/m  LA Biplane Vol: 165.0 ml 81.64 ml/m   AORTIC VALVE  AV Area (Vmax):    0.71 cm  AV Area (Vmean):   0.70 cm  AV Area (VTI):     0.76 cm  AV Vmax:           342.00 cm/s  AV Vmean:          233.000 cm/s  AV VTI:            0.629 m  AV Peak Grad:      46.8 mmHg  AV Mean Grad:      26.0 mmHg  LVOT Vmax:         76.80 cm/s  LVOT Vmean:        51.800 cm/s  LVOT VTI:          0.152 m  LVOT/AV VTI ratio: 0.24    AORTA  Ao Root diam: 3.40 cm  Ao Asc diam:  3.30 cm   MITRAL VALVE                 TRICUSPID VALVE  MV Area (PHT): 4.06 cm      TR Peak grad:   37.5 mmHg  MV Decel Time: 187 msec      TR Vmax:        306.00 cm/s  MR Peak grad:   116.2 mmHg  MR Mean grad:   83.0 mmHg    SHUNTS  MR Vmax:        539.00 cm/s  Systemic VTI:  0.15 m  MR Vmean:       438.0 cm/s   Systemic Diam: 2.00 cm  MR PISA:        1.57 cm  MR PISA Radius: 0.50 cm  MV E velocity: 177.00 cm/s   CATH (08/2023) Coronary Findings  Diagnostic Dominance: Right Left Anterior Descending  Vessel is large.  Mid LAD to Dist LAD lesion is 30% stenosed.    Left Circumflex  Vessel is large.    Right Coronary  Artery  Vessel is large.  Prox RCA to Mid RCA lesion is 20% stenosed.      Recent Radiology Findings:   CT CORONARY MORPH W/CTA COR W/SCORE W/CA W/CM &/OR WO/CM Result Date: 10/02/2023 CLINICAL DATA:  Severe Aortic Stenosis. EXAM: Cardiac TAVR CT TECHNIQUE: A non-contrast, gated CT scan was obtained with axial slices of 3 mm through the heart for aortic valve calcium  scoring. A 100 kV retrospective, gated, contrast cardiac scan was obtained. Gantry rotation speed was 250 msecs and collimation was 0.6 mm. Nitroglycerin  was not given. The 3D data set was reconstructed in 5% intervals of the 0-95% of the R-R cycle. Systolic and diastolic phases were analyzed on a dedicated workstation using MPR, MIP, and VRT modes. The patient received 100 cc of contrast. FINDINGS: Image quality: Excellent. Noise artifact is: Limited. Valve Morphology: Tricuspid aortic valve with diffuse, severe calcifications. Severely restricted leaflet motion in systole. Aortic Valve Calcium  score: 1694 Aortic annular dimension: Phase assessed: 25% Annular area: 458 mm2 Annular perimeter: 77.3  mm Max diameter: 26.9 mm Min diameter: 21.3 mm Annular and subannular calcification: Mild, single calcification under the LCC. Membranous septum length: 5.5 mm Optimal coplanar projection: LAO 10 CRA 4 Coronary Artery Height above Annulus: Left Main: 12.2 mm Right Coronary: 12.4 mm Sinus of Valsalva Measurements: Non-coronary: 31 mm Right-coronary: 31 mm Left-coronary: 33 mm Sinus of Valsalva Height: Non-coronary: 23.6 mm Right-coronary: 20.8 mm Left-coronary: 22.5 mm Sinotubular Junction: 30 mm Ascending Thoracic Aorta: 33 mm Coronary Arteries: Normal coronary origin. Right dominance. The study was performed without use of NTG and is insufficient for plaque evaluation. Please refer to recent cardiac catheterization for coronary assessment. 3-vessel coronary calcium . Cardiac Morphology: Right Atrium: Right atrial size is severely dilated. Right  Ventricle: The right ventricular cavity is dilated. Left Atrium: Left atrial size is severely dilated. Filling defect in the LAA concerning for thrombus. Left Ventricle: The ventricular cavity size is within normal limits. Pulmonary arteries: Normal in size without proximal filling defect. Pulmonary veins: Normal pulmonary venous drainage. Pericardium: Normal thickness with no significant effusion or calcium  present. Mitral Valve: The mitral valve is degenerative with severe annular calcium . Extra-cardiac findings: See attached radiology report for non-cardiac structures. IMPRESSION: 1. Annular measurements support a 26 mm S3 or 29 mm Evolut Pro. 2. Mild, single annular calcification under the LCC. 3. Sufficient coronary to annulus distance. 4. Optimal Fluoroscopic Angle for Delivery: LAO 10 CRA 4 5. Filling defect in the LAA concerning for thrombus (does not clear on the CTA runoff images). 6. Severe biatrial enlargement. 7. The mitral valve is degenerative with severe annular calcium . Darryle T. Barbaraann, MD Electronically Signed   By: Darryle Barbaraann M.D.   On: 10/02/2023 22:38      Recent Lab Findings: Lab Results  Component Value Date   WBC 5.4 09/19/2023   HGB 10.9 (L) 09/19/2023   HCT 32.0 (L) 09/19/2023   PLT 139 (L) 09/19/2023   GLUCOSE 89 09/12/2023   CHOL 125 01/02/2011   TRIG 121.0 01/02/2011   HDL 37.40 (L) 01/02/2011   LDLCALC 63 01/02/2011   ALT 28 01/02/2011   AST 22 01/02/2011   NA 137 09/19/2023   K 4.1 09/19/2023   CL 99 09/12/2023   CREATININE 0.76 09/12/2023   BUN 14 09/12/2023   CO2 19 (L) 09/12/2023   TSH 1.38 01/02/2011   INR 1.1 09/19/2023      Assessment / Plan:     89 yo male with NYHA class 2 symptoms of severe LFLG AS with normal LV function and no CAD. Pt has a class I indication for AVR and with his age and comorbidities would best be served with TAVR. He understands all the risks goals and recovery from TAVR and wishes to proceed. He is not a bail out  candidate. He will have femoral access and a 26mm sapien valve   I have spent 60 min in review of the records, viewing studies and in face to face with patient and in coordination of future care    Deward Kallman 10/03/2023 12:01 PM

## 2023-10-04 ENCOUNTER — Institutional Professional Consult (permissible substitution) (INDEPENDENT_AMBULATORY_CARE_PROVIDER_SITE_OTHER): Payer: Medicare HMO | Admitting: Thoracic Surgery (Cardiothoracic Vascular Surgery)

## 2023-10-04 ENCOUNTER — Encounter: Payer: Self-pay | Admitting: Thoracic Surgery (Cardiothoracic Vascular Surgery)

## 2023-10-04 VITALS — BP 135/73 | HR 78 | Resp 20 | Ht 73.0 in | Wt 168.0 lb

## 2023-10-04 DIAGNOSIS — I350 Nonrheumatic aortic (valve) stenosis: Secondary | ICD-10-CM | POA: Diagnosis not present

## 2023-10-04 NOTE — Patient Instructions (Signed)
 TAVR

## 2023-10-09 ENCOUNTER — Telehealth: Payer: Self-pay

## 2023-10-09 NOTE — Telephone Encounter (Signed)
-----   Message from Verne Carrow sent at 10/08/2023 11:59 AM EST ----- Regarding: RE: Booking patient for TAVR, ? lovenox bridge Yes. I think we need to do that. Thayer Ohm ----- Message ----- From: Iona Coach, RN Sent: 10/08/2023   9:59 AM EST To: Kathleene Hazel, MD; Eugenio Hoes, MD Subject: Booking patient for TAVR, ? lovenox bridge     Hey,   Both Cardiac CT and CTA confirm presence of LAA thrombus.  The pt takes warfarin and this was held prior to cardiac cath with no lovenox bridge.  Now with findings on CT would you like lovenox bridge for TAVR?  Thanks, Leotis Shames

## 2023-10-09 NOTE — Telephone Encounter (Signed)
Pt will be scheduled for TAVR on 2/25. Due to LAA thrombus noted on pre TAVR CT scan the patient will need lovenox bridge in preparation for surgery.  The pt's anticoagulation is managed by his PCP, Dr Clelia Croft. I will forward this message to Dr Clelia Croft to determine if he would like to manage lovenox bridge or if he would like the structural heart valve team to manage.       Kathleene Hazel, MD  Iona Coach, RN; Eugenio Hoes, MD Yes. I think we need to do that. Thayer Ohm       Previous Messages    ----- Message ----- From: Iona Coach, RN Sent: 10/08/2023   9:59 AM EST To: Kathleene Hazel, MD; Eugenio Hoes, MD Subject: Booking patient for TAVR, ? lovenox bridge    Hey,  Both Cardiac CT and CTA confirm presence of LAA thrombus.  The pt takes warfarin and this was held prior to cardiac cath with no lovenox bridge.  Now with findings on CT would you like lovenox bridge for TAVR?  Thanks, Leotis Shames

## 2023-10-12 ENCOUNTER — Other Ambulatory Visit: Payer: Self-pay

## 2023-10-12 DIAGNOSIS — I350 Nonrheumatic aortic (valve) stenosis: Secondary | ICD-10-CM

## 2023-10-12 NOTE — Telephone Encounter (Signed)
I received a call from [place] with Dr Alver Fisher office.  Dr Clelia Croft would like to manage the pt's lovenox bridge and they plan to see the patient on 2/18 for PT/INR and plan to provide the patient with lovenox instructions at that time.

## 2023-10-12 NOTE — Telephone Encounter (Signed)
I left a voicemail for [facility], who is currently working with Dr Clelia Croft, to contact me in regards to pt needing lovenox bridge for TAVR.

## 2023-10-16 DIAGNOSIS — I480 Paroxysmal atrial fibrillation: Secondary | ICD-10-CM | POA: Diagnosis not present

## 2023-10-16 DIAGNOSIS — Z7901 Long term (current) use of anticoagulants: Secondary | ICD-10-CM | POA: Diagnosis not present

## 2023-10-16 DIAGNOSIS — D6869 Other thrombophilia: Secondary | ICD-10-CM | POA: Diagnosis not present

## 2023-10-19 ENCOUNTER — Encounter (HOSPITAL_COMMUNITY)
Admission: RE | Admit: 2023-10-19 | Discharge: 2023-10-19 | Disposition: A | Discharge status: Home / Self Care | Payer: Medicare HMO | Source: Ambulatory Visit | Attending: Cardiovascular Disease | Admitting: Cardiovascular Disease

## 2023-10-19 ENCOUNTER — Ambulatory Visit (HOSPITAL_COMMUNITY)
Admission: RE | Admit: 2023-10-19 | Discharge: 2023-10-19 | Disposition: A | Discharge status: Home / Self Care | Payer: Medicare HMO | Source: Ambulatory Visit | Attending: Cardiovascular Disease | Admitting: Cardiovascular Disease

## 2023-10-19 ENCOUNTER — Other Ambulatory Visit: Payer: Self-pay

## 2023-10-19 DIAGNOSIS — I4519 Other right bundle-branch block: Secondary | ICD-10-CM | POA: Insufficient documentation

## 2023-10-19 DIAGNOSIS — I517 Cardiomegaly: Secondary | ICD-10-CM | POA: Diagnosis not present

## 2023-10-19 DIAGNOSIS — Z01812 Encounter for preprocedural laboratory examination: Secondary | ICD-10-CM | POA: Diagnosis present

## 2023-10-19 DIAGNOSIS — J90 Pleural effusion, not elsewhere classified: Secondary | ICD-10-CM | POA: Insufficient documentation

## 2023-10-19 DIAGNOSIS — I350 Nonrheumatic aortic (valve) stenosis: Secondary | ICD-10-CM | POA: Insufficient documentation

## 2023-10-19 DIAGNOSIS — Z01818 Encounter for other preprocedural examination: Secondary | ICD-10-CM | POA: Insufficient documentation

## 2023-10-19 DIAGNOSIS — Z01810 Encounter for preprocedural cardiovascular examination: Secondary | ICD-10-CM | POA: Diagnosis present

## 2023-10-19 LAB — URINALYSIS, ROUTINE W REFLEX MICROSCOPIC
Bacteria, UA: NONE SEEN
Bilirubin Urine: NEGATIVE
Glucose, UA: NEGATIVE mg/dL
Hgb urine dipstick: NEGATIVE
Ketones, ur: NEGATIVE mg/dL
Leukocytes,Ua: NEGATIVE
Nitrite: NEGATIVE
Protein, ur: 30 mg/dL — AB
Specific Gravity, Urine: 1.016 (ref 1.005–1.030)
pH: 6 (ref 5.0–8.0)

## 2023-10-19 LAB — COMPREHENSIVE METABOLIC PANEL
ALT: 19 U/L (ref 0–44)
AST: 23 U/L (ref 15–41)
Albumin: 3.8 g/dL (ref 3.5–5.0)
Alkaline Phosphatase: 35 U/L — ABNORMAL LOW (ref 38–126)
Anion gap: 10 (ref 5–15)
BUN: 15 mg/dL (ref 8–23)
CO2: 25 mmol/L (ref 22–32)
Calcium: 9.4 mg/dL (ref 8.9–10.3)
Chloride: 98 mmol/L (ref 98–111)
Creatinine, Ser: 0.87 mg/dL (ref 0.61–1.24)
GFR, Estimated: >60 mL/min (ref >60)
Glucose, Bld: 169 mg/dL — ABNORMAL HIGH (ref 70–99)
Potassium: 4.5 mmol/L (ref 3.5–5.1)
Sodium: 133 mmol/L — ABNORMAL LOW (ref 135–145)
Total Bilirubin: 0.7 mg/dL (ref 0.0–1.2)
Total Protein: 7.8 g/dL (ref 6.5–8.1)

## 2023-10-19 LAB — SURGICAL PCR SCREEN
MRSA, PCR: NEGATIVE
Staphylococcus aureus: NEGATIVE

## 2023-10-19 LAB — CBC
HCT: 37.9 % — ABNORMAL LOW (ref 39.0–52.0)
Hemoglobin: 12.3 g/dL — ABNORMAL LOW (ref 13.0–17.0)
MCH: 32.8 pg (ref 26.0–34.0)
MCHC: 32.5 g/dL (ref 30.0–36.0)
MCV: 101.1 fL — ABNORMAL HIGH (ref 80.0–100.0)
Platelets: 112 10*3/uL — ABNORMAL LOW (ref 150–400)
RBC: 3.75 MIL/uL — ABNORMAL LOW (ref 4.22–5.81)
RDW: 14 % (ref 11.5–15.5)
WBC: 4.9 10*3/uL (ref 4.0–10.5)
nRBC: 0 % (ref 0.0–0.2)

## 2023-10-19 LAB — PROTIME-INR
INR: 1.7 — ABNORMAL HIGH (ref 0.8–1.2)
Prothrombin Time: 20.1 s — ABNORMAL HIGH (ref 11.4–15.2)

## 2023-10-19 LAB — TYPE AND SCREEN
ABO/RH(D): O NEG
Antibody Screen: NEGATIVE

## 2023-10-19 NOTE — Progress Notes (Signed)
 Patient signed all consents at PAT lab appointment. CHG soap and instructions were given to patient. CHG surgical prep reviewed with patient and all questions answered.  Patients chart send to anesthesia for review. Pt denies any respiratory illness/infection in the last two months.

## 2023-10-22 MED ORDER — DEXMEDETOMIDINE HCL IN NACL 400 MCG/100ML IV SOLN
0.1000 ug/kg/h | INTRAVENOUS | Status: AC
  Administered 2023-10-23: 1 ug/kg/h via INTRAVENOUS
  Filled 2023-10-22: qty 100

## 2023-10-22 MED ORDER — CEFAZOLIN SODIUM-DEXTROSE 2-4 GM/100ML-% IV SOLN
2.0000 g | INTRAVENOUS | Status: AC
  Administered 2023-10-23: 2 g via INTRAVENOUS
  Filled 2023-10-22 (×2): qty 100

## 2023-10-22 MED ORDER — MAGNESIUM SULFATE 50 % IJ SOLN
40.0000 meq | INTRAMUSCULAR | Status: DC
  Filled 2023-10-22 (×2): qty 9.85

## 2023-10-22 MED ORDER — HEPARIN 30,000 UNITS/1000 ML (OHS) CELLSAVER SOLUTION
Status: DC
  Filled 2023-10-22 (×2): qty 1000

## 2023-10-22 MED ORDER — POTASSIUM CHLORIDE 2 MEQ/ML IV SOLN
80.0000 meq | INTRAVENOUS | Status: DC
  Filled 2023-10-22 (×2): qty 40

## 2023-10-22 MED ORDER — NOREPINEPHRINE 4 MG/250ML-% IV SOLN
0.0000 ug/min | INTRAVENOUS | Status: AC
Start: 2023-10-23 — End: 2023-10-24
  Administered 2023-10-23: 3 ug/min via INTRAVENOUS
  Filled 2023-10-22: qty 250

## 2023-10-22 NOTE — H&P (Signed)
 [address]       [place] [address]             [phone]                                   JAJA SWITALSKI [facility] Medical Record #[id number] Date of Birth: [birth year]   Henry Holland., MD   Chief Complaint:  increasing fatigue    History of Present Illness:     Pt is an 89 yo male who over the past year has felt he hasn't the same energy he had in the past with increasing DOE. He has no syncope or cp. Pt works out several days a week at [employer] and only issue is more fatigued. He plays golf. He was found on echo to have severy LFLG AS with a mean gradient of and a valve area of 0.76 cm2 with a DVI of 0.24. He has normal lf function. He underwent cath without significant CAD and CTA which has acceptable anatomy for TAVR. Pt has also history of afib on coumadin and prostate cancer             Past Medical History:  Diagnosis Date   Arthritis     Chronic atrial fibrillation (HCC)     History of radiation therapy     Hyperlipidemia     Hypertension     Prostate cancer (HCC)     Severe aortic stenosis                 Past Surgical History:  Procedure Laterality Date   CATARACT EXTRACTION, BILATERAL       HERNIA REPAIR        X2   RIGHT HEART CATH AND CORONARY ANGIOGRAPHY N/A 09/19/2023    Procedure: RIGHT HEART CATH AND CORONARY ANGIOGRAPHY;  Surgeon: Kathleene Hazel, MD;  Location: [facility];  Service: Cardiovascular;  Laterality: N/A;          Tobacco Use History  Social History       Tobacco Use  Smoking Status Never  Smokeless Tobacco Never      Social History       Substance and Sexual Activity  Alcohol Use No      Social History         Socioeconomic History   Marital status: Married      Spouse name: Not on file   Number of children: 1   Years of education: Not on file   Highest education level: Not on file  Occupational History   Occupation: [employer]  Physicist, medical: [employer]   Occupation: Retired from [employer]  Tobacco Use   Smoking status: Never   Smokeless tobacco: Never  Vaping Use   Vaping status: Never Used  Substance and Sexual Activity   Alcohol use: No   Drug use: No   Sexual activity: Not on file  Other Topics Concern   Not on file  Social History Narrative   Not on file    Social Drivers of Health    Financial Resource Strain: Not on file  Food Insecurity: Not on file  Transportation Needs: Not on file  Physical Activity: Not on file  Stress: Not on file  Social Connections: Not on file  Intimate Partner Violence: Not on  file      Allergies       Allergies  Allergen Reactions   Atorvastatin Other (See Comments)      Muscle aches and pain   Ezetimibe Other (See Comments)      Muscle aches and pain   Fenofibrate Other (See Comments)      Muscle aches and pain              Current Outpatient Medications  Medication Sig Dispense Refill   ascorbic acid (VITAMIN C) 500 MG tablet Take 500 mg by mouth daily.       Cholecalciferol (VITAMIN D3) 1000 units CAPS Take 1,000 Units by mouth daily.       diltiazem (CARDIZEM CD) 120 MG 24 hr capsule Take 1 capsule (120 mg total) by mouth daily. 90 capsule 3   glucose blood (ONETOUCH VERIO) test strip Use to check blood sugar 2 times a day as directed Dx E11.4       JANTOVEN 10 MG tablet Take 10 mg by mouth every Monday, Wednesday, and Friday. In the morning       JANTOVEN 2.5 MG tablet Take 2.5 mg by mouth See admin instructions. Take with 5 mg for a total of 7.5 mg on Tues., Thurs., Sat., and Sunday in the morning       Lancets (ONETOUCH ULTRASOFT) lancets use for OneTouch Ultra meter to test up to bid       losartan (COZAAR) 100 MG tablet Take 100 mg by mouth daily.         metFORMIN (GLUCOPHAGE-XR) 500 MG 24 hr tablet Take 500 mg by mouth 2 (two) times daily with a meal.       prednisoLONE acetate (PRED FORTE) 1 % ophthalmic suspension Place 1  drop into the left eye 4 (four) times daily.       rosuvastatin (CRESTOR) 5 MG tablet Take 5 mg by mouth daily.         warfarin (JANTOVEN) 5 MG tablet Take 5 mg by mouth See admin instructions. Take with 2.5 mg for a total of 7.5 mg on Tues., Thurs., Sat., and Sun. In the morning          No current facility-administered medications for this visit.               Family History  Problem Relation Age of Onset   Stroke Mother 86                Physical Exam: Appears very well for age Lungs: clear Card: IRR with 3/6 sem Ext: warm Neuro: intact         Diagnostic Studies & Laboratory data: I have personally reviewed the following studies and agree with the findings   TTE (06/2023) IMPRESSIONS     1. Calcified aortic valve with severe low flow low gradient AS (mean  gradient 26 mmHg; AVA 0.7 cm2 and DI 0.24).   2. Left ventricular ejection fraction, by estimation, is 60 to 65%. The  left ventricle has normal function. The left ventricle has no regional  wall motion abnormalities. There is mild left ventricular hypertrophy.  Left ventricular diastolic parameters  are indeterminate. Elevated left atrial pressure.   3. Right ventricular systolic function is normal. The right ventricular  size is mildly enlarged. There is moderately elevated pulmonary artery  systolic pressure.   4. Left atrial size was severely dilated.   5. Right atrial size was severely dilated.   6. The mitral valve  is normal in structure. Mild to moderate mitral valve  regurgitation. No evidence of mitral stenosis. Moderate mitral annular  calcification.   7. The aortic valve is tricuspid. Aortic valve regurgitation is not  visualized. Severe aortic valve stenosis.   8. The inferior vena cava is dilated in size with <50% respiratory  variability, suggesting right atrial pressure of 15 mmHg.   FINDINGS   Left Ventricle: Left ventricular ejection fraction, by estimation, is 60  to 65%. The left  ventricle has normal function. The left ventricle has no  regional wall motion abnormalities. The left ventricular internal cavity  size was normal in size. There is   mild left ventricular hypertrophy. Left ventricular diastolic parameters  are indeterminate. Elevated left atrial pressure.   Right Ventricle: The right ventricular size is mildly enlarged. No  increase in right ventricular wall thickness. Right ventricular systolic  function is normal. There is moderately elevated pulmonary artery systolic  pressure. The tricuspid regurgitant  velocity is 3.06 m/s, and with an assumed right atrial pressure of 15  mmHg, the estimated right ventricular systolic pressure is 52.5 mmHg.   Left Atrium: Left atrial size was severely dilated.   Right Atrium: Right atrial size was severely dilated.   Pericardium: There is no evidence of pericardial effusion.   Mitral Valve: The mitral valve is normal in structure. Moderate mitral  annular calcification. Mild to moderate mitral valve regurgitation. No  evidence of mitral valve stenosis.   Tricuspid Valve: The tricuspid valve is normal in structure. Tricuspid  valve regurgitation is mild . No evidence of tricuspid stenosis.   Aortic Valve: The aortic valve is tricuspid. Aortic valve regurgitation is  not visualized. Severe aortic stenosis is present. Aortic valve mean  gradient measures 26.0 mmHg. Aortic valve peak gradient measures 46.8  mmHg. Aortic valve area, by VTI measures   0.76 cm.   Pulmonic Valve: The pulmonic valve was normal in structure. Pulmonic valve  regurgitation is trivial. No evidence of pulmonic stenosis.   Aorta: The aortic root is normal in size and structure.   Venous: The inferior vena cava is dilated in size with less than 50%  respiratory variability, suggesting right atrial pressure of 15 mmHg.   IAS/Shunts: No atrial level shunt detected by color flow Doppler.   Additional Comments: Calcified aortic valve  with severe low flow low  gradient AS (mean gradient 26 mmHg; AVA 0.7 cm2 and DI 0.24).     LEFT VENTRICLE  PLAX 2D  LVIDd:         4.91 cm   Diastology  LVIDs:         3.42 cm   LV e' medial:    4.43 cm/s  LV PW:         1.17 cm   LV E/e' medial:  40.0  LV IVS:        1.32 cm   LV e' lateral:   8.33 cm/s  LVOT diam:     2.00 cm   LV E/e' lateral: 21.2  LV SV:         48  LV SV Index:   24  LVOT Area:     3.14 cm                             3D Volume EF:  3D EF:        53 %                           LV EDV:       139 ml                           LV ESV:       65 ml                           LV SV:        74 ml   RIGHT VENTRICLE  RV Basal diam:  4.42 cm  RV Mid diam:    3.03 cm  RV S prime:     12.30 cm/s  TAPSE (M-mode): 2.0 cm   LEFT ATRIUM              Index        RIGHT ATRIUM           Index  LA diam:        5.50 cm  2.72 cm/m   RA Area:     33.60 cm  LA Vol (A2C):   150.0 ml 74.22 ml/m  RA Volume:   136.00 ml 67.29 ml/m  LA Vol (A4C):   178.0 ml 88.07 ml/m  LA Biplane Vol: 165.0 ml 81.64 ml/m   AORTIC VALVE  AV Area (Vmax):    0.71 cm  AV Area (Vmean):   0.70 cm  AV Area (VTI):     0.76 cm  AV Vmax:           342.00 cm/s  AV Vmean:          233.000 cm/s  AV VTI:            0.629 m  AV Peak Grad:      46.8 mmHg  AV Mean Grad:      26.0 mmHg  LVOT Vmax:         76.80 cm/s  LVOT Vmean:        51.800 cm/s  LVOT VTI:          0.152 m  LVOT/AV VTI ratio: 0.24    AORTA  Ao Root diam: 3.40 cm  Ao Asc diam:  3.30 cm   MITRAL VALVE                 TRICUSPID VALVE  MV Area (PHT): 4.06 cm      TR Peak grad:   37.5 mmHg  MV Decel Time: 187 msec      TR Vmax:        306.00 cm/s  MR Peak grad:   116.2 mmHg  MR Mean grad:   83.0 mmHg    SHUNTS  MR Vmax:        539.00 cm/s  Systemic VTI:  0.15 m  MR Vmean:       438.0 cm/s   Systemic Diam: 2.00 cm  MR PISA:        1.57 cm  MR PISA Radius: 0.50 cm  MV E velocity: 177.00 cm/s    CATH  (08/2023) Coronary Findings   Diagnostic Dominance: Right Left Anterior Descending  Vessel is large.  Mid LAD to Dist LAD lesion is 30% stenosed.    Left Circumflex  Vessel is large.    Right Coronary Artery  Vessel is large.  Prox RCA to Mid RCA lesion is 20% stenosed.       Recent Radiology Findings:    Imaging Results (Last 48 hours)  CT CORONARY MORPH W/CTA COR W/SCORE W/CA W/CM &/OR WO/CM Result Date: 10/02/2023 CLINICAL DATA:  Severe Aortic Stenosis. EXAM: Cardiac TAVR CT TECHNIQUE: A non-contrast, gated CT scan was obtained with axial slices of 3 mm through the heart for aortic valve calcium scoring. A 100 kV retrospective, gated, contrast cardiac scan was obtained. Gantry rotation speed was 250 msecs and collimation was 0.6 mm. Nitroglycerin was not given. The 3D data set was reconstructed in 5% intervals of the 0-95% of the R-R cycle. Systolic and diastolic phases were analyzed on a dedicated workstation using MPR, MIP, and VRT modes. The patient received 100 cc of contrast. FINDINGS: Image quality: Excellent. Noise artifact is: Limited. Valve Morphology: Tricuspid aortic valve with diffuse, severe calcifications. Severely restricted leaflet motion in systole. Aortic Valve Calcium score: 1694 Aortic annular dimension: Phase assessed: 25% Annular area: 458 mm2 Annular perimeter: 77.3 mm Max diameter: 26.9 mm Min diameter: 21.3 mm Annular and subannular calcification: Mild, single calcification under the LCC. Membranous septum length: 5.5 mm Optimal coplanar projection: LAO 10 CRA 4 Coronary Artery Height above Annulus: Left Main: 12.2 mm Right Coronary: 12.4 mm Sinus of Valsalva Measurements: Non-coronary: 31 mm Right-coronary: 31 mm Left-coronary: 33 mm Sinus of Valsalva Height: Non-coronary: 23.6 mm Right-coronary: 20.8 mm Left-coronary: 22.5 mm Sinotubular Junction: 30 mm Ascending Thoracic Aorta: 33 mm Coronary Arteries: Normal coronary origin. Right dominance. The study was performed  without use of NTG and is insufficient for plaque evaluation. Please refer to recent cardiac catheterization for coronary assessment. 3-vessel coronary calcium. Cardiac Morphology: Right Atrium: Right atrial size is severely dilated. Right Ventricle: The right ventricular cavity is dilated. Left Atrium: Left atrial size is severely dilated. Filling defect in the LAA concerning for thrombus. Left Ventricle: The ventricular cavity size is within normal limits. Pulmonary arteries: Normal in size without proximal filling defect. Pulmonary veins: Normal pulmonary venous drainage. Pericardium: Normal thickness with no significant effusion or calcium present. Mitral Valve: The mitral valve is degenerative with severe annular calcium. Extra-cardiac findings: See attached radiology report for non-cardiac structures. IMPRESSION: 1. Annular measurements support a 26 mm S3 or 29 mm Evolut Pro. 2. Mild, single annular calcification under the LCC. 3. Sufficient coronary to annulus distance. 4. Optimal Fluoroscopic Angle for Delivery: LAO 10 CRA 4 5. Filling defect in the LAA concerning for thrombus (does not clear on the CTA runoff images). 6. Severe biatrial enlargement. 7. The mitral valve is degenerative with severe annular calcium. Gerri Spore T. Flora Lipps, MD Electronically Signed   By: Lennie Odor M.D.   On: 10/02/2023 22:38         Recent Lab Findings: Recent Labs       Lab Results  Component Value Date    WBC 5.4 09/19/2023    HGB 10.9 (L) 09/19/2023    HCT 32.0 (L) 09/19/2023    PLT 139 (L) 09/19/2023    GLUCOSE 89 09/12/2023    CHOL 125 01/02/2011    TRIG 121.0 01/02/2011    HDL 37.40 (L) 01/02/2011    LDLCALC 63 01/02/2011    ALT 28 01/02/2011    AST 22 01/02/2011    NA 137 09/19/2023    K 4.1 09/19/2023    CL 99 09/12/2023    CREATININE 0.76 09/12/2023    BUN 14 09/12/2023    CO2 19 (  L) 09/12/2023    TSH 1.38 01/02/2011    INR 1.1 09/19/2023            Assessment / Plan:     89 yo male  with NYHA class 2 symptoms of severe LFLG AS with normal LV function and no CAD. Pt has a class I indication for AVR and with his age and comorbidities would best be served with TAVR. He understands all the risks goals and recovery from TAVR and wishes to proceed. He is not a bail out candidate. He will have femoral access and a 26mm sapien valve

## 2023-10-23 ENCOUNTER — Inpatient Hospital Stay (HOSPITAL_COMMUNITY): Payer: Medicare HMO

## 2023-10-23 ENCOUNTER — Other Ambulatory Visit: Payer: Self-pay

## 2023-10-23 ENCOUNTER — Encounter (HOSPITAL_COMMUNITY): Payer: Self-pay | Admitting: Cardiovascular Disease

## 2023-10-23 ENCOUNTER — Inpatient Hospital Stay (HOSPITAL_COMMUNITY): Payer: Self-pay | Admitting: Certified Registered Nurse Anesthetist

## 2023-10-23 ENCOUNTER — Other Ambulatory Visit: Payer: Self-pay | Admitting: Physician Assistant

## 2023-10-23 ENCOUNTER — Encounter (HOSPITAL_COMMUNITY)
Admission: RE | Disposition: A | Discharge status: Home / Self Care | Payer: Self-pay | Source: Home / Self Care | Attending: Cardiovascular Disease

## 2023-10-23 ENCOUNTER — Inpatient Hospital Stay (HOSPITAL_COMMUNITY): Payer: Self-pay | Admitting: Physician Assistant

## 2023-10-23 ENCOUNTER — Inpatient Hospital Stay (HOSPITAL_COMMUNITY)
Admission: RE | Admit: 2023-10-23 | Discharge: 2023-10-24 | DRG: 266 | Disposition: A | Discharge status: Home / Self Care | Payer: Medicare HMO | Attending: Cardiovascular Disease | Admitting: Cardiovascular Disease

## 2023-10-23 DIAGNOSIS — I482 Chronic atrial fibrillation, unspecified: Secondary | ICD-10-CM | POA: Diagnosis present

## 2023-10-23 DIAGNOSIS — I2510 Atherosclerotic heart disease of native coronary artery without angina pectoris: Secondary | ICD-10-CM | POA: Diagnosis present

## 2023-10-23 DIAGNOSIS — E041 Nontoxic single thyroid nodule: Secondary | ICD-10-CM | POA: Diagnosis not present

## 2023-10-23 DIAGNOSIS — Z923 Personal history of irradiation: Secondary | ICD-10-CM

## 2023-10-23 DIAGNOSIS — E785 Hyperlipidemia, unspecified: Secondary | ICD-10-CM | POA: Diagnosis present

## 2023-10-23 DIAGNOSIS — Z7901 Long term (current) use of anticoagulants: Secondary | ICD-10-CM

## 2023-10-23 DIAGNOSIS — Z952 Presence of prosthetic heart valve: Secondary | ICD-10-CM

## 2023-10-23 DIAGNOSIS — Z79899 Other long term (current) drug therapy: Secondary | ICD-10-CM | POA: Diagnosis not present

## 2023-10-23 DIAGNOSIS — I10 Essential (primary) hypertension: Secondary | ICD-10-CM | POA: Diagnosis not present

## 2023-10-23 DIAGNOSIS — M1990 Unspecified osteoarthritis, unspecified site: Secondary | ICD-10-CM | POA: Diagnosis not present

## 2023-10-23 DIAGNOSIS — I110 Hypertensive heart disease with heart failure: Secondary | ICD-10-CM | POA: Diagnosis not present

## 2023-10-23 DIAGNOSIS — I4819 Other persistent atrial fibrillation: Secondary | ICD-10-CM | POA: Diagnosis present

## 2023-10-23 DIAGNOSIS — I272 Pulmonary hypertension, unspecified: Secondary | ICD-10-CM | POA: Diagnosis not present

## 2023-10-23 DIAGNOSIS — Z823 Family history of stroke: Secondary | ICD-10-CM | POA: Diagnosis not present

## 2023-10-23 DIAGNOSIS — I340 Nonrheumatic mitral (valve) insufficiency: Secondary | ICD-10-CM | POA: Insufficient documentation

## 2023-10-23 DIAGNOSIS — I5031 Acute diastolic (congestive) heart failure: Secondary | ICD-10-CM | POA: Diagnosis not present

## 2023-10-23 DIAGNOSIS — Z888 Allergy status to other drugs, medicaments and biological substances status: Secondary | ICD-10-CM | POA: Diagnosis not present

## 2023-10-23 DIAGNOSIS — I350 Nonrheumatic aortic (valve) stenosis: Secondary | ICD-10-CM

## 2023-10-23 DIAGNOSIS — E119 Type 2 diabetes mellitus without complications: Secondary | ICD-10-CM | POA: Diagnosis not present

## 2023-10-23 DIAGNOSIS — D6959 Other secondary thrombocytopenia: Secondary | ICD-10-CM | POA: Diagnosis present

## 2023-10-23 DIAGNOSIS — E1142 Type 2 diabetes mellitus with diabetic polyneuropathy: Secondary | ICD-10-CM | POA: Diagnosis not present

## 2023-10-23 DIAGNOSIS — I080 Rheumatic disorders of both mitral and aortic valves: Principal | ICD-10-CM | POA: Diagnosis present

## 2023-10-23 DIAGNOSIS — Z7984 Long term (current) use of oral hypoglycemic drugs: Secondary | ICD-10-CM | POA: Diagnosis not present

## 2023-10-23 DIAGNOSIS — Z8546 Personal history of malignant neoplasm of prostate: Secondary | ICD-10-CM | POA: Diagnosis not present

## 2023-10-23 DIAGNOSIS — Z006 Encounter for examination for normal comparison and control in clinical research program: Secondary | ICD-10-CM

## 2023-10-23 DIAGNOSIS — I4891 Unspecified atrial fibrillation: Secondary | ICD-10-CM | POA: Diagnosis not present

## 2023-10-23 DIAGNOSIS — D696 Thrombocytopenia, unspecified: Secondary | ICD-10-CM | POA: Diagnosis present

## 2023-10-23 HISTORY — PX: INTRAOPERATIVE TRANSTHORACIC ECHOCARDIOGRAM: SHX6523

## 2023-10-23 LAB — POCT I-STAT, CHEM 8
BUN: 12 mg/dL (ref 8–23)
Calcium, Ion: 1.28 mmol/L (ref 1.15–1.40)
Chloride: 103 mmol/L (ref 98–111)
Creatinine, Ser: 0.6 mg/dL — ABNORMAL LOW (ref 0.61–1.24)
Glucose, Bld: 167 mg/dL — ABNORMAL HIGH (ref 70–99)
HCT: 31 % — ABNORMAL LOW (ref 39.0–52.0)
Hemoglobin: 10.5 g/dL — ABNORMAL LOW (ref 13.0–17.0)
Potassium: 4 mmol/L (ref 3.5–5.1)
Sodium: 138 mmol/L (ref 135–145)
TCO2: 24 mmol/L (ref 22–32)

## 2023-10-23 LAB — GLUCOSE, CAPILLARY
Glucose-Capillary: 154 mg/dL — ABNORMAL HIGH (ref 70–99)
Glucose-Capillary: 127 mg/dL — ABNORMAL HIGH (ref 70–99)
Glucose-Capillary: 124 mg/dL — ABNORMAL HIGH (ref 70–99)
Glucose-Capillary: 188 mg/dL — ABNORMAL HIGH (ref 70–99)

## 2023-10-23 LAB — ECHOCARDIOGRAM LIMITED
AR max vel: 0.97 cm2
AV Area VTI: 1.08 cm2
AV Area mean vel: 1.08 cm2
AV Mean grad: 15.5 mm[Hg]
AV Peak grad: 29.4 mm[Hg]
Ao pk vel: 2.71 m/s
MV VTI: 1.71 cm2
S' Lateral: 4.2 cm

## 2023-10-23 LAB — POCT ACTIVATED CLOTTING TIME: Activated Clotting Time: 262 s

## 2023-10-23 LAB — PROTIME-INR
INR: 1.1 (ref 0.8–1.2)
Prothrombin Time: 14.1 s (ref 11.4–15.2)

## 2023-10-23 LAB — ABO/RH: ABO/RH(D): O NEG

## 2023-10-23 SURGERY — TRANSCATHETER AORTIC VALVE REPLACEMENT, TRANSFEMORAL (CATHLAB)
Anesthesia: Monitor Anesthesia Care

## 2023-10-23 MED ORDER — CHLORHEXIDINE GLUCONATE 4 % EX SOLN: 60.0000 mL | Freq: Once | CUTANEOUS | Status: DC

## 2023-10-23 MED ORDER — CHLORHEXIDINE GLUCONATE 4 % EX SOLN: 30.0000 mL | CUTANEOUS | Status: DC

## 2023-10-23 MED ORDER — LIDOCAINE HCL (PF) 1 % IJ SOLN
INTRAMUSCULAR | Status: DC | PRN
  Administered 2023-10-23 (×2): 5 mL

## 2023-10-23 MED ORDER — CEFAZOLIN SODIUM-DEXTROSE 2-4 GM/100ML-% IV SOLN
2.0000 g | Freq: Three times a day (TID) | INTRAVENOUS | Status: AC
  Administered 2023-10-23 – 2023-10-24 (×2): 2 g via INTRAVENOUS
  Filled 2023-10-23 (×2): qty 100

## 2023-10-23 MED ORDER — ROSUVASTATIN CALCIUM 5 MG PO TABS
5.0000 mg | ORAL_TABLET | Freq: Every day | ORAL | Status: DC
  Administered 2023-10-23 – 2023-10-24 (×2): 5 mg via ORAL
  Filled 2023-10-23 (×2): qty 1

## 2023-10-23 MED ORDER — MORPHINE SULFATE (PF) 2 MG/ML IV SOLN: 1.0000 mg | INTRAVENOUS | Status: DC | PRN

## 2023-10-23 MED ORDER — NITROGLYCERIN IN D5W 200-5 MCG/ML-% IV SOLN: 0.0000 ug/min | INTRAVENOUS | Status: DC

## 2023-10-23 MED ORDER — SODIUM CHLORIDE 0.9% FLUSH
3.0000 mL | Freq: Two times a day (BID) | INTRAVENOUS | Status: DC
  Administered 2023-10-23 – 2023-10-24 (×2): 3 mL via INTRAVENOUS

## 2023-10-23 MED ORDER — SODIUM CHLORIDE 0.9 % IV SOLN: INTRAVENOUS | Status: AC

## 2023-10-23 MED ORDER — FUROSEMIDE 10 MG/ML IJ SOLN
20.0000 mg | Freq: Once | INTRAMUSCULAR | Status: AC
  Administered 2023-10-23: 20 mg via INTRAVENOUS
  Filled 2023-10-23: qty 2

## 2023-10-23 MED ORDER — HEPARIN (PORCINE) IN NACL 1000-0.9 UT/500ML-% IV SOLN
INTRAVENOUS | Status: DC | PRN
  Administered 2023-10-23: 1000 mL

## 2023-10-23 MED ORDER — OXYCODONE HCL 5 MG PO TABS: 5.0000 mg | ORAL_TABLET | ORAL | Status: DC | PRN

## 2023-10-23 MED ORDER — ACETAMINOPHEN 325 MG PO TABS: 650.0000 mg | ORAL_TABLET | Freq: Four times a day (QID) | ORAL | Status: DC | PRN

## 2023-10-23 MED ORDER — PROPOFOL 500 MG/50ML IV EMUL
INTRAVENOUS | Status: DC | PRN
  Administered 2023-10-23: 20 ug/kg/min via INTRAVENOUS

## 2023-10-23 MED ORDER — POTASSIUM CHLORIDE CRYS ER 10 MEQ PO TBCR
10.0000 meq | EXTENDED_RELEASE_TABLET | Freq: Once | ORAL | Status: AC
  Administered 2023-10-23: 10 meq via ORAL
  Filled 2023-10-23: qty 1

## 2023-10-23 MED ORDER — TRAMADOL HCL 50 MG PO TABS: 50.0000 mg | ORAL_TABLET | ORAL | Status: DC | PRN

## 2023-10-23 MED ORDER — HEPARIN SODIUM (PORCINE) 1000 UNIT/ML IJ SOLN
INTRAMUSCULAR | Status: DC | PRN
  Administered 2023-10-23: 11000 [IU] via INTRAVENOUS

## 2023-10-23 MED ORDER — CHLORHEXIDINE GLUCONATE 0.12 % MT SOLN
15.0000 mL | Freq: Once | OROMUCOSAL | Status: AC
  Administered 2023-10-23: 15 mL via OROMUCOSAL
  Filled 2023-10-23: qty 15

## 2023-10-23 MED ORDER — FENTANYL CITRATE (PF) 100 MCG/2ML IJ SOLN
INTRAMUSCULAR | Status: AC
  Filled 2023-10-23: qty 2

## 2023-10-23 MED ORDER — IODIXANOL 320 MG/ML IV SOLN
INTRAVENOUS | Status: DC | PRN
  Administered 2023-10-23: 40 mL

## 2023-10-23 MED ORDER — SODIUM CHLORIDE 0.9 % IV SOLN
INTRAVENOUS | Status: DC
Start: 2023-10-24 — End: 2023-10-23

## 2023-10-23 MED ORDER — ACETAMINOPHEN 650 MG RE SUPP: 650.0000 mg | Freq: Four times a day (QID) | RECTAL | Status: DC | PRN

## 2023-10-23 MED ORDER — FENTANYL CITRATE (PF) 100 MCG/2ML IJ SOLN
INTRAMUSCULAR | Status: DC | PRN
  Administered 2023-10-23 (×4): 25 ug via INTRAVENOUS

## 2023-10-23 MED ORDER — SODIUM CHLORIDE 0.9% FLUSH: 3.0000 mL | INTRAVENOUS | Status: DC | PRN

## 2023-10-23 MED ORDER — ONDANSETRON HCL 4 MG/2ML IJ SOLN: 4.0000 mg | Freq: Four times a day (QID) | INTRAMUSCULAR | Status: DC | PRN

## 2023-10-23 MED ORDER — INSULIN ASPART 100 UNIT/ML IJ SOLN
0.0000 [IU] | Freq: Three times a day (TID) | INTRAMUSCULAR | Status: DC
  Administered 2023-10-23: 4 [IU] via SUBCUTANEOUS

## 2023-10-23 MED ORDER — LIDOCAINE HCL (PF) 1 % IJ SOLN
INTRAMUSCULAR | Status: AC
  Filled 2023-10-23: qty 30

## 2023-10-23 MED ORDER — SODIUM CHLORIDE 0.9 % IV SOLN: 250.0000 mL | INTRAVENOUS | Status: DC | PRN

## 2023-10-23 MED ORDER — PROTAMINE SULFATE 10 MG/ML IV SOLN
INTRAVENOUS | Status: DC | PRN
  Administered 2023-10-23: 110 mg via INTRAVENOUS

## 2023-10-23 SURGICAL SUPPLY — 28 items
BAG SNAP BAND KOVER 36X36 (MISCELLANEOUS) ×4 IMPLANT
CABLE ADAPT PACING TEMP 12FT (ADAPTER) IMPLANT
CATH 26 ULTRA DELIVERY (CATHETERS) IMPLANT
CATH DIAG 6FR PIGTAIL ANGLED (CATHETERS) IMPLANT
CATH INFINITI 5FR ANG PIGTAIL (CATHETERS) IMPLANT
CATH INFINITI 6F AL2 (CATHETERS) IMPLANT
CATH S G BIP PACING (CATHETERS) IMPLANT
CLOSURE MYNX CONTROL 6F/7F (Vascular Products) IMPLANT
CLOSURE PERCLOSE PROSTYLE (VASCULAR PRODUCTS) IMPLANT
CRIMPER (MISCELLANEOUS) IMPLANT
DEVICE INFLATION ATRION QL2530 (MISCELLANEOUS) IMPLANT
KIT MICROPUNCTURE NIT STIFF (SHEATH) IMPLANT
KIT SAPIAN 3 ULTRA RESILIA 26 (Valve) IMPLANT
PACK CARDIAC CATHETERIZATION (CUSTOM PROCEDURE TRAY) ×2 IMPLANT
SET ATX-X65L (MISCELLANEOUS) IMPLANT
SHEATH BRITE TIP 6FR 35CM (SHEATH) IMPLANT
SHEATH BRITE TIP 7FR 35CM (SHEATH) IMPLANT
SHEATH INTRODUCER SET 20-26 (SHEATH) IMPLANT
SHEATH PINNACLE 6F 10CM (SHEATH) IMPLANT
SHEATH PINNACLE 8F 10CM (SHEATH) IMPLANT
SHEATH PROBE COVER 6X72 (BAG) IMPLANT
SHIELD CATH-GARD CONTAMINATION (MISCELLANEOUS) IMPLANT
STOPCOCK MORSE 400PSI 3WAY (MISCELLANEOUS) ×4 IMPLANT
WIRE AMPLATZ SS-J .035X180CM (WIRE) IMPLANT
WIRE EMERALD 3MM-J .035X150CM (WIRE) IMPLANT
WIRE EMERALD 3MM-J .035X260CM (WIRE) IMPLANT
WIRE EMERALD ST .035X260CM (WIRE) IMPLANT
WIRE SAFARI SM CURVE 275 (WIRE) IMPLANT

## 2023-10-23 NOTE — Progress Notes (Addendum)
 Patient arrived to 95E09 post TAVR. Right and left groin sites level 0.  Patient A&O x4.  Telemetry monitor applied and CCMD notified.  CHG bath and skin assessment completed. Patient oriented to room and unit to include call light and phone.  Patient educated with regard to bedrest x4 hours and frequent BP checks.

## 2023-10-23 NOTE — Anesthesia Postprocedure Evaluation (Signed)
 Anesthesia Post Note  Patient: SELIG WAMPOLE  Procedure(s) Performed: Transcatheter Aortic Valve Replacement, Transfemoral INTRAOPERATIVE TRANSTHORACIC ECHOCARDIOGRAM     Patient location during evaluation: PACU Anesthesia Type: MAC Level of consciousness: awake and alert Pain management: pain level controlled Vital Signs Assessment: post-procedure vital signs reviewed and stable Respiratory status: spontaneous breathing, nonlabored ventilation, respiratory function stable and patient connected to nasal cannula oxygen Cardiovascular status: stable and blood pressure returned to baseline Postop Assessment: no apparent nausea or vomiting Anesthetic complications: no  There were no known notable events for this encounter.  Last Vitals:  Vitals:   10/23/23 1600 10/23/23 1630  BP: 112/75 117/68  Pulse: 68 62  Resp: 13 16  Temp:    SpO2: 100% 100%    Last Pain:  Vitals:   10/23/23 1448  TempSrc: Oral  PainSc: 0-No pain                 [name]

## 2023-10-23 NOTE — Interval H&P Note (Signed)
 History and Physical Interval Note:  10/23/2023 9:23 AM  Henry Holland  has presented today for surgery, with the diagnosis of Severe Aortic Stenosis.  The various methods of treatment have been discussed with the patient and family. After consideration of risks, benefits and other options for treatment, the patient has consented to  Procedure(s): Transcatheter Aortic Valve Replacement, Transfemoral (N/A) INTRAOPERATIVE TRANSTHORACIC ECHOCARDIOGRAM (N/A) as a surgical intervention.  The patient's history has been reviewed, patient examined, no change in status, stable for surgery.  I have reviewed the patient's chart and labs.  Questions were answered to the patient's satisfaction.     Eugenio Hoes

## 2023-10-23 NOTE — Anesthesia Preprocedure Evaluation (Addendum)
 Anesthesia Evaluation  Patient identified by MRN, date of birth, ID band Patient awake    Reviewed: Allergy & Precautions, NPO status , Patient's Chart, lab work & pertinent test results  Airway Mallampati: II  TM Distance: >3 FB Neck ROM: Full    Dental  (+) Teeth Intact, Dental Advisory Given   Pulmonary neg pulmonary ROS   breath sounds clear to auscultation       Cardiovascular hypertension, Pt. on medications + dysrhythmias Atrial Fibrillation + Valvular Problems/Murmurs AS  Rhythm:Regular Rate:Normal + Systolic murmurs Echo:  1. Calcified aortic valve with severe low flow low gradient AS (mean  gradient 26 mmHg; AVA 0.7 cm2 and DI 0.24).   2. Left ventricular ejection fraction, by estimation, is 60 to 65%. The  left ventricle has normal function. The left ventricle has no regional  wall motion abnormalities. There is mild left ventricular hypertrophy.  Left ventricular diastolic parameters  are indeterminate. Elevated left atrial pressure.   3. Right ventricular systolic function is normal. The right ventricular  size is mildly enlarged. There is moderately elevated pulmonary artery  systolic pressure.   4. Left atrial size was severely dilated.   5. Right atrial size was severely dilated.   6. The mitral valve is normal in structure. Mild to moderate mitral valve  regurgitation. No evidence of mitral stenosis. Moderate mitral annular  calcification.   7. The aortic valve is tricuspid. Aortic valve regurgitation is not  visualized. Severe aortic valve stenosis.   8. The inferior vena cava is dilated in size with <50% respiratory  variability, suggesting right atrial pressure of 15 mmHg.     Neuro/Psych  Neuromuscular disease  negative psych ROS   GI/Hepatic negative GI ROS, Neg liver ROS,,,  Endo/Other  diabetes, Type 2, Oral Hypoglycemic Agents    Renal/GU Renal disease     Musculoskeletal  (+) Arthritis ,     Abdominal   Peds  Hematology negative hematology ROS (+)   Anesthesia Other Findings   Reproductive/Obstetrics                             Anesthesia Physical Anesthesia Plan  ASA: 4  Anesthesia Plan: MAC   Post-op Pain Management: Minimal or no pain anticipated   Induction: Intravenous  PONV Risk Score and Plan: 0 and Propofol infusion  Airway Management Planned: Natural Airway and Nasal Cannula  Additional Equipment: Arterial line  Intra-op Plan:   Post-operative Plan:   Informed Consent: I have reviewed the patients History and Physical, chart, labs and discussed the procedure including the risks, benefits and alternatives for the proposed anesthesia with the patient or authorized representative who has indicated his/her understanding and acceptance.       Plan Discussed with: CRNA  Anesthesia Plan Comments:        Anesthesia Quick Evaluation

## 2023-10-23 NOTE — Discharge Summary (Incomplete)
 [facility]   MULTIDISCIPLINARY HEART VALVE TEAM  Discharge Summary    Patient ID: Henry Holland MRN: [id number]; DOB: 03/02/1933  Admit date: 10/23/2023 Discharge date: 10/24/2023  Primary Care Provider: Cleatis Polka., MD  Primary Cardiologist: Jodelle Red, MD / Dr. Clifton James & Dr. Leafy Ro (TAVR)  Discharge Diagnoses    Principal Problem:   S/P TAVR (transcatheter aortic valve replacement) Active Problems:   Hypertension   Chronic atrial fibrillation (HCC)   Hyperlipidemia   Diabetic peripheral neuropathy associated with type 2 diabetes mellitus (HCC)   History of malignant neoplasm of prostate   Thrombocytopenia (HCC)   Severe aortic stenosis   Mitral regurgitation   Allergies Allergies  Allergen Reactions   Atorvastatin Other (See Comments)    Muscle aches and pain   Ezetimibe Other (See Comments)    Muscle aches and pain   Fenofibrate Other (See Comments)    Muscle aches and pain    Diagnostic Studies/Procedures    [facility]  TAVR OPERATIVE NOTE     Date of Procedure:                10/23/2023   Preoperative Diagnosis:      Severe Aortic Stenosis    Postoperative Diagnosis:    Same    Procedure:        Transcatheter Aortic Valve Replacement - Transfemoral Approach             Edwards Sapien 3 THV (size 26 mm, model # [id number], serial # [id number] )              Co-Surgeons:                        Verne Carrow, MD and Eugenio Hoes , MD    Anesthesiologist:                  Hart Rochester   Echocardiographer:              Eden Emms   Pre-operative Echo Findings: Severe aortic stenosis Normal left ventricular systolic function   Post-operative Echo Findings: No paravalvular leak Normal left ventricular systolic function _____________    Echo 10/24/23:  IMPRESSIONS   1. Left ventricular ejection fraction, by estimation, is 55 to 60%. The  left ventricle has normal function. The left ventricle has no  regional  wall motion abnormalities. The left ventricular internal cavity size was  mildly dilated. There is moderate concentric left ventricular hypertrophy. Left ventricular diastolic  parameters are indeterminate.   2. Right ventricular systolic function is normal. The right ventricular  size is moderately enlarged. There is moderately elevated pulmonary artery  systolic pressure.   3. Left atrial size was severely dilated.   4. Right atrial size was severely dilated.   5. The mitral valve is degenerative. Severe mitral valve regurgitation.  Mild mitral stenosis. Severe mitral annular calcification.   6. Tricuspid valve regurgitation is moderate.   7. The aortic valve has been repaired/replaced. Aortic valve  regurgitation is not visualized. No aortic stenosis is present. There is a  26 mm Edwards Sapien prosthetic (TAVR) valve present in the aortic  position. Procedure Date: 10/23/2023. Echo findings   are consistent with normal structure and function of the aortic valve  prosthesis. Aortic valve area, by VTI measures 3.54 cm. Aortic valve mean  gradient measures 9.0 mmHg. Aortic valve Vmax measures 2.20 m/s.   8. The inferior vena cava is  dilated in size with <50% respiratory  variability, suggesting right atrial pressure of 15 mmHg.   History of Present Illness     Henry Holland is a 89 y.o. male with a history of persistent atrial fibrillation on warfarin (pt preference), HTN, HLD, prostate cancer in 2012 s/p XRT and pLFLG severe aortic stenosis who presented to [facility] on 10/23/23 for planned TAVR.   He had prostate cancer in 2012 treated with radiation therapy. He has been followed for moderate aortic stenosis. Echo 07/17/23 showed EF 60%, mild RVE, severe pLFLG AS with mean grad 26 mmHg, peak grad 46.8 mmHg, AVA 0.76 cm2, DVI 0.24, SVI 24, moderate MAC with mild-mod MR. [facility] 09/19/23 showed mild non-obstructive CAD and mild elevation of PA pressure. Pre TAVR CTs showed a LAA thrombus.    The patient was evaluated by the multidisciplinary valve team and felt to have severe, symptomatic aortic stenosis and to be a suitable candidate for TAVR, which was set up for 10/23/23. His PCP, Dr. Clelia Croft, put him on a Lovenox bridge for TAVR.  Hospital Course     Consultants: none  Severe AS:  -- S/p successful TAVR with a 26 mm Edwards Sapien 3 Ultra Resilia THV via the TF approach on 10/24/23.  -- Post operative echo showed EF 55%, mod concentric LVH, mod RVE, mod pulm HTN, normally functioning TAVR with a mean gradient of 9 mmHg and no PVL as well as severe MAC, mild MS and severe MR. -- Groin sites are stable.  -- ECG with sinus and old IRBBB and no high grade heart block. -- Resumed on home Coumadin with Lovenox bridge as outline by PCP.  -- Met with cardiac rehab to discuss CRP phase II.  -- Plan for discharge home today with close follow up in the outpatient setting.    Acute diastolic CHF: -- Elevated LVEDP ~23 at the time of TAVR.  -- Treated with one dose of IV Lasix 20mg  and Kdur x1.  -- Not on standing diuretics at home.  Mitral regurgitation:  -- Echo today with severe MAC, mild MS and severe MR.  Persistent atrial fibrillation: -- Rate well controlled. Did have some slow hearts rates with one 2.4 second pause post operatively. Now HRs 70-80s -- Resume home Cardizem CD 120mg  daily at discharge.  -- Resume home Coumadin with Lovenox bridge.  HTN: -- BP elevated in the setting of holding home antihypertensives.  -- Resume home losartan 100mg  daily  HLD:  -- Continued on Crestor 5mg  daily  Hypomagnesemia:  --Mag 1.6.  -- Repleted with 2g IV mag.   Thrombocytopenia: -- PLT 139-->83.  -- Related to surgery.  -- Continue to monitor.  DMT2:  -- Treated with SSI while admitted.  -- Resume home meds at discharge. Okay to resume Metformin after 48 hours after contrast dye exposure (2/27AM)   LAA thrombus:  -- Pre TAVR CTs showed a large left atrial  appendage with multiple filling defects likely representing thrombus.  -- He was bridged with Lovenox prior to TAVR and will continue bridge after. Will restart Lovenox and Coumadin tonight.  Thyroid nodule:  -- Pre TAVR CTs showed a ~1.6 cm hypoechoic nodule in the left superior thyroid gland.  -- Recommend further evaluation with dedicated thyroid ultrasound. -- This will be discussed in the outpatient setting.  _____________  Discharge Vitals Blood pressure (!) 158/87, pulse 73, temperature 98.5 F (36.9 C), temperature source Oral, resp. rate 20, height 6\' 1"  (1.854 m), weight 75.8 kg,  SpO2 97%.  Filed Weights   10/22/23 1200 10/23/23 0854 10/24/23 0433  Weight: 76.2 kg 75.8 kg 75.8 kg     GEN: Well nourished, well developed, in no acute distress HEENT: normal Neck: no JVD or masses Cardiac: irreg irreg; no murmurs, rubs, or gallops,no edema  Respiratory:  clear to auscultation bilaterally, normal work of breathing GI: soft, nontender, nondistended, + BS MS: no deformity or atrophy Skin: warm and dry, no rash.  Groin sites clear without hematoma or ecchymosis  Neuro:  Alert and Oriented x 3, Strength and sensation are intact Psych: euthymic mood, full affect   Disposition   Pt is being discharged home today in good condition.  Follow-up Plans & Appointments     Follow-up Information     Janetta Hora, PA-C. Go on 11/02/2023.   Specialties: Cardiology, Radiology Why: @ 9:50 am. Please arrive at least 15 minutes early Contact information: [address] [place] [address] [phone]                Discharge Instructions     Amb Referral to Cardiac Rehabilitation   Complete by: As directed    Diagnosis: Valve Replacement   Valve: Aortic   After initial evaluation and assessments completed: Virtual Based Care may be provided alone or in conjunction with Phase 2 Cardiac Rehab based on patient barriers.: Yes   Intensive Cardiac  Rehabilitation (ICR) [facility] location only OR Traditional Cardiac Rehabilitation (TCR) *If criteria for ICR are not met will enroll in TCR [facility] only): Yes       Discharge Medications   Allergies as of 10/24/2023       Reactions   Atorvastatin Other (See Comments)   Muscle aches and pain   Ezetimibe Other (See Comments)   Muscle aches and pain   Fenofibrate Other (See Comments)   Muscle aches and pain        Medication List     PAUSE taking these medications    enoxaparin 80 MG/0.8ML injection Wait to take this until: October 24, 2023 Evening Commonly known as: LOVENOX Inject 80 mg into the skin every 12 (twelve) hours.   metFORMIN 500 MG 24 hr tablet Wait to take this until: October 25, 2023 Morning Commonly known as: GLUCOPHAGE-XR Take 500 mg by mouth 2 (two) times daily with a meal.       TAKE these medications    ascorbic acid 500 MG tablet Commonly known as: VITAMIN C Take 500 mg by mouth daily.   diltiazem 120 MG 24 hr capsule Commonly known as: CARDIZEM CD Take 1 capsule (120 mg total) by mouth daily.   Jantoven 5 MG tablet Generic drug: warfarin Take 7.5-10 mg by mouth See admin instructions. Take with 10 mg Mon Wed Fri and Sat, and 7.5 mg Sun Tues, and Thurs   losartan 100 MG tablet Commonly known as: COZAAR Take 100 mg by mouth daily.   onetouch ultrasoft lancets use for OneTouch Ultra meter to test up to bid   OneTouch Verio test strip Generic drug: glucose blood Use to check blood sugar 2 times a day as directed Dx E11.4   rosuvastatin 5 MG tablet Commonly known as: CRESTOR Take 5 mg by mouth daily.   Vitamin D3 1000 units Caps Take 1,000 Units by mouth daily.         Outstanding Labs/Studies   INR with Dr. Clelia Croft 3/4  ______________________  Duration of Discharge Encounter: APP Time: 20 minutes  SignedCline Crock, PA-C 10/24/2023, 10:39 AM [phone]  I have personally seen and examined this patient. I agree  with the assessment and plan as outlined above.  89 yo male with history of HTN, HLD, DM, Atrial fibrillation on coumadin with severe paradoxical low flow/low gradient AS. He underwent TAVR on 09/22/23 with placement of a 26 mm Edwards Sapien 3 THV from the right transfemoral approach. No complication during the procedure.  He did well overnight.  No complaints this am.  Labs reviewed by me. Renal function stable. H/H stable.  Mg is low Echo images reviewed by me. Normal LV function. AVR working well EKG reviewed by me and shows atrial fib with incomplete RBBB My exam: NAD  WG:NFAOZ irreg with soft systolic murmur  Lungs: clear bilaterally Ext: Bilateral groins soft without hematoma.  Plan: Will d/c home today. Will resume coumadin and bridge with Lovenox given findings of LAA thrombus on pre-TAVR imaging.   My time spent on chart review, lab review, EKG review, Echo review, examination of patient and d/c planning was 25 minutes.   Verne Carrow, MD, [facility] 10/24/2023 10:48 AM

## 2023-10-23 NOTE — Progress Notes (Signed)
  HEART AND VASCULAR CENTER   MULTIDISCIPLINARY HEART VALVE TEAM  Patient doing well s/p TAVR. He is hemodynamically stable. Groin sites stable. ECG with aifb with slow VR and mild QRS widening. Tele shows afib with HRs in 50-60s with one 2.4 second pause. There is no evidence of HAVB. Given elevated LVEDP ~23, will treat with one dose of IV Lasix 20mg  and Kdur x1. Transferred from cath lab holding to 4E. Early ambulation after bedrest completed and hopeful discharge over the next 24-48 hours.   Cline Crock PA-C  [facility]  Pager [phone]

## 2023-10-23 NOTE — Discharge Instructions (Signed)

## 2023-10-23 NOTE — Op Note (Signed)
 [facility] VASCULAR CENTER   MULTIDISCIPLINARY HEART VALVE TEAM   TAVR OPERATIVE NOTE   Date of Procedure:  10/23/2023  Preoperative Diagnosis: Severe Aortic Stenosis   Postoperative Diagnosis: Same   Procedure:   Transcatheter Aortic Valve Replacement - Percutaneous right Transfemoral Approach  Edwards Sapien 3 Ultra THV (size 26 mm, model # [id number], )   Co-Surgeons:  Eugenio Hoes MD and  Verne Carrow    Anesthesiologist:  Shona Simpson MD  Echocardiographer:  Charlton Haws MD  Pre-operative Echo Findings: Severe aortic stenosis normal left ventricular systolic function  Post-operative Echo Findings: no paravalvular leak normal left ventricular systolic function   BRIEF CLINICAL NOTE AND INDICATIONS FOR SURGERY  89 yo male with NYHA class 2 symptoms of severe LFLG AS with normal LV function and no CAD. Pt has a class I indication for AVR and with his age and comorbidities would best be served with TAVR. He understands all the risks goals and recovery from TAVR and wishes to proceed. He is not a bail out candidate. He will have femoral access and a 26mm sapien valve     DETAILS OF THE OPERATIVE PROCEDURE  PREPARATION:    The patient was brought to the operating room on the above mentioned date and appropriate monitoring was established by the anesthesia team. The patient was placed in the supine position on the operating table.  Intravenous antibiotics were administered. The patient was monitored closely throughout the procedure under conscious sedation.    Baseline transthoracic echocardiogram was performed. The patient's abdomen and both groins were prepped and draped in a sterile manner. A time out procedure was performed.   PERIPHERAL ACCESS:    Using the modified Seldinger technique, femoral arterial and venous access was obtained with placement of 6 Fr sheaths on the left side.  A pigtail diagnostic catheter was passed through the left arterial sheath  under fluoroscopic guidance into the aortic root.  A temporary transvenous pacemaker catheter was passed through the left femoral venous sheath under fluoroscopic guidance into the right ventricle.  The pacemaker was tested to ensure stable lead placement and pacemaker capture. Aortic root angiography was performed in order to determine the optimal angiographic angle for valve deployment.   TRANSFEMORAL ACCESS:   Percutaneous transfemoral access and sheath placement was performed using ultrasound guidance.  The right common femoral artery was cannulated using a micropuncture needle and appropriate location was verified using hand injection angiogram.  A pair of Abbott Perclose percutaneous closure devices were placed and a 6 French sheath replaced into the femoral artery.  The patient was heparinized systemically and ACT verified > 250 seconds.    A 14 Fr transfemoral E-sheath was introduced into the right common femoral artery after progressively dilating over an Amplatz superstiff wire. An AL1 catheter was used to direct a straight-tip exchange length wire across the native aortic valve into the left ventricle. This was exchanged out for a pigtail catheter and position was confirmed in the LV apex. Simultaneous LV and Ao pressures were recorded. LVEDP was  The pigtail catheter was exchanged for a Safari wire in the LV apex.   BALLOON AORTIC VALVULOPLASTY:  Not done   TRANSCATHETER HEART VALVE DEPLOYMENT:   An Edwards Sapien 3 Ultra transcatheter heart valve (size 26 mm) was prepared and crimped per manufacturer's guidelines, and the proper orientation of the valve is confirmed on the [employer] delivery system. The valve was advanced through the introducer sheath using normal technique until in an appropriate  position in the abdominal aorta beyond the sheath tip. The balloon was then retracted and using the fine-tuning wheel was centered on the valve. The valve was then advanced across  the aortic arch using appropriate flexion of the catheter. The valve was carefully positioned across the aortic valve annulus. The Commander catheter was retracted using normal technique. Once final position of the valve has been confirmed by angiographic assessment, the valve is deployed during rapid ventricular pacing to maintain systolic blood pressure < 50 mmHg and pulse pressure < 10 mmHg. The balloon inflation is held for >3 seconds after reaching full deployment volume. Once the balloon has fully deflated the balloon is retracted into the ascending aorta and valve function is assessed using echocardiography. There is felt to be no paravalvular leak and no central aortic insufficiency.  The patient's hemodynamic recovery following valve deployment is good.  The deployment balloon and guidewire are both removed.    PROCEDURE COMPLETION:   The sheath was removed and femoral artery closure performed.  Protamine was administered once femoral arterial repair was complete. The temporary pacemaker, pigtail catheter and femoral sheaths were removed with manual pressure used for venous hemostasis.  A Mynx femoral closure device was utilized following removal of the diagnostic sheath in the left femoral artery.  The patient tolerated the procedure well and is transported to the cath lab recovery area in stable condition. There were no immediate intraoperative complications. All sponge instrument and needle counts are verified correct at completion of the operation.   No blood products were administered during the operation.  The patient received a total of 30 mL of intravenous contrast during the procedure.   Eugenio Hoes, MD 10/23/2023 1:16 PM

## 2023-10-23 NOTE — Interval H&P Note (Signed)
 History and Physical Interval Note:  10/23/2023 9:47 AM  Henry Holland  has presented today for surgery, with the diagnosis of Severe Aortic Stenosis.  The various methods of treatment have been discussed with the patient and family. After consideration of risks, benefits and other options for treatment, the patient has consented to  Procedure(s): Transcatheter Aortic Valve Replacement, Transfemoral (N/A) INTRAOPERATIVE TRANSTHORACIC ECHOCARDIOGRAM (N/A) as a surgical intervention.  The patient's history has been reviewed, patient examined, no change in status, stable for surgery.  I have reviewed the patient's chart and labs.  Questions were answered to the patient's satisfaction.     Verne Carrow

## 2023-10-23 NOTE — Anesthesia Procedure Notes (Signed)
 Arterial Line Insertion Start/End2/25/2025 9:20 AM Performed by: Garfield Cornea, CRNA, CRNA  Patient location: Pre-op. Preanesthetic checklist: patient identified, IV checked, site marked, risks and benefits discussed, surgical consent, monitors and equipment checked, pre-op evaluation, timeout performed and anesthesia consent Lidocaine 1% used for infiltration Left, radial was placed Catheter size: 20 G Hand hygiene performed  and maximum sterile barriers used   Attempts: 1 Procedure performed without using ultrasound guided technique. Following insertion, dressing applied and Biopatch. Post procedure assessment: normal and unchanged  Patient tolerated the procedure well with no immediate complications.

## 2023-10-23 NOTE — CV Procedure (Signed)
 [facility]ART AND VASCULAR CENTER  TAVR OPERATIVE NOTE   Date of Procedure:  10/23/2023  Preoperative Diagnosis: Severe Aortic Stenosis   Postoperative Diagnosis: Same   Procedure:   Transcatheter Aortic Valve Replacement - Transfemoral Approach  Edwards Sapien 3 THV (size 26 mm, model # [id number], serial # [id number] )   Co-Surgeons:  Verne Carrow, MD and Eugenio Hoes , MD   Anesthesiologist:  Hart Rochester  Echocardiographer:  Eden Emms  Pre-operative Echo Findings: Severe aortic stenosis Normal left ventricular systolic function  Post-operative Echo Findings: No paravalvular leak Normal left ventricular systolic function  BRIEF CLINICAL NOTE AND INDICATIONS FOR SURGERY  89 yo male with history of persistent atrial fibrillation, HTN, HLD, prostate cancer and severe aortic stenosis who is here today for TAVR. He has persistent atrial fibrillation and is on coumadin. He had prostate cancer in 2012 treated with radiation therapy. He has been followed for moderate aortic stenosis. Echo 07/17/23 with LVEF=60-65%. Mild to moderate MR. Severe paradoxical low flow/low gradient aortic stenosis with mean gradient 26 mmHg, AVA 0.70 cm2, DI 0.24, SVI 24.   During the course of the patient's preoperative work up they have been evaluated comprehensively by a multidisciplinary team of specialists coordinated through the Multidisciplinary Heart Valve Clinic in the [facility].  They have been demonstrated to suffer from symptomatic severe aortic stenosis as noted above. The patient has been counseled extensively as to the relative risks and benefits of all options for the treatment of severe aortic stenosis including long term medical therapy, conventional surgery for aortic valve replacement, and transcatheter aortic valve replacement.  The patient has been independently evaluated by Dr. Leafy Ro with CT surgery and they are felt to be at high risk for conventional surgical aortic  valve replacement. The surgeon indicated the patient would be a poor candidate for conventional surgery. Based upon review of all of the patient's preoperative diagnostic tests they are felt to be candidate for transcatheter aortic valve replacement using the transfemoral approach as an alternative to high risk conventional surgery.    Following the decision to proceed with transcatheter aortic valve replacement, a discussion has been held regarding what types of management strategies would be attempted intraoperatively in the event of life-threatening complications, including whether or not the patient would be considered a candidate for the use of cardiopulmonary bypass and/or conversion to open sternotomy for attempted surgical intervention.  The patient has been advised of a variety of complications that might develop peculiar to this approach including but not limited to risks of death, stroke, paravalvular leak, aortic dissection or other major vascular complications, aortic annulus rupture, device embolization, cardiac rupture or perforation, acute myocardial infarction, arrhythmia, heart block or bradycardia requiring permanent pacemaker placement, congestive heart failure, respiratory failure, renal failure, pneumonia, infection, other late complications related to structural valve deterioration or migration, or other complications that might ultimately cause a temporary or permanent loss of functional independence or other long term morbidity.  The patient provides full informed consent for the procedure as described and all questions were answered preoperatively.    DETAILS OF THE OPERATIVE PROCEDURE  PREPARATION:   The patient is brought to the operating room on the above mentioned date and central monitoring was established by the anesthesia team including placement of a radial arterial line. The patient is placed in the supine position on the operating table.  Intravenous antibiotics are  administered. Conscious sedation is used.   Baseline transthoracic echocardiogram was performed. The patient's chest, abdomen, both  groins, and both lower extremities are prepared and draped in a sterile manner. A time out procedure is performed.   PERIPHERAL ACCESS:   Using the modified Seldinger technique, femoral arterial and venous access were obtained with placement of a 6 Fr sheath in the artery and a 7 Fr sheath in the vein on the left side using u/s guidance.  A pigtail diagnostic catheter was passed through the femoral arterial sheath under fluoroscopic guidance into the aortic root.  A temporary transvenous pacemaker catheter was passed through the femoral venous sheath under fluoroscopic guidance into the right ventricle.  The pacemaker was tested to ensure stable lead placement and pacemaker capture. Aortic root angiography was performed in order to determine the optimal angiographic angle for valve deployment.  TRANSFEMORAL ACCESS:  A micropuncture kit was used to gain access to the right femoral artery using u/s guidance. Position confirmed with angiography. Pre-closure with double ProGlide closure devices. The patient was heparinized systemically and ACT verified > 250 seconds.    A 14 Fr transfemoral E-sheath was introduced into the right femoral artery after progressively dilating over an Amplatz superstiff wire. An AL-2 catheter was used to direct a J wire across the native aortic valve into the left ventricle. This was exchanged out for a pigtail catheter and position was confirmed in the LV apex. Simultaneous LV and Ao pressures were recorded.  The pigtail catheter was then exchanged for a Safari wire in the LV apex.   TRANSCATHETER HEART VALVE DEPLOYMENT:  An Edwards Sapien 3 THV (size 26 mm) was prepared and crimped per manufacturer's guidelines, and the proper orientation of the valve is confirmed on the [employer] delivery system. The valve was advanced through the  introducer sheath using normal technique until in an appropriate position in the abdominal aorta beyond the sheath tip. The balloon was then retracted and using the fine-tuning wheel was centered on the valve. The valve was then advanced across the aortic arch using appropriate flexion of the catheter. The valve was carefully positioned across the aortic valve annulus. The Commander catheter was retracted using normal technique. Once final position of the valve has been confirmed by angiographic assessment, the valve is deployed while temporarily holding ventilation and during rapid ventricular pacing to maintain systolic blood pressure < 50 mmHg and pulse pressure < 10 mmHg. The balloon inflation is held for >3 seconds after reaching full deployment volume. Once the balloon has fully deflated the balloon is retracted into the ascending aorta and valve function is assessed using TTE. There is felt to be no paravalvular leak and no central aortic insufficiency.  The patient's hemodynamic recovery following valve deployment is good.  The deployment balloon and guidewire are both removed. Echo demostrated acceptable post-procedural gradients, stable mitral valve function, and no AI.   PROCEDURE COMPLETION:  The sheath was then removed and closure devices were completed. Protamine was administered once femoral arterial repair was complete. The temporary pacemaker, pigtail catheters and femoral sheaths were removed with a Mynx closure device placed in the artery and manual pressure used for venous hemostasis.    The patient tolerated the procedure well and is transported to the surgical intensive care in stable condition. There were no immediate intraoperative complications. All sponge instrument and needle counts are verified correct at completion of the operation.   No blood products were administered during the operation.  The patient received a total of 40 mL of intravenous contrast during the  procedure.  LVEDP: 23 mm Hg  Verne Carrow MD, [facility] 10/23/2023 1:23 PM

## 2023-10-23 NOTE — Transfer of Care (Signed)
 Immediate Anesthesia Transfer of Care Note  Patient: Henry Holland  Procedure(s) Performed: Transcatheter Aortic Valve Replacement, Transfemoral INTRAOPERATIVE TRANSTHORACIC ECHOCARDIOGRAM  Patient Location: Cath Lab  Anesthesia Type:MAC  Level of Consciousness: awake, alert , and oriented  Airway & Oxygen Therapy: Patient Spontanous Breathing and Patient connected to nasal cannula oxygen  Post-op Assessment: Report given to RN, Post -op Vital signs reviewed and stable, Patient moving all extremities X 4, and Patient able to stick tongue midline  Post vital signs: Reviewed and stable  Last Vitals:  Vitals Value Taken Time  BP 103/70 10/23/23 1319  Temp 98.6   Pulse 70 10/23/23 1323  Resp 11 10/23/23 1323  SpO2 97 % 10/23/23 1323  Vitals shown include unfiled device data.  Last Pain:  Vitals:   10/23/23 1241  TempSrc:   PainSc: Asleep      Patients Stated Pain Goal: 0 (10/23/23 0936)  Complications: There were no known notable events for this encounter.

## 2023-10-24 ENCOUNTER — Inpatient Hospital Stay (HOSPITAL_COMMUNITY): Payer: Medicare HMO

## 2023-10-24 DIAGNOSIS — I350 Nonrheumatic aortic (valve) stenosis: Secondary | ICD-10-CM

## 2023-10-24 DIAGNOSIS — Z952 Presence of prosthetic heart valve: Secondary | ICD-10-CM

## 2023-10-24 DIAGNOSIS — I340 Nonrheumatic mitral (valve) insufficiency: Secondary | ICD-10-CM | POA: Insufficient documentation

## 2023-10-24 LAB — ECHOCARDIOGRAM COMPLETE
AR max vel: 3.08 cm2
AV Area VTI: 3.54 cm2
AV Area mean vel: 3.56 cm2
AV Mean grad: 9 mm[Hg]
AV Peak grad: 19.4 mm[Hg]
Ao pk vel: 2.2 m/s
Calc EF: 56.7 %
Height: 73 in
MV M vel: 5.12 m/s
MV Peak grad: 104.9 mm[Hg]
MV VTI: 2.76 cm2
Radius: 0.55 cm
S' Lateral: 3.1 cm
Single Plane A2C EF: 57.7 %
Single Plane A4C EF: 55.1 %
Weight: 2672 [oz_av]

## 2023-10-24 LAB — CBC
HCT: 33.1 % — ABNORMAL LOW (ref 39.0–52.0)
Hemoglobin: 11 g/dL — ABNORMAL LOW (ref 13.0–17.0)
MCH: 32.9 pg (ref 26.0–34.0)
MCHC: 33.2 g/dL (ref 30.0–36.0)
MCV: 99.1 fL (ref 80.0–100.0)
Platelets: 83 10*3/uL — ABNORMAL LOW (ref 150–400)
RBC: 3.34 MIL/uL — ABNORMAL LOW (ref 4.22–5.81)
RDW: 13.9 % (ref 11.5–15.5)
WBC: 5.1 10*3/uL (ref 4.0–10.5)
nRBC: 0 % (ref 0.0–0.2)

## 2023-10-24 LAB — BASIC METABOLIC PANEL
Anion gap: 8 (ref 5–15)
BUN: 11 mg/dL (ref 8–23)
CO2: 23 mmol/L (ref 22–32)
Calcium: 9.2 mg/dL (ref 8.9–10.3)
Chloride: 103 mmol/L (ref 98–111)
Creatinine, Ser: 0.68 mg/dL (ref 0.61–1.24)
GFR, Estimated: >60 mL/min (ref >60)
Glucose, Bld: 139 mg/dL — ABNORMAL HIGH (ref 70–99)
Potassium: 4 mmol/L (ref 3.5–5.1)
Sodium: 134 mmol/L — ABNORMAL LOW (ref 135–145)

## 2023-10-24 LAB — GLUCOSE, CAPILLARY
Glucose-Capillary: 150 mg/dL — ABNORMAL HIGH (ref 70–99)
Glucose-Capillary: 297 mg/dL — ABNORMAL HIGH (ref 70–99)
Glucose-Capillary: 165 mg/dL — ABNORMAL HIGH (ref 70–99)

## 2023-10-24 LAB — MAGNESIUM: Magnesium: 1.6 mg/dL — ABNORMAL LOW (ref 1.7–2.4)

## 2023-10-24 MED ORDER — MAGNESIUM SULFATE 2 GM/50ML IV SOLN
2.0000 g | Freq: Once | INTRAVENOUS | Status: AC
  Administered 2023-10-24: 2 g via INTRAVENOUS
  Filled 2023-10-24: qty 50

## 2023-10-24 MED ORDER — MAGNESIUM OXIDE -MG SUPPLEMENT 400 (240 MG) MG PO TABS: 400.0000 mg | ORAL_TABLET | Freq: Once | ORAL | Status: DC

## 2023-10-24 NOTE — Progress Notes (Signed)
 Mobility Specialist Progress Note:    10/24/23 1050  Mobility  Activity Ambulated with assistance in hallway  Level of Assistance Contact guard assist, steadying assist  Assistive Device None  Distance Ambulated (ft) 300 ft  Activity Response Tolerated well  Mobility Referral Yes  Mobility visit 1 Mobility  Mobility Specialist Start Time (ACUTE ONLY) 1050  Mobility Specialist Stop Time (ACUTE ONLY) 1057  Mobility Specialist Time Calculation (min) (ACUTE ONLY) 7 min   Pt received in bed, wife at bedside. Agreeable to mobility session. CGA with gait belt for safety, no AD required. Max HR 107 bpm. Tolerated well, asx throughout. Returned pt to room, all needs met, eager for d/c.   Feliciana Rossetti Mobility Specialist Please contact via SecureChat or  Rehab office at [phone]

## 2023-10-24 NOTE — Progress Notes (Signed)
 CARDIAC REHAB PHASE I  Ed done w/ pt and spouse. Discussed restrictions, exercise guidelines, checking for signs of infection, and f/u with TAVR team. Pt voiced understanding. Pt interested in CRPH2. Will refer to [facility].   [id number] Joya San, MS, ACSM-CEP 10/24/2023 9:44 AM

## 2023-10-24 NOTE — Progress Notes (Signed)
   10/24/23 1109  TOC Brief Assessment  Insurance and Status Reviewed  Patient has primary care physician Yes  Home environment has been reviewed home w/ spouse  Prior level of function: independent  Prior/Current Home Services No current home services  Social Drivers of Health Review SDOH reviewed no interventions necessary  Readmission risk has been reviewed Yes  Transition of care needs no transition of care needs at this time    Pt s/p TAVR, stable for transition home today, no HH or DME needs noted, family to transport home

## 2023-10-24 NOTE — Plan of Care (Signed)
  Problem: Activity: Goal: Risk for activity intolerance will decrease Outcome: Progressing   Problem: Nutrition: Goal: Adequate nutrition will be maintained Outcome: Progressing   Problem: Coping: Goal: Level of anxiety will decrease Outcome: Progressing   Problem: Elimination: Goal: Will not experience complications related to bowel motility Outcome: Progressing   Problem: Elimination: Goal: Will not experience complications related to urinary retention Outcome: Progressing   Problem: Pain Managment: Goal: General experience of comfort will improve and/or be controlled Outcome: Progressing   Problem: Safety: Goal: Ability to remain free from injury will improve Outcome: Progressing

## 2023-10-24 NOTE — Progress Notes (Signed)
  Echocardiogram 2D Echocardiogram has been performed.  Janalyn Harder 10/24/2023, 9:16 AM

## 2023-10-24 NOTE — Progress Notes (Signed)
 Patient and family given discharge instructions, medication list and follow up appointments. Patient had 2 PIV that were removed and telemetry removed. Family at bedside will discharge home as ordered. Aylani Spurlock, Randall An RN

## 2023-10-24 NOTE — Plan of Care (Signed)
  Problem: Education: Goal: Knowledge of General Education information will improve Description: Including pain rating scale, medication(s)/side effects and non-pharmacologic comfort measures Outcome: Progressing   Problem: Coping: Goal: Level of anxiety will decrease Outcome: Progressing   Problem: Elimination: Goal: Will not experience complications related to bowel motility Outcome: Progressing   

## 2023-10-25 ENCOUNTER — Telehealth: Payer: Self-pay | Admitting: Physician Assistant

## 2023-10-25 NOTE — Telephone Encounter (Signed)
  HEART AND VASCULAR CENTER   MULTIDISCIPLINARY HEART VALVE TEAM   Patient contacted regarding discharge from [facility] on 10/24/23  Patient understands to follow up with a structural heart APP on 11/02/23 at [address].  Patient understands discharge instructions? yes Patient understands medications and regimen? yes Patient understands to bring all medications to this visit? yes  Cline Crock PA-C  [facility]

## 2023-10-27 ENCOUNTER — Emergency Department (HOSPITAL_BASED_OUTPATIENT_CLINIC_OR_DEPARTMENT_OTHER)

## 2023-10-27 ENCOUNTER — Encounter (HOSPITAL_BASED_OUTPATIENT_CLINIC_OR_DEPARTMENT_OTHER): Payer: Self-pay | Admitting: Emergency Medicine

## 2023-10-27 ENCOUNTER — Emergency Department (HOSPITAL_BASED_OUTPATIENT_CLINIC_OR_DEPARTMENT_OTHER)
Admission: EM | Admit: 2023-10-27 | Discharge: 2023-10-27 | Disposition: A | Discharge status: Home / Self Care | Attending: Emergency Medicine | Admitting: Emergency Medicine

## 2023-10-27 DIAGNOSIS — G319 Degenerative disease of nervous system, unspecified: Secondary | ICD-10-CM | POA: Diagnosis not present

## 2023-10-27 DIAGNOSIS — Z79899 Other long term (current) drug therapy: Secondary | ICD-10-CM | POA: Diagnosis not present

## 2023-10-27 DIAGNOSIS — S0081XA Abrasion of other part of head, initial encounter: Secondary | ICD-10-CM | POA: Diagnosis not present

## 2023-10-27 DIAGNOSIS — I10 Essential (primary) hypertension: Secondary | ICD-10-CM | POA: Diagnosis not present

## 2023-10-27 DIAGNOSIS — S199XXA Unspecified injury of neck, initial encounter: Secondary | ICD-10-CM | POA: Diagnosis not present

## 2023-10-27 DIAGNOSIS — Z7901 Long term (current) use of anticoagulants: Secondary | ICD-10-CM | POA: Diagnosis not present

## 2023-10-27 DIAGNOSIS — R519 Headache, unspecified: Secondary | ICD-10-CM | POA: Diagnosis not present

## 2023-10-27 DIAGNOSIS — S0990XA Unspecified injury of head, initial encounter: Secondary | ICD-10-CM | POA: Diagnosis not present

## 2023-10-27 DIAGNOSIS — Y92009 Unspecified place in unspecified non-institutional (private) residence as the place of occurrence of the external cause: Secondary | ICD-10-CM | POA: Diagnosis not present

## 2023-10-27 DIAGNOSIS — W19XXXA Unspecified fall, initial encounter: Secondary | ICD-10-CM | POA: Diagnosis not present

## 2023-10-27 NOTE — ED Provider Notes (Signed)
 [place] EMERGENCY DEPARTMENT AT [facility] Provider Note   CSN: [id number] Arrival date & time: 10/27/23  1478     History  Chief Complaint  Patient presents with   Gaelen Brager is a 89 y.o. male.  Patient here after mechanical fall at home.  Abrasion to his right forehead.  He is on Coumadin.  History of TAVR.  This was last week.  His surgical sites have been doing well.  He has no pain elsewhere.  He is ambulatory after the fall.  Did not lose consciousness.  No hip pain extremity pain neck pain.  History of A-fib hypertension aortic stenosis now status post TAVR.  The history is provided by the patient.       Home Medications Prior to Admission medications   Medication Sig Start Date End Date Taking? Authorizing Provider  ascorbic acid (VITAMIN C) 500 MG tablet Take 500 mg by mouth daily.    [provider]  Cholecalciferol (VITAMIN D3) 1000 units CAPS Take 1,000 Units by mouth daily.    [provider]  diltiazem (CARDIZEM CD) 120 MG 24 hr capsule Take 1 capsule (120 mg total) by mouth daily. 07/30/20   [place], Peter M, MD  enoxaparin (LOVENOX) 80 MG/0.8ML injection Inject 80 mg into the skin every 12 (twelve) hours. 10/16/23 10/25/23  [provider]  glucose blood (ONETOUCH VERIO) test strip Use to check blood sugar 2 times a day as directed Dx E11.4 11/13/16   [provider]  Lancets Letta Pate ULTRASOFT) lancets use for OneTouch Ultra meter to test up to bid 11/13/16   [provider]  losartan (COZAAR) 100 MG tablet Take 100 mg by mouth daily.      [provider]  metFORMIN (GLUCOPHAGE-XR) 500 MG 24 hr tablet Take 500 mg by mouth 2 (two) times daily with a meal.    [provider]  rosuvastatin (CRESTOR) 5 MG tablet Take 5 mg by mouth daily.      [provider]  warfarin (JANTOVEN) 5 MG tablet Take 7.5-10 mg by mouth See admin instructions. Take with 10 mg Mon Wed Fri and Sat, and 7.5  mg Sun Tues, and [employer]    [provider]      Allergies    Atorvastatin, Ezetimibe, and Fenofibrate    Review of Systems   Review of Systems  Physical Exam Updated Vital Signs BP 139/67   Pulse 78   Temp 97.8 F (36.6 C)   Resp 16   Wt 74.8 kg   SpO2 99%   BMI 21.77 kg/m  Physical Exam Vitals and nursing note reviewed.  Constitutional:      General: He is not in acute distress.    Appearance: He is well-developed. He is not ill-appearing.  HENT:     Head:     Comments: Abrasion over right side of forehead    Mouth/Throat:     Mouth: Mucous membranes are moist.  Eyes:     Extraocular Movements: Extraocular movements intact.     Conjunctiva/sclera: Conjunctivae normal.     Pupils: Pupils are equal, round, and reactive to light.  Cardiovascular:     Rate and Rhythm: Normal rate and regular rhythm.     Heart sounds: No murmur heard. Pulmonary:     Effort: Pulmonary effort is normal. No respiratory distress.     Breath sounds: Normal breath sounds.  Abdominal:     Palpations: Abdomen is soft.  Tenderness: There is no abdominal tenderness.     Comments: Surgical site in his bilateral groins are clean dry and intact old bruising  Musculoskeletal:        General: No swelling or tenderness. Normal range of motion.     Cervical back: Normal range of motion and neck supple.     Comments: No midline spinal tenderness no extremity tenderness  Skin:    General: Skin is warm and dry.     Capillary Refill: Capillary refill takes less than 2 seconds.  Neurological:     General: No focal deficit present.     Mental Status: He is alert and oriented to person, place, and time.     Cranial Nerves: No cranial nerve deficit.     Sensory: No sensory deficit.     Motor: No weakness.     Coordination: Coordination normal.     Comments: 5+ out of 5 strength all, normal sensation  Psychiatric:        Mood and Affect: Mood normal.     ED Results / Procedures /  Treatments   Labs (all labs ordered are listed, but only abnormal results are displayed) Labs Reviewed - No data to display  EKG None  Radiology CT Head Wo Contrast Result Date: 10/27/2023 CLINICAL DATA:  Blunt poly trauma EXAM: CT HEAD WITHOUT CONTRAST CT CERVICAL SPINE WITHOUT CONTRAST TECHNIQUE: Multidetector CT imaging of the head and cervical spine was performed following the standard protocol without intravenous contrast. Multiplanar CT image reconstructions of the cervical spine were also generated. RADIATION DOSE REDUCTION: This exam was performed according to the departmental dose-optimization program which includes automated exposure control, adjustment of the mA and/or kV according to patient size and/or use of iterative reconstruction technique. COMPARISON:  08/15/2018 FINDINGS: CT HEAD FINDINGS Brain: No evidence of acute infarction, hemorrhage, hydrocephalus, extra-axial collection or mass lesion/mass effect. Generalized brain atrophy. Vascular: No hyperdense vessel or unexpected calcification. Skull: No acute fracture. Sinuses/Orbits: No evidence of injury. CT CERVICAL SPINE FINDINGS Alignment: No traumatic malalignment Skull base and vertebrae: No acute fracture. No primary bone lesion or focal pathologic process. Soft tissues and spinal canal: No prevertebral fluid or swelling. No visible canal hematoma. Disc levels:  Generalized degenerative endplate and facet spurring. Upper chest: Biapical pleural based calcification. IMPRESSION: No evidence of acute intracranial or cervical spine injury. Electronically Signed   By: Tiburcio Pea M.D.   On: 10/27/2023 10:16   CT Cervical Spine Wo Contrast Result Date: 10/27/2023 CLINICAL DATA:  Blunt poly trauma EXAM: CT HEAD WITHOUT CONTRAST CT CERVICAL SPINE WITHOUT CONTRAST TECHNIQUE: Multidetector CT imaging of the head and cervical spine was performed following the standard protocol without intravenous contrast. Multiplanar CT image  reconstructions of the cervical spine were also generated. RADIATION DOSE REDUCTION: This exam was performed according to the departmental dose-optimization program which includes automated exposure control, adjustment of the mA and/or kV according to patient size and/or use of iterative reconstruction technique. COMPARISON:  08/15/2018 FINDINGS: CT HEAD FINDINGS Brain: No evidence of acute infarction, hemorrhage, hydrocephalus, extra-axial collection or mass lesion/mass effect. Generalized brain atrophy. Vascular: No hyperdense vessel or unexpected calcification. Skull: No acute fracture. Sinuses/Orbits: No evidence of injury. CT CERVICAL SPINE FINDINGS Alignment: No traumatic malalignment Skull base and vertebrae: No acute fracture. No primary bone lesion or focal pathologic process. Soft tissues and spinal canal: No prevertebral fluid or swelling. No visible canal hematoma. Disc levels:  Generalized degenerative endplate and facet spurring. Upper chest: Biapical pleural based calcification.  IMPRESSION: No evidence of acute intracranial or cervical spine injury. Electronically Signed   By: Tiburcio Pea M.D.   On: 10/27/2023 10:16    Procedures Procedures    Medications Ordered in ED Medications - No data to display  ED Course/ Medical Decision Making/ A&P                                 Medical Decision Making Amount and/or Complexity of Data Reviewed Radiology: ordered.   JAHSHUA BONITO is here after mechanical fall.  History of atrial fibrillation status post TAVR on Coumadin.  Normal vitals.  No fever.  He tripped on a small step.  Hit his head but did not lose consciousness.  Abrasion to the right side of his forehead.  Does not have pain elsewhere.  Ambulatory after the fall.  No midline spinal pain.  No extremity pain.  Abrasion to the forehead.  He is on blood thinners we will get a head CT neck CT otherwise he is well-appearing.  He had TAVR last week.  His surgical sites in his groins  are well-appearing.  No concern there.  Normal pulses throughout.  Overall head CT neck CT per radiology ports unremarkable.  Patient very well-appearing.  Given reassurance.  Understands return precautions.  Educated about concussions.  Discharged in good condition.  This chart was dictated using voice recognition software.  Despite best efforts to proofread,  errors can occur which can change the documentation meaning.         Final Clinical Impression(s) / ED Diagnoses Final diagnoses:  Abrasion of forehead, initial encounter  Fall, initial encounter    Rx / DC Orders ED Discharge Orders     None         Virgina Norfolk, DO 10/27/23 1031

## 2023-10-27 NOTE — ED Triage Notes (Signed)
 Pt endorses mechanical fall pta, head injury, abrasion noted to RT side forehead. Takes thinners. Denies neck pain GCS 15

## 2023-10-27 NOTE — ED Notes (Signed)
 Dc instructions reviewed with patient. Patient voiced understanding. Dc with belongings.

## 2023-10-30 DIAGNOSIS — I480 Paroxysmal atrial fibrillation: Secondary | ICD-10-CM | POA: Diagnosis not present

## 2023-10-30 DIAGNOSIS — Z7901 Long term (current) use of anticoagulants: Secondary | ICD-10-CM | POA: Diagnosis not present

## 2023-10-30 DIAGNOSIS — D6869 Other thrombophilia: Secondary | ICD-10-CM | POA: Diagnosis not present

## 2023-11-02 ENCOUNTER — Ambulatory Visit: Payer: Medicare HMO | Attending: Physician Assistant | Admitting: Physician Assistant

## 2023-11-02 ENCOUNTER — Ambulatory Visit (HOSPITAL_COMMUNITY)
Admission: RE | Admit: 2023-11-02 | Discharge: 2023-11-02 | Disposition: A | Discharge status: Home / Self Care | Source: Ambulatory Visit | Attending: Physician Assistant | Admitting: Physician Assistant

## 2023-11-02 VITALS — BP 138/62 | HR 71 | Ht 73.0 in | Wt 171.6 lb

## 2023-11-02 DIAGNOSIS — I4821 Permanent atrial fibrillation: Secondary | ICD-10-CM

## 2023-11-02 DIAGNOSIS — K219 Gastro-esophageal reflux disease without esophagitis: Secondary | ICD-10-CM | POA: Diagnosis not present

## 2023-11-02 DIAGNOSIS — I97638 Postprocedural hematoma of a circulatory system organ or structure following other circulatory system procedure: Secondary | ICD-10-CM | POA: Diagnosis not present

## 2023-11-02 DIAGNOSIS — E041 Nontoxic single thyroid nodule: Secondary | ICD-10-CM | POA: Diagnosis not present

## 2023-11-02 DIAGNOSIS — I513 Intracardiac thrombosis, not elsewhere classified: Secondary | ICD-10-CM

## 2023-11-02 DIAGNOSIS — S301XXA Contusion of abdominal wall, initial encounter: Secondary | ICD-10-CM | POA: Diagnosis not present

## 2023-11-02 DIAGNOSIS — I480 Paroxysmal atrial fibrillation: Secondary | ICD-10-CM | POA: Diagnosis not present

## 2023-11-02 DIAGNOSIS — I5032 Chronic diastolic (congestive) heart failure: Secondary | ICD-10-CM

## 2023-11-02 DIAGNOSIS — I10 Essential (primary) hypertension: Secondary | ICD-10-CM | POA: Diagnosis not present

## 2023-11-02 DIAGNOSIS — Z952 Presence of prosthetic heart valve: Secondary | ICD-10-CM | POA: Diagnosis not present

## 2023-11-02 DIAGNOSIS — I340 Nonrheumatic mitral (valve) insufficiency: Secondary | ICD-10-CM | POA: Diagnosis not present

## 2023-11-02 DIAGNOSIS — Z7901 Long term (current) use of anticoagulants: Secondary | ICD-10-CM | POA: Diagnosis not present

## 2023-11-02 DIAGNOSIS — E7800 Pure hypercholesterolemia, unspecified: Secondary | ICD-10-CM

## 2023-11-02 DIAGNOSIS — D6869 Other thrombophilia: Secondary | ICD-10-CM | POA: Diagnosis not present

## 2023-11-02 MED ORDER — AMOXICILLIN 500 MG PO TABS: 2000.0000 mg | ORAL_TABLET | ORAL | 12 refills | Status: AC

## 2023-11-02 MED ORDER — PANTOPRAZOLE SODIUM 40 MG PO TBEC: 40.0000 mg | DELAYED_RELEASE_TABLET | Freq: Every day | ORAL | 11 refills | Status: DC

## 2023-11-02 NOTE — Progress Notes (Addendum)
 HEART AND VASCULAR CENTER   MULTIDISCIPLINARY HEART VALVE CLINIC                                     Cardiology Office Note:    Date:  11/02/2023   ID:  Henry Holland, DOB [birth year], MRN [id number]  PCP:  Cleatis Polka., MD  [facility] Cardiologist:  Jodelle Red, MD / Dr. Clifton James & Dr. Leafy Ro (TAVR)  [facility] Electrophysiologist:  None   Referring MD: Cleatis Polka., MD   [facility] s/p TAVR   History of Present Illness:    Henry Holland is a 89 y.o. male with a hx of persistent atrial fibrillation on warfarin (pt preference), HTN, HLD, prostate cancer in 2012 s/p XRT and pLFLG severe aortic stenosis s/p TAVR (10/23/23) who presents to clinic for follow up.  He had prostate cancer in 2012 treated with radiation therapy. He has been followed for moderate aortic stenosis. Echo 07/17/23 showed EF 60%, mild RVE, severe pLFLG AS with mean grad 26 mmHg, peak grad 46.8 mmHg, AVA 0.76 cm2, DVI 0.24, SVI 24, moderate MAC with mild-mod MR. [facility] 09/19/23 showed mild non-obstructive CAD and mild elevation of PA pressure. Pre TAVR CTs showed a LAA thrombus and he was bridged with Lovenox for TAVR. S/p TAVR with a 26 mm Edwards Sapien 3 Ultra Resilia THV via the TF approach on 10/24/23. Post operative echo showed EF 55%, mod concentric LVH, mod RVE, mod pulm HTN, normally functioning TAVR with a mean gradient of 9 mmHg and no PVL as well as severe MAC, mild MS and severe MR. LVEDP noted to be elevated ~23 and treated with one dose of IV Lasix 20mg . His magnesium was also repleted.   Today the patient presents to clinic for follow up. Here with his wife. Right sciatica that has been flaring but he also has a groin lump that has been hurting him. Not sure which is which. No CP or SOB. No LE edema, orthopnea or PND. No dizziness or syncope. No blood in stool or urine. No palpitations. Goes to [employer] 3-4 times a week. He doesn't have the energy he wished he had. Wonders when that will  get better. Also having some GERD relieved with Tums.   Past Medical History:  Diagnosis Date   Arthritis    Chronic atrial fibrillation (HCC)    History of radiation therapy    Hyperlipidemia    Hypertension    Prostate cancer (HCC)    Severe aortic stenosis      Current Medications: Current Meds  Medication Sig   amoxicillin (AMOXIL) 500 MG tablet Take 4 tablets (2,000 mg total) by mouth as directed. 1 hour prior to dental work including cleanings   ascorbic acid (VITAMIN C) 500 MG tablet Take 500 mg by mouth daily.   Cholecalciferol (VITAMIN D3) 1000 units CAPS Take 1,000 Units by mouth daily.   diltiazem (CARDIZEM CD) 120 MG 24 hr capsule Take 1 capsule (120 mg total) by mouth daily.   glucose blood (ONETOUCH VERIO) test strip Use to check blood sugar 2 times a day as directed Dx E11.4   Lancets (ONETOUCH ULTRASOFT) lancets use for OneTouch Ultra meter to test up to bid   losartan (COZAAR) 100 MG tablet Take 100 mg by mouth daily.     metFORMIN (GLUCOPHAGE-XR) 500 MG 24 hr tablet Take 500 mg by mouth 2 (  two) times daily with a meal.   pantoprazole (PROTONIX) 40 MG tablet Take 1 tablet (40 mg total) by mouth daily.   rosuvastatin (CRESTOR) 5 MG tablet Take 5 mg by mouth daily.     warfarin (JANTOVEN) 5 MG tablet Take 7.5-10 mg by mouth See admin instructions. Take with 10 mg Mon Wed Fri and Sat, and 7.5 mg Sun Tues, and Thurs      ROS:   Please see the history of present illness.    All other systems reviewed and are negative.  EKGs   EKG Interpretation Date/Time:  Friday November 02 2023 09:52:53 EST Ventricular Rate:  71 PR Interval:    QRS Duration:  114 QT Interval:  424 QTC Calculation: 460 R Axis:   77  Text Interpretation: Atrial fibrillation Incomplete right bundle branch block ST & T wave abnormality, consider inferior ischemia Confirmed by Cline Crock [phone]) on 11/02/2023 10:08:42 AM   Risk Assessment/Calculations:    CHA2DS2-VASc Score =     This  indicates a  % annual risk of stroke. The patient's score is based upon:           Physical Exam:    VS:  BP 138/62 (BP Location: Right Arm)   Pulse 71   Ht 6\' 1"  (1.854 m)   Wt 171 lb 9.6 oz (77.8 kg)   SpO2 98%   BMI 22.64 kg/m     Wt Readings from Last 3 Encounters:  11/02/23 171 lb 9.6 oz (77.8 kg)  10/27/23 165 lb (74.8 kg)  10/24/23 167 lb (75.8 kg)     GEN: Well nourished, well developed in no acute distress NECK: No JVD CARDIAC: irreg irreg, 2/6 holosystolic murmur at apex. No rubs, gallops RESPIRATORY:  Clear to auscultation without rales, wheezing or rhonchi  ABDOMEN: Soft, non-tender, non-distended EXTREMITIES:  No edema; No deformity.  Groin sites with right sided ecchymosis and 2.5 cm by 1.25 cm protrusion with no bruit.       ASSESSMENT:    1. S/P TAVR (transcatheter aortic valve replacement)   2. Hematoma of groin, initial encounter   3. Chronic diastolic CHF (congestive heart failure) (HCC)   4. Nonrheumatic mitral valve regurgitation   5. Permanent atrial fibrillation (HCC)   6. Primary hypertension   7. Pure hypercholesterolemia   8. Thrombus of left atrial appendage   9. Thyroid nodule     PLAN:    In order of problems listed above:  Severe AS s/p TAVR:  -- Pt doing okay s/p TAVR.  -- ECG with no HAVB.  -- Right groin with hematoma vs pseudoaneurysm (see below) -- SBE prophylaxis discussed; I have RX'd amoxicillin.  -- Continue Coumadin. -- Cleared to resume all activities without restriction. -- I will see back for 1 month echo and OV.  Right groin lump: -- See photo above.  -- He has right sided ecchymosis and 2.5 cm by 1.25 cm tender protrusion with no bruit. -- Vascular duplex ordered for today at noon.   Chronic diastolic CHF: -- Appears euvolemic.  -- Not on standing diuretics.   Mitral regurgitation:  -- Echo 10/25/23 with severe MAC, mild MS and severe MR.   Persistent atrial fibrillation: -- Rate well controlled.   -- Continue Cardizem CD 120mg  daily. -- Continue Coumadin with Lovenox bridge (still on this as INR was 1.3 on recent recheck with PCP).   HTN: -- BP well controlled today. -- Continue losartan 100mg  daily and Cardizem CD 120mg  daily.  HLD:  -- Continue Crestor 5mg  daily   LAA thrombus:  -- Pre TAVR CTs showed a large left atrial appendage with multiple filling defects likely representing thrombus.  -- Continue on Coumadin with Lovenox until INR therapeutic.    Thyroid nodule:  -- Pre TAVR CTs showed a ~1.6 cm hypoechoic nodule in the left superior thyroid gland.  -- Recommend further evaluation with dedicated thyroid ultrasound. -- This was discussed and ordered today.   GERD -- Rx pantoprazole    Cardiac Rehabilitation Eligibility Assessment  The patient is ready to start cardiac rehabilitation from a cardiac standpoint.     Medication Adjustments/Labs and Tests Ordered: Current medicines are reviewed at length with the patient today.  Concerns regarding medicines are outlined above.  Orders Placed This Encounter  Procedures   US THYROID   EKG 12-Lead   VAS [place] GROIN PSEUDOANEURYSM   Meds ordered this encounter  Medications   amoxicillin (AMOXIL) 500 MG tablet    Sig: Take 4 tablets (2,000 mg total) by mouth as directed. 1 hour prior to dental work including cleanings    Dispense:  12 tablet    Refill:  12    Supervising Provider:   COOPER, MICHAEL [[id number]]   pantoprazole (PROTONIX) 40 MG tablet    Sig: Take 1 tablet (40 mg total) by mouth daily.    Dispense:  30 tablet    Refill:  11    Supervising Provider:   Tonny Bollman [[id number]]    Patient Instructions  Medication Instructions:  Your physician has recommended you make the following change in your medication:   START Amoxicillin 500 mg taking 4 tablets before dental procedures  *If you need a refill on your cardiac medications before your next appointment, please call your pharmacy*   Lab Work: None  ordered today  If you have labs (blood work) drawn today and your tests are completely normal, you will receive your results only by: MyChart Message (if you have MyChart) OR A paper copy in the mail If you have any lab test that is abnormal or we need to change your treatment, we will call you to review the results.   Testing/Procedures: Your physician recommends you have a US of the Right Groin  GO TO OUR OFFICE AT [facility], WHERE [facility], ARRIVE AT 11:45 TODAY  Your physician recommends you have a US Thyroid   Follow-Up: At [facility], you and your health needs are our priority.  As part of our continuing mission to provide you with exceptional heart care, we have created designated Provider Care Teams.  These Care Teams include your primary Cardiologist (physician) and Advanced Practice Providers (APPs -  Physician Assistants and Nurse Practitioners) who all work together to provide you with the care you need, when you need it.  We recommend signing up for the patient portal called "MyChart".  Sign up information is provided on this After Visit Summary.  MyChart is used to connect with patients for Virtual Visits (Telemedicine).  Patients are able to view lab/test results, encounter notes, upcoming appointments, etc.  Non-urgent messages can be sent to your provider as well.   To learn more about what you can do with MyChart, go to [email or web address].    Your next appointment:   Already scheduled   Provider:   Carlean Jews, PA-C    Other Instructions     1st Floor: - Lobby - Registration  - Pharmacy  - Lab - Cafe  2nd Floor: -  PV Lab - Diagnostic Testing (echo, CT, nuclear med)  3rd Floor: - Vacant  4th Floor: - TCTS (cardiothoracic surgery) - AFib Clinic - Structural Heart Clinic - Vascular Surgery  - Vascular Ultrasound  5th Floor: - HeartCare Cardiology (general and EP) - Clinical Pharmacy for coumadin, hypertension, lipid,  weight-loss medications, and med management appointments    Valet parking services will be available as well.         Signed, Cline Crock, PA-C  11/02/2023 12:37 PM    [facility]

## 2023-11-02 NOTE — Patient Instructions (Addendum)
 Medication Instructions:  Your physician has recommended you make the following change in your medication:   START Amoxicillin 500 mg taking 4 tablets before dental procedures  *If you need a refill on your cardiac medications before your next appointment, please call your pharmacy*   Lab Work: None ordered today  If you have labs (blood work) drawn today and your tests are completely normal, you will receive your results only by: MyChart Message (if you have MyChart) OR A paper copy in the mail If you have any lab test that is abnormal or we need to change your treatment, we will call you to review the results.   Testing/Procedures: Your physician recommends you have a US of the Right Groin  GO TO OUR OFFICE AT [facility], WHERE [facility], ARRIVE AT 11:45 TODAY  Your physician recommends you have a US Thyroid   Follow-Up: At [facility], you and your health needs are our priority.  As part of our continuing mission to provide you with exceptional heart care, we have created designated Provider Care Teams.  These Care Teams include your primary Cardiologist (physician) and Advanced Practice Providers (APPs -  Physician Assistants and Nurse Practitioners) who all work together to provide you with the care you need, when you need it.  We recommend signing up for the patient portal called "MyChart".  Sign up information is provided on this After Visit Summary.  MyChart is used to connect with patients for Virtual Visits (Telemedicine).  Patients are able to view lab/test results, encounter notes, upcoming appointments, etc.  Non-urgent messages can be sent to your provider as well.   To learn more about what you can do with MyChart, go to [email or web address].    Your next appointment:   Already scheduled   Provider:   Carlean Jews, PA-C    Other Instructions     1st Floor: - Lobby - Registration  - Pharmacy  - Lab - Cafe  2nd Floor: - PV Lab -  Diagnostic Testing (echo, CT, nuclear med)  3rd Floor: - Vacant  4th Floor: - TCTS (cardiothoracic surgery) - AFib Clinic - Structural Heart Clinic - Vascular Surgery  - Vascular Ultrasound  5th Floor: - HeartCare Cardiology (general and EP) - Clinical Pharmacy for coumadin, hypertension, lipid, weight-loss medications, and med management appointments    Valet parking services will be available as well.

## 2023-11-05 DIAGNOSIS — D6869 Other thrombophilia: Secondary | ICD-10-CM | POA: Diagnosis not present

## 2023-11-05 DIAGNOSIS — I480 Paroxysmal atrial fibrillation: Secondary | ICD-10-CM | POA: Diagnosis not present

## 2023-11-05 DIAGNOSIS — Z7901 Long term (current) use of anticoagulants: Secondary | ICD-10-CM | POA: Diagnosis not present

## 2023-11-06 ENCOUNTER — Telehealth (HOSPITAL_COMMUNITY): Payer: Self-pay

## 2023-11-06 NOTE — Telephone Encounter (Signed)
 Called patient to see if he is interested in the Cardiac Rehab Program. Patient expressed interest. Explained scheduling process and went over insurance, patient verbalized understanding. Will contact patient for scheduling once f/u has been completed.

## 2023-11-06 NOTE — Telephone Encounter (Signed)
 Pt insurance is active and benefits verified through [facility]. Co-pay $40.00, DED $0.00/$0.00 met, out of pocket $4,150.00/$963.77 met, co-insurance 0%. No pre-authorization required. Passport, 11/06/23 @ 1:22PM, REF#[id number]   How many CR sessions are covered? (36 visits for TCR, 72 visits for ICR)72 Is this a lifetime maximum or an annual maximum? Annual Has the member used any of these services to date? No Is there a time limit (weeks/months) on start of program and/or program completion? No     Will contact patient to see if he is interested in the Cardiac Rehab Program. If interested, patient will need to complete follow up appt. Once completed, patient will be contacted for scheduling upon review by the RN Navigator.

## 2023-11-07 ENCOUNTER — Ambulatory Visit (HOSPITAL_BASED_OUTPATIENT_CLINIC_OR_DEPARTMENT_OTHER)
Admission: RE | Admit: 2023-11-07 | Discharge: 2023-11-07 | Disposition: A | Discharge status: Home / Self Care | Source: Ambulatory Visit | Attending: Physician Assistant | Admitting: Physician Assistant

## 2023-11-07 ENCOUNTER — Telehealth (HOSPITAL_COMMUNITY): Payer: Self-pay

## 2023-11-07 DIAGNOSIS — E041 Nontoxic single thyroid nodule: Secondary | ICD-10-CM | POA: Diagnosis not present

## 2023-11-07 NOTE — Telephone Encounter (Signed)
 Called patient regarding getting scheduled. He expressed interest in doing 2 days a week but was still unsure, said he would call us back to schedule.

## 2023-11-09 ENCOUNTER — Telehealth (HOSPITAL_COMMUNITY): Payer: Self-pay

## 2023-11-09 NOTE — Telephone Encounter (Signed)
 Called and spoke with pt in regards to CR, pt stated he is not interested at this time.   Closed referral

## 2023-11-12 ENCOUNTER — Telehealth: Payer: Self-pay | Admitting: Physician Assistant

## 2023-11-12 NOTE — Telephone Encounter (Signed)
 Pt calling requesting a cb with thyroid US results

## 2023-11-12 NOTE — Telephone Encounter (Signed)
 Pt aware test has not been read yet, that I can see. Aware forwarding to provider and that she or RN would call with findings/results. Patient verbalized understanding and agreeable to plan.

## 2023-11-13 DIAGNOSIS — I480 Paroxysmal atrial fibrillation: Secondary | ICD-10-CM | POA: Diagnosis not present

## 2023-11-13 DIAGNOSIS — D6869 Other thrombophilia: Secondary | ICD-10-CM | POA: Diagnosis not present

## 2023-11-13 DIAGNOSIS — Z7901 Long term (current) use of anticoagulants: Secondary | ICD-10-CM | POA: Diagnosis not present

## 2023-11-13 NOTE — Telephone Encounter (Signed)
 Please review and advise.

## 2023-11-13 NOTE — Progress Notes (Unsigned)
 HEART AND VASCULAR CENTER   MULTIDISCIPLINARY HEART VALVE CLINIC                                     Cardiology Office Note:    Date:  11/14/2023   ID:  Henry Holland, DOB 03-02-1933, MRN [id number]  PCP:  Cleatis Polka., MD  [facility] HeartCare Cardiologist:  Jodelle Red, MD /  Dr. Clifton James & Dr. Leafy Ro (TAVR)  [facility] HeartCare Electrophysiologist:  None   Referring MD: Cleatis Polka., MD   CC: Groin oozing, left arm pain, reflux.  History of Present Illness:    Henry Holland is a 89 y.o. male with a hx of persistent atrial fibrillation on warfarin (pt preference), HTN, HLD, prostate cancer in 2012 s/p XRT and pLFLG severe aortic stenosis s/p TAVR (10/23/23) who presents to clinic for evaluation of post op groin issues, left arm pain and worsening reflux.    He had prostate cancer in 2012 treated with radiation therapy. He has been followed for moderate aortic stenosis. Echo 07/17/23 showed EF 60%, mild RVE, severe pLFLG AS with mean grad 26 mmHg, peak grad 46.8 mmHg, AVA 0.76 cm2, DVI 0.24, SVI 24, moderate MAC with mild-mod MR. [facility] 09/19/23 showed mild non-obstructive CAD and mild elevation of PA pressure. Pre TAVR CTs showed a LAA thrombus and he was bridged with Lovenox for TAVR. S/p TAVR with a 26 mm Edwards Sapien 3 Ultra Resilia THV via the TF approach on 10/24/23. Post operative echo showed EF 55%, mod concentric LVH, mod RVE, mod pulm HTN, normally functioning TAVR with a mean gradient of 9 mmHg and no PVL as well as severe MAC, mild MS and severe MR. LVEDP noted to be elevated ~23 and treated with one dose of IV Lasix 20mg . His magnesium was also repleted. Discharged on Lovenox bridge.   He was seen for post hospital follow up on 11/02/23. He reported low energy, groin pain, and GERD. He had a lump on his groin and follow up vascular US was c/w hematoma. He was given a Rx for pantoprazole to try for GERD.  He called into the office 11/13/23 to report groin oozing, persistent  swelling, left arm pain and worsening GERD and he was added onto my schedule for evaluation.   Today the patient presents to clinic for follow up. Here with his wife, Henry Holland. He is feeling better. He had not been taking his pantoprazole and tried it last night with improvement in GERD symptoms. Denies having left arm pain now. Has been working out at [employer] with no chest pain or SOB. Still has some fatigue. Had a fall 1.5 weeks ago and hit knee caps and has had worsening sciatic pain since that time. Right groin also a little tender with some mild bleeding. Swelling has gone down a little.    Past Medical History:  Diagnosis Date   Arthritis    Chronic atrial fibrillation (HCC)    History of radiation therapy    Hyperlipidemia    Hypertension    Prostate cancer (HCC)    Severe aortic stenosis      Current Medications: Current Meds  Medication Sig   amoxicillin (AMOXIL) 500 MG tablet Take 4 tablets (2,000 mg total) by mouth as directed. 1 hour prior to dental work including cleanings   ascorbic acid (VITAMIN C) 500 MG tablet Take 500 mg by mouth daily.  Cholecalciferol (VITAMIN D3) 1000 units CAPS Take 1,000 Units by mouth daily.   diltiazem (CARDIZEM CD) 120 MG 24 hr capsule Take 1 capsule (120 mg total) by mouth daily.   glucose blood (ONETOUCH VERIO) test strip Use to check blood sugar 2 times a day as directed Dx E11.4   Lancets (ONETOUCH ULTRASOFT) lancets use for OneTouch Ultra meter to test up to bid   losartan (COZAAR) 100 MG tablet Take 100 mg by mouth daily.     metFORMIN (GLUCOPHAGE-XR) 500 MG 24 hr tablet Take 500 mg by mouth 2 (two) times daily with a meal.   rosuvastatin (CRESTOR) 5 MG tablet Take 5 mg by mouth daily.     warfarin (JANTOVEN) 5 MG tablet Take 7.5-10 mg by mouth See admin instructions. Take with 10 mg Mon Wed Fri and Sat, and 7.5 mg Sun Tues, and Thurs      ROS:   Please see the history of present illness.    All other systems reviewed and are  negative.  EKGs   EKG Interpretation Date/Time:  Wednesday November 14 2023 14:19:54 EDT Ventricular Rate:  72 PR Interval:    QRS Duration:  114 QT Interval:  428 QTC Calculation: 468 R Axis:   72  Text Interpretation: Atrial fibrillation Incomplete right bundle branch block Marked ST abnormality, possible inferior subendocardial injury When compared with ECG of 02-Nov-2023 09:52, No significant change was found Confirmed by Cline Crock [phone]) on 11/14/2023 2:39:58 PM   Risk Assessment/Calculations:    CHA2DS2-VASc Score = 4   This indicates a 4.8% annual risk of stroke. The patient's score is based upon: CHF History: 1 HTN History: 1 Diabetes History: 0 Stroke History: 0 Vascular Disease History: 0 Age Score: 2 Gender Score: 0          Physical Exam:    VS:  BP (!) 130/58 (BP Location: Right Arm)   Pulse 72   Ht 6\' 2"  (1.88 m)   Wt 173 lb 3.2 oz (78.6 kg)   SpO2 99%   BMI 22.24 kg/m     Wt Readings from Last 3 Encounters:  11/14/23 173 lb 3.2 oz (78.6 kg)  11/02/23 171 lb 9.6 oz (77.8 kg)  10/27/23 165 lb (74.8 kg)     GEN: Well nourished, well developed in no acute distress NECK: No JVD CARDIAC: irreg irreg, 2/6 holosystolic murmur at apex. no rubs, gallops RESPIRATORY:  Clear to auscultation without rales, wheezing or rhonchi  ABDOMEN: Soft, non-tender, non-distended EXTREMITIES:  No edema; No deformity. Right groin with persistent lump and small scab. Non tender to palpation. Small amount of blood on bandage.   ASSESSMENT:    1. Hematoma of groin, initial encounter   2. Left arm pain   3. Gastroesophageal reflux disease, unspecified whether esophagitis present   4. S/P TAVR (transcatheter aortic valve replacement)   5. Chronic diastolic CHF (congestive heart failure) (HCC)   6. Nonrheumatic mitral valve regurgitation   7. Permanent atrial fibrillation (HCC)   8. Primary hypertension   9. Pure hypercholesterolemia   10. Thrombus of left  atrial appendage   11. Thyroid nodule     PLAN:    In order of problems listed above:  Right groin hematoma: -- Vascular duplex 11/02/23 c/w hematoma.  -- Persistent protrusion but improved when compared to previous exam. Has mild discomfort but not tender to palpation.  -- Scabbed over with no s/s infection.  -- Discussed sending him to vascular surgery vs conservative management. He  elected to continue watching it for now.   GERD -- Was non complaint with pantoprazole 40mg  daily.  -- He took one dose yesterday with improvement.  -- Encouraged him to take this daily for now.   Left arm pain: -- Now denying this.  -- Works out at [employer] with no exertional pain. -- ECG with no acute ischemia. He has non specific ST/TW abnormalities, similar to previous tracings.  -- Recent cath with mild non obst CAD. -- I do not think this is cardiac related.   S/p TAVR: -- Plan for regular follow up on 4/4 with an echo.    Chronic diastolic CHF: -- Appears euvolemic.  -- Not on standing diuretics.   Mitral regurgitation:  -- Echo 10/25/23 with severe MAC, mild MS and severe MR.   Persistent atrial fibrillation: -- Rate well controlled.  -- Continue Cardizem CD 120mg  daily. -- Continue Coumadin. No longer on Lovenox.    HTN: -- BP well controlled today. -- Continue losartan 100mg  daily and Cardizem CD 120mg  daily.   HLD:  -- Continue Crestor 5mg  daily   LAA thrombus:  -- Pre TAVR CTs showed a large left atrial appendage with multiple filling defects likely representing thrombus.  -- Continue on Coumadin.   Thyroid nodule:  -- Pre TAVR CTs showed a ~1.6 cm hypoechoic nodule in the left superior thyroid gland.  -- Thyroid US 11/07/23 showed a 1.3 cm left superior thyroid nodule (TI-RADS category 5) meeting criteria for fine-needle aspiration. -- FNA ordered today.    Medication Adjustments/Labs and Tests Ordered: Current medicines are reviewed at length with the patient today.   Concerns regarding medicines are outlined above.  Orders Placed This Encounter  Procedures   [place] FINE NEEDLE ASP 1ST LESION   EKG 12-Lead   Meds ordered this encounter  Medications   pantoprazole (PROTONIX) 40 MG tablet    Sig: Take 1 tablet (40 mg total) by mouth daily.    Dispense:  30 tablet    Refill:  2    Patient Instructions  Medication Instructions:  Your physician recommends that you continue on your current medications as directed. Please refer to the Current Medication list given to you today. TAKE: pantoprazole (Protonix) 40 mg by mouth once daily  *If you need a refill on your cardiac medications before your next appointment, please call your pharmacy*   Lab Work: NONE If you have labs (blood work) drawn today and your tests are completely normal, you will receive your results only by: MyChart Message (if you have MyChart) OR A paper copy in the mail If you have any lab test that is abnormal or we need to change your treatment, we will call you to review the results.   Testing/Procedures: NONE   Follow-Up:As scheduled  At [facility], you and your health needs are our priority.  As part of our continuing mission to provide you with exceptional heart care, we have created designated Provider Care Teams.  These Care Teams include your primary Cardiologist (physician) and Advanced Practice Providers (APPs -  Physician Assistants and Nurse Practitioners) who all work together to provide you with the care you need, when you need it.    Provider:   Carlean Jews, PA-C           Signed, Cline Crock, PA-C  11/14/2023 3:00 PM    [facility]

## 2023-11-13 NOTE — Telephone Encounter (Signed)
 Patient called again to follow-up on CT scan results and noted he had swelling and has been having bleeding in his groin area after bandage was removed.  Patient stated he is also having indigestion and wants a call back to discuss next steps.

## 2023-11-14 ENCOUNTER — Ambulatory Visit: Attending: Cardiology | Admitting: Physician Assistant

## 2023-11-14 VITALS — BP 130/58 | HR 72 | Ht 74.0 in | Wt 173.2 lb

## 2023-11-14 DIAGNOSIS — E7800 Pure hypercholesterolemia, unspecified: Secondary | ICD-10-CM

## 2023-11-14 DIAGNOSIS — S301XXA Contusion of abdominal wall, initial encounter: Secondary | ICD-10-CM

## 2023-11-14 DIAGNOSIS — I10 Essential (primary) hypertension: Secondary | ICD-10-CM

## 2023-11-14 DIAGNOSIS — K219 Gastro-esophageal reflux disease without esophagitis: Secondary | ICD-10-CM

## 2023-11-14 DIAGNOSIS — S301XXD Contusion of abdominal wall, subsequent encounter: Secondary | ICD-10-CM

## 2023-11-14 DIAGNOSIS — I513 Intracardiac thrombosis, not elsewhere classified: Secondary | ICD-10-CM

## 2023-11-14 DIAGNOSIS — I4821 Permanent atrial fibrillation: Secondary | ICD-10-CM

## 2023-11-14 DIAGNOSIS — M79602 Pain in left arm: Secondary | ICD-10-CM

## 2023-11-14 DIAGNOSIS — I340 Nonrheumatic mitral (valve) insufficiency: Secondary | ICD-10-CM

## 2023-11-14 DIAGNOSIS — E041 Nontoxic single thyroid nodule: Secondary | ICD-10-CM | POA: Diagnosis not present

## 2023-11-14 DIAGNOSIS — I5032 Chronic diastolic (congestive) heart failure: Secondary | ICD-10-CM | POA: Diagnosis not present

## 2023-11-14 DIAGNOSIS — Z952 Presence of prosthetic heart valve: Secondary | ICD-10-CM | POA: Diagnosis not present

## 2023-11-14 MED ORDER — PANTOPRAZOLE SODIUM 40 MG PO TBEC: 40.0000 mg | DELAYED_RELEASE_TABLET | Freq: Every day | ORAL | 2 refills | Status: AC

## 2023-11-14 NOTE — Patient Instructions (Signed)
 Medication Instructions:  Your physician recommends that you continue on your current medications as directed. Please refer to the Current Medication list given to you today. TAKE: pantoprazole (Protonix) 40 mg by mouth once daily  *If you need a refill on your cardiac medications before your next appointment, please call your pharmacy*   Lab Work: NONE If you have labs (blood work) drawn today and your tests are completely normal, you will receive your results only by: MyChart Message (if you have MyChart) OR A paper copy in the mail If you have any lab test that is abnormal or we need to change your treatment, we will call you to review the results.   Testing/Procedures: NONE   Follow-Up:As scheduled  At [facility], you and your health needs are our priority.  As part of our continuing mission to provide you with exceptional heart care, we have created designated Provider Care Teams.  These Care Teams include your primary Cardiologist (physician) and Advanced Practice Providers (APPs -  Physician Assistants and Nurse Practitioners) who all work together to provide you with the care you need, when you need it.    Provider:   Carlean Jews, PA-C

## 2023-11-20 ENCOUNTER — Encounter (HOSPITAL_COMMUNITY): Payer: Self-pay | Admitting: *Deleted

## 2023-11-21 ENCOUNTER — Telehealth (HOSPITAL_COMMUNITY): Payer: Self-pay

## 2023-11-21 ENCOUNTER — Encounter (HOSPITAL_COMMUNITY)
Admission: RE | Admit: 2023-11-21 | Discharge: 2023-11-21 | Disposition: A | Discharge status: Home / Self Care | Source: Ambulatory Visit | Attending: Cardiology | Admitting: Cardiology

## 2023-11-21 VITALS — BP 122/70 | HR 65 | Ht 74.0 in | Wt 176.6 lb

## 2023-11-21 DIAGNOSIS — Z952 Presence of prosthetic heart valve: Secondary | ICD-10-CM | POA: Diagnosis present

## 2023-11-21 NOTE — Telephone Encounter (Signed)
  Called pt to confirm appt for 11/21/23 at 1330. Gave pt instructions for appt, what to wear, office address, eating/taking meds before, and if sick to call and reschedule. Pt voiced understanding, all questions answered.   Health history completed? Yes   Jonna Coup, MS, ACSM-CEP 11/21/2023 8:52 AM

## 2023-11-21 NOTE — Progress Notes (Signed)
 Cardiac Rehab Medication Review   Does the patient  feel that his/her medications are working for him/her? Yes    Has the patient been experiencing any side effects to the medications prescribed? No  Does the patient measure his/her own blood pressure or blood glucose at home?   Yes  Does the patient have any problems obtaining medications due to transportation or finances?   No  Understanding of regimen: good Understanding of indications: good Potential of compliance: good    Comments: Garey has a good understanding of his medications/regime. He checks his BP occasionally and his CBGs every day.    Jonna Coup, MS, ACSM-CEP 11/21/2023 2:21 PM

## 2023-11-21 NOTE — Progress Notes (Signed)
 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 1 hour prior to dental work including cleanings, Disp: 12 tablet, Rfl: 12   ascorbic acid (VITAMIN C) 500 MG tablet, Take 500 mg by mouth daily., Disp: , Rfl:    Cholecalciferol (VITAMIN D3) 1000 units CAPS, Take 1,000 Units by mouth daily., Disp: , Rfl:    diltiazem (CARDIZEM CD) 120 MG 24 hr capsule, Take 1 capsule (120 mg total) by mouth daily., Disp: 90 capsule, Rfl: 3   glucose blood (ONETOUCH VERIO) test strip, Use to check blood sugar 2 times a day as directed Dx E11.4, Disp: , Rfl:    Lancets (ONETOUCH ULTRASOFT) lancets, use for OneTouch Ultra meter to test up to bid, Disp: , Rfl:    losartan (COZAAR) 100 MG tablet, Take 100 mg by mouth daily.  , Disp: , Rfl:    metFORMIN (GLUCOPHAGE-XR) 500 MG 24 hr tablet, Take 500 mg by mouth 2 (two) times daily with a meal., Disp: , Rfl:    pantoprazole (PROTONIX) 40 MG tablet, Take 1 tablet (40 mg total) by mouth daily., Disp: 30 tablet, Rfl: 2   rosuvastatin (CRESTOR) 5 MG tablet, Take 5 mg by mouth daily.  , Disp: , Rfl:    warfarin (JANTOVEN) 5 MG tablet, Take 7.5-10 mg by mouth See admin instructions. Take with 10 mg Mon Wed Fri and Sat, and 7.5 mg Sun Tues, and Thurs, Disp: , Rfl:   Past Medical History: Past Medical History:  Diagnosis Date    Arthritis    Chronic atrial fibrillation (HCC)    History of radiation therapy    Hyperlipidemia    Hypertension    Prostate cancer (HCC)    Severe aortic stenosis     Tobacco Use: Social History   Tobacco Use  Smoking Status Never  Smokeless Tobacco Never    Labs: Review Flowsheet       Latest Ref Rng & Units 01/02/2011 09/19/2023 10/23/2023  Labs for ITP Cardiac and Pulmonary Rehab  Cholestrol 0 - 200 mg/dL 147  - -  LDL (calc) 0 - 99 mg/dL 63  - -  HDL-C >82.95 mg/dL 62.13  - -  Trlycerides 0.0 - 149.0 mg/dL 086.5  - -  PH, Arterial 7.35 - 7.45 - 7.384  -  PCO2 arterial 32 - 48 mmHg - 37.5  -  Bicarbonate 20.0 - 28.0 mmol/L - 22.4  23.4  24.5  -  TCO2 22 - 32 mmol/L - 24  25  26  24    Acid-base deficit 0.0 - 2.0 mmol/L - 2.0  2.0  1.0  -  O2 Saturation % - 99  71  70  -    Details       Multiple values from one day are sorted in reverse-chronological order         Capillary Blood Glucose:  Lab Results  Component Value Date   GLUCAP 165 (H) 10/24/2023   GLUCAP 297 (H) 10/24/2023   GLUCAP 150 (H) 10/24/2023   GLUCAP 188 (H) 10/23/2023   GLUCAP 124 (H) 10/23/2023     Exercise Target Goals: Exercise Program Goal: Individual exercise prescription set using results from initial 6 min walk test and THRR while considering  patient's activity barriers and safety.   Exercise Prescription Goal: Initial exercise prescription builds to 30-45 minutes a day of aerobic activity, 2-3 days per week.  Home exercise guidelines will be given to patient during program as part of exercise prescription that the participant will acknowledge.  Activity Barriers & Risk Stratification:  Activity Barriers & Cardiac Risk Stratification - 11/21/23 1423       Activity Barriers & Cardiac Risk Stratification   Activity Barriers Joint Problems;Deconditioning;Back Problems;Incisional Pain;Decreased Ventricular Function;Balance Concerns;Neck/Spine Problems;History of Falls    Cardiac  Risk Stratification High   <5 METs on            6 Minute Walk:  6 Minute Walk     Row Name 11/21/23 1551         6 Minute Walk   Phase Initial     Distance 1140 feet     Walk Time 6 minutes     # of Rest Breaks 1  3:00-3:30 Over THR     MPH 2.16     METS 2.17     RPE 13     Perceived Dyspnea  0     VO2 Peak 7.59     Symptoms Yes (comment)     Comments break from 3:00-3:30 due to over THR. 5/10 R leg chronic pain, resolved with rest     Resting HR 65 bpm     Resting BP 122/70     Resting Oxygen Saturation  97 %     Exercise Oxygen Saturation  during 6 min walk 97 %     Max Ex. HR 113 bpm     Max Ex. BP 132/66     2 Minute Post BP 110/64              Oxygen Initial Assessment:   Oxygen Re-Evaluation:   Oxygen Discharge (Final Oxygen Re-Evaluation):   Initial Exercise Prescription:  Initial Exercise Prescription - 11/21/23 1500       Date of Initial Exercise RX and Referring Provider   Date 11/21/23    Referring Provider Jodelle Red, MD    Expected Discharge Date 02/13/24      Recumbant Bike   Level 1    RPM 40    Watts 12    Minutes 15    METs 2      NuStep   Level 1    SPM 75    Minutes 15    METs 2      Prescription Details   Frequency (times per week) 3    Duration Progress to 30 minutes of continuous aerobic without signs/symptoms of physical distress      Intensity   THRR 40-80% of Max Heartrate 52-104    Ratings of Perceived Exertion 11-13    Perceived Dyspnea 0-4      Progression   Progression Continue progressive overload as per policy without signs/symptoms or physical distress.      Resistance Training   Training Prescription Yes    Weight 2    Reps 10-15  Perform Capillary Blood Glucose checks as needed.  Exercise Prescription Changes:   Exercise Comments:   Exercise Goals and Review:   Exercise Goals     Row Name 11/21/23 1322             Exercise Goals   Increase  Physical Activity Yes       Intervention Develop an individualized exercise prescription for aerobic and resistive training based on initial evaluation findings, risk stratification, comorbidities and participant's personal goals.;Provide advice, education, support and counseling about physical activity/exercise needs.       Expected Outcomes Short Term: Attend rehab on a regular basis to increase amount of physical activity.;Long Term: Exercising regularly at least 3-5 days a week.;Long Term: Add in home exercise to make exercise part of routine and to increase amount of physical activity.       Increase Strength and Stamina Yes       Intervention Develop an individualized exercise prescription for aerobic and resistive training based on initial evaluation findings, risk stratification, comorbidities and participant's personal goals.;Provide advice, education, support and counseling about physical activity/exercise needs.       Expected Outcomes Short Term: Increase workloads from initial exercise prescription for resistance, speed, and METs.;Short Term: Perform resistance training exercises routinely during rehab and add in resistance training at home;Long Term: Improve cardiorespiratory fitness, muscular endurance and strength as measured by increased METs and functional capacity ( )       Able to understand and use rate of perceived exertion (RPE) scale Yes       Intervention Provide education and explanation on how to use RPE scale       Expected Outcomes Short Term: Able to use RPE daily in rehab to express subjective intensity level;Long Term:  Able to use RPE to guide intensity level when exercising independently       Knowledge and understanding of Target Heart Rate Range (THRR) Yes       Intervention Provide education and explanation of THRR including how the numbers were predicted and where they are located for reference       Expected Outcomes Short Term: Able to state/look up THRR;Short  Term: Able to use daily as guideline for intensity in rehab;Long Term: Able to use THRR to govern intensity when exercising independently       Understanding of Exercise Prescription Yes       Intervention Provide education, explanation, and written materials on patient's individual exercise prescription       Expected Outcomes Short Term: Able to explain program exercise prescription;Long Term: Able to explain home exercise prescription to exercise independently                Exercise Goals Re-Evaluation :   Discharge Exercise Prescription (Final Exercise Prescription Changes):   Nutrition:  Target Goals: Understanding of nutrition guidelines, daily intake of sodium 1500mg , cholesterol 200mg , calories 30% from fat and 7% or less from saturated fats, daily to have 5 or more servings of fruits and vegetables.  Biometrics:    Nutrition Therapy Plan and Nutrition Goals:   Nutrition Assessments:  MEDIFICTS Score Key: >=70 Need to make dietary changes  40-70 Heart Healthy Diet <= 40 Therapeutic Level Cholesterol Diet    Picture Your Plate Scores: <29 Unhealthy dietary pattern with much room for improvement. 41-50 Dietary pattern unlikely to meet recommendations for good health and room for improvement. 51-60 More healthful dietary pattern, with some room for improvement.  >60 Healthy dietary pattern, although there may be  some specific behaviors that could be improved.    Nutrition Goals Re-Evaluation:   Nutrition Goals Re-Evaluation:   Nutrition Goals Discharge (Final Nutrition Goals Re-Evaluation):   Psychosocial: Target Goals: Acknowledge presence or absence of significant depression and/or stress, maximize coping skills, provide positive support system. Participant is able to verbalize types and ability to use techniques and skills needed for reducing stress and depression.  Initial Review & Psychosocial Screening:  Initial Psych Review & Screening - 11/21/23  1423       Initial Review   Current issues with None Identified      Family Dynamics   Good Support System? Yes   Wife for support   Comments Elijah is frustrated by his continued lack of energy since his TAVR. He'd hoped that the procedure would help with his low energy, however he is still finding some daily tasks difficult. He denies any feelings of anxiety/depression/stress or need for additional support.      Barriers   Psychosocial barriers to participate in program There are no identifiable barriers or psychosocial needs.      Screening Interventions   Interventions Encouraged to exercise;Provide feedback about the scores to participant    Expected Outcomes Short Term goal: Identification and review with participant of any Quality of Life or Depression concerns found by scoring the questionnaire.;Long Term goal: The participant improves quality of Life and PHQ9 Scores as seen by post scores and/or verbalization of changes             Quality of Life Scores:  Quality of Life - 11/21/23 1448       Quality of Life   Select Quality of Life      Quality of Life Scores   Health/Function Pre 22.04 %    Socioeconomic Pre 27.14 %    Psych/Spiritual Pre 27.43 %    Family Pre 28.8 %    GLOBAL Pre 25.39 %            Scores of 19 and below usually indicate a poorer quality of life in these areas.  A difference of  2-3 points is a clinically meaningful difference.  A difference of 2-3 points in the total score of the Quality of Life Index has been associated with significant improvement in overall quality of life, self-image, physical symptoms, and general health in studies assessing change in quality of life.  PHQ-9: Review Flowsheet       11/21/2023  Depression screen PHQ 2/9  Decreased Interest 1  Down, Depressed, Hopeless 0  PHQ - 2 Score 1  Altered sleeping 1  Tired, decreased energy 3  Change in appetite 1  Feeling bad or failure about yourself  0  Trouble  concentrating 0  Moving slowly or fidgety/restless 0  Suicidal thoughts 0  PHQ-9 Score 6  Difficult doing work/chores Not difficult at all   Interpretation of Total Score  Total Score Depression Severity:  1-4 = Minimal depression, 5-9 = Mild depression, 10-14 = Moderate depression, 15-19 = Moderately severe depression, 20-27 = Severe depression   Psychosocial Evaluation and Intervention:   Psychosocial Re-Evaluation:   Psychosocial Discharge (Final Psychosocial Re-Evaluation):   Vocational Rehabilitation: Provide vocational rehab assistance to qualifying candidates.   Vocational Rehab Evaluation & Intervention:  Vocational Rehab - 11/21/23 1426       Initial Vocational Rehab Evaluation & Intervention   Assessment shows need for Vocational Rehabilitation No   Dinh is retired  Education: Education Goals: Education classes will be provided on a weekly basis, covering required topics. Participant will state understanding/return demonstration of topics presented.     Core Videos: Exercise    Move It!  Clinical staff conducted group or individual video education with verbal and written material and guidebook.  Patient learns the recommended Pritikin exercise program. Exercise with the goal of living a long, healthy life. Some of the health benefits of exercise include controlled diabetes, healthier blood pressure levels, improved cholesterol levels, improved heart and lung capacity, improved sleep, and better body composition. Everyone should speak with their doctor before starting or changing an exercise routine.  Biomechanical Limitations Clinical staff conducted group or individual video education with verbal and written material and guidebook.  Patient learns how biomechanical limitations can impact exercise and how we can mitigate and possibly overcome limitations to have an impactful and balanced exercise routine.  Body Composition Clinical staff  conducted group or individual video education with verbal and written material and guidebook.  Patient learns that body composition (ratio of muscle mass to fat mass) is a key component to assessing overall fitness, rather than body weight alone. Increased fat mass, especially visceral belly fat, can put [place] at increased risk for metabolic syndrome, type 2 diabetes, heart disease, and even death. It is recommended to combine diet and exercise (cardiovascular and resistance training) to improve your body composition. Seek guidance from your physician and exercise physiologist before implementing an exercise routine.  Exercise Action Plan Clinical staff conducted group or individual video education with verbal and written material and guidebook.  Patient learns the recommended strategies to achieve and enjoy long-term exercise adherence, including variety, self-motivation, self-efficacy, and positive decision making. Benefits of exercise include fitness, good health, weight management, more energy, better sleep, less stress, and overall well-being.  Medical   Heart Disease Risk Reduction Clinical staff conducted group or individual video education with verbal and written material and guidebook.  Patient learns our heart is our most vital organ as it circulates oxygen, nutrients, white blood cells, and hormones throughout the entire body, and carries waste away. Data supports a plant-based eating plan like the Pritikin Program for its effectiveness in slowing progression of and reversing heart disease. The video provides a number of recommendations to address heart disease.   Metabolic Syndrome and Belly Fat  Clinical staff conducted group or individual video education with verbal and written material and guidebook.  Patient learns what metabolic syndrome is, how it leads to heart disease, and how one can reverse it and keep it from coming back. You have metabolic syndrome if you have 3 of the following 5  criteria: abdominal obesity, high blood pressure, high triglycerides, low HDL cholesterol, and high blood sugar.  Hypertension and Heart Disease Clinical staff conducted group or individual video education with verbal and written material and guidebook.  Patient learns that high blood pressure, or hypertension, is very common in the [place]. Hypertension is largely due to excessive salt intake, but other important risk factors include being overweight, physical inactivity, drinking too much alcohol, smoking, and not eating enough potassium from fruits and vegetables. High blood pressure is a leading risk factor for heart attack, stroke, congestive heart failure, dementia, kidney failure, and premature death. Long-term effects of excessive salt intake include stiffening of the arteries and thickening of heart muscle and organ damage. Recommendations include ways to reduce hypertension and the risk of heart disease.  Diseases of Our Time - Focusing on Diabetes Clinical  staff conducted group or individual video education with verbal and written material and guidebook.  Patient learns why the best way to stop diseases of our time is prevention, through food and other lifestyle changes. Medicine (such as prescription pills and surgeries) is often only a Band-Aid on the problem, not a long-term solution. Most common diseases of our time include obesity, type 2 diabetes, hypertension, heart disease, and cancer. The Pritikin Program is recommended and has been proven to help reduce, reverse, and/or prevent the damaging effects of metabolic syndrome.  Nutrition   Overview of the Pritikin Eating Plan  Clinical staff conducted group or individual video education with verbal and written material and guidebook.  Patient learns about the Pritikin Eating Plan for disease risk reduction. The Pritikin Eating Plan emphasizes a wide variety of unrefined, minimally-processed carbohydrates, like fruits, vegetables,  whole grains, and legumes. Go, Caution, and Stop food choices are explained. Plant-based and lean animal proteins are emphasized. Rationale provided for low sodium intake for blood pressure control, low added sugars for blood sugar stabilization, and low added fats and oils for coronary artery disease risk reduction and weight management.  Calorie Density  Clinical staff conducted group or individual video education with verbal and written material and guidebook.  Patient learns about calorie density and how it impacts the Pritikin Eating Plan. Knowing the characteristics of the food you choose will help you decide whether those foods will lead to weight gain or weight loss, and whether you want to consume more or less of them. Weight loss is usually a side effect of the Pritikin Eating Plan because of its focus on low calorie-dense foods.  Label Reading  Clinical staff conducted group or individual video education with verbal and written material and guidebook.  Patient learns about the Pritikin recommended label reading guidelines and corresponding recommendations regarding calorie density, added sugars, sodium content, and whole grains.  Dining Out - Part 1  Clinical staff conducted group or individual video education with verbal and written material and guidebook.  Patient learns that restaurant meals can be sabotaging because they can be so high in calories, fat, sodium, and/or sugar. Patient learns recommended strategies on how to positively address this and avoid unhealthy pitfalls.  Facts on Fats  Clinical staff conducted group or individual video education with verbal and written material and guidebook.  Patient learns that lifestyle modifications can be just as effective, if not more so, as many medications for lowering your risk of heart disease. A Pritikin lifestyle can help to reduce your risk of inflammation and atherosclerosis (cholesterol build-up, or plaque, in the artery walls).  Lifestyle interventions such as dietary choices and physical activity address the cause of atherosclerosis. A review of the types of fats and their impact on blood cholesterol levels, along with dietary recommendations to reduce fat intake is also included.  Nutrition Action Plan  Clinical staff conducted group or individual video education with verbal and written material and guidebook.  Patient learns how to incorporate Pritikin recommendations into their lifestyle. Recommendations include planning and keeping personal health goals in mind as an important part of their success.  Healthy Mind-Set    Healthy Minds, Bodies, Hearts  Clinical staff conducted group or individual video education with verbal and written material and guidebook.  Patient learns how to identify when they are stressed. Video will discuss the impact of that stress, as well as the many benefits of stress management. Patient will also be introduced to stress management techniques. The way  we think, act, and feel has an impact on our hearts.  How Our Thoughts Can Heal Our Hearts  Clinical staff conducted group or individual video education with verbal and written material and guidebook.  Patient learns that negative thoughts can cause depression and anxiety. This can result in negative lifestyle behavior and serious health problems. Cognitive behavioral therapy is an effective method to help control our thoughts in order to change and improve our emotional outlook.  Additional Videos:  Exercise    Improving Performance  Clinical staff conducted group or individual video education with verbal and written material and guidebook.  Patient learns to use a non-linear approach by alternating intensity levels and lengths of time spent exercising to help burn more calories and lose more body fat. Cardiovascular exercise helps improve heart health, metabolism, hormonal balance, blood sugar control, and recovery from fatigue. Resistance  training improves strength, endurance, balance, coordination, reaction time, metabolism, and muscle mass. Flexibility exercise improves circulation, posture, and balance. Seek guidance from your physician and exercise physiologist before implementing an exercise routine and learn your capabilities and proper form for all exercise.  Introduction to Yoga  Clinical staff conducted group or individual video education with verbal and written material and guidebook.  Patient learns about yoga, a discipline of the coming together of mind, breath, and body. The benefits of yoga include improved flexibility, improved range of motion, better posture and core strength, increased lung function, weight loss, and positive self-image. Yoga's heart health benefits include lowered blood pressure, healthier heart rate, decreased cholesterol and triglyceride levels, improved immune function, and reduced stress. Seek guidance from your physician and exercise physiologist before implementing an exercise routine and learn your capabilities and proper form for all exercise.  Medical   Aging: Enhancing Your Quality of Life  Clinical staff conducted group or individual video education with verbal and written material and guidebook.  Patient learns key strategies and recommendations to stay in good physical health and enhance quality of life, such as prevention strategies, having an advocate, securing a Health Care Proxy and Power of Attorney, and keeping a list of medications and system for tracking them. It also discusses how to avoid risk for bone loss.  Biology of Weight Control  Clinical staff conducted group or individual video education with verbal and written material and guidebook.  Patient learns that weight gain occurs because we consume more calories than we burn (eating more, moving less). Even if your body weight is normal, you may have higher ratios of fat compared to muscle mass. Too much body fat puts you at  increased risk for cardiovascular disease, heart attack, stroke, type 2 diabetes, and obesity-related cancers. In addition to exercise, following the Pritikin Eating Plan can help reduce your risk.  Decoding Lab Results  Clinical staff conducted group or individual video education with verbal and written material and guidebook.  Patient learns that lab test reflects one measurement whose values change over time and are influenced by many factors, including medication, stress, sleep, exercise, food, hydration, pre-existing medical conditions, and more. It is recommended to use the knowledge from this video to become more involved with your lab results and evaluate your numbers to speak with your doctor.   Diseases of Our Time - Overview  Clinical staff conducted group or individual video education with verbal and written material and guidebook.  Patient learns that according to the CDC, 50% to 70% of chronic diseases (such as obesity, type 2 diabetes, elevated lipids, hypertension, and heart  disease) are avoidable through lifestyle improvements including healthier food choices, listening to satiety cues, and increased physical activity.  Sleep Disorders Clinical staff conducted group or individual video education with verbal and written material and guidebook.  Patient learns how good quality and duration of sleep are important to overall health and well-being. Patient also learns about sleep disorders and how they impact health along with recommendations to address them, including discussing with a physician.  Nutrition  Dining Out - Part 2 Clinical staff conducted group or individual video education with verbal and written material and guidebook.  Patient learns how to plan ahead and communicate in order to maximize their dining experience in a healthy and nutritious manner. Included are recommended food choices based on the type of restaurant the patient is visiting.   Fueling a Fish farm manager conducted group or individual video education with verbal and written material and guidebook.  There is a strong connection between our food choices and our health. Diseases like obesity and type 2 diabetes are very prevalent and are in large-part due to lifestyle choices. The Pritikin Eating Plan provides plenty of food and hunger-curbing satisfaction. It is easy to follow, affordable, and helps reduce health risks.  Menu Workshop  Clinical staff conducted group or individual video education with verbal and written material and guidebook.  Patient learns that restaurant meals can sabotage health goals because they are often packed with calories, fat, sodium, and sugar. Recommendations include strategies to plan ahead and to communicate with the manager, chef, or server to help order a healthier meal.  Planning Your Eating Strategy  Clinical staff conducted group or individual video education with verbal and written material and guidebook.  Patient learns about the Pritikin Eating Plan and its benefit of reducing the risk of disease. The Pritikin Eating Plan does not focus on calories. Instead, it emphasizes high-quality, nutrient-rich foods. By knowing the characteristics of the foods, we choose, we can determine their calorie density and make informed decisions.  Targeting Your Nutrition Priorities  Clinical staff conducted group or individual video education with verbal and written material and guidebook.  Patient learns that lifestyle habits have a tremendous impact on disease risk and progression. This video provides eating and physical activity recommendations based on your personal health goals, such as reducing LDL cholesterol, losing weight, preventing or controlling type 2 diabetes, and reducing high blood pressure.  Vitamins and Minerals  Clinical staff conducted group or individual video education with verbal and written material and guidebook.  Patient learns different  ways to obtain key vitamins and minerals, including through a recommended healthy diet. It is important to discuss all supplements you take with your doctor.   Healthy Mind-Set    Smoking Cessation  Clinical staff conducted group or individual video education with verbal and written material and guidebook.  Patient learns that cigarette smoking and tobacco addiction pose a serious health risk which affects millions of people. Stopping smoking will significantly reduce the risk of heart disease, lung disease, and many forms of cancer. Recommended strategies for quitting are covered, including working with your doctor to develop a successful plan.  Culinary   Becoming a Set designer conducted group or individual video education with verbal and written material and guidebook.  Patient learns that cooking at home can be healthy, cost-effective, quick, and puts them in control. Keys to cooking healthy recipes will include looking at your recipe, assessing your equipment needs, planning ahead, making it simple,  choosing cost-effective seasonal ingredients, and limiting the use of added fats, salts, and sugars.  Cooking - Breakfast and Snacks  Clinical staff conducted group or individual video education with verbal and written material and guidebook.  Patient learns how important breakfast is to satiety and nutrition through the entire day. Recommendations include key foods to eat during breakfast to help stabilize blood sugar levels and to prevent overeating at meals later in the day. Planning ahead is also a key component.  Cooking - Educational psychologist conducted group or individual video education with verbal and written material and guidebook.  Patient learns eating strategies to improve overall health, including an approach to cook more at home. Recommendations include thinking of animal protein as a side on your plate rather than center stage and focusing instead on  lower calorie dense options like vegetables, fruits, whole grains, and plant-based proteins, such as beans. Making sauces in large quantities to freeze for later and leaving the skin on your vegetables are also recommended to maximize your experience.  Cooking - Healthy Salads and Dressing Clinical staff conducted group or individual video education with verbal and written material and guidebook.  Patient learns that vegetables, fruits, whole grains, and legumes are the foundations of the Pritikin Eating Plan. Recommendations include how to incorporate each of these in flavorful and healthy salads, and how to create homemade salad dressings. Proper handling of ingredients is also covered. Cooking - Soups and [employer] - Soups and Desserts Clinical staff conducted group or individual video education with verbal and written material and guidebook.  Patient learns that Pritikin soups and desserts make for easy, nutritious, and delicious snacks and meal components that are low in sodium, fat, sugar, and calorie density, while high in vitamins, minerals, and filling fiber. Recommendations include simple and healthy ideas for soups and desserts.   Overview     The Pritikin Solution Program Overview Clinical staff conducted group or individual video education with verbal and written material and guidebook.  Patient learns that the results of the Pritikin Program have been documented in more than 100 articles published in peer-reviewed journals, and the benefits include reducing risk factors for (and, in some cases, even reversing) high cholesterol, high blood pressure, type 2 diabetes, obesity, and more! An overview of the three key pillars of the Pritikin Program will be covered: eating well, doing regular exercise, and having a healthy mind-set.  WORKSHOPS  Exercise: Exercise Basics: Building Your Action Plan Clinical staff led group instruction and group discussion with PowerPoint presentation  and patient guidebook. To enhance the learning environment the use of posters, models and videos may be added. At the conclusion of this workshop, patients will comprehend the difference between physical activity and exercise, as well as the benefits of incorporating both, into their routine. Patients will understand the FITT (Frequency, Intensity, Time, and Type) principle and how to use it to build an exercise action plan. In addition, safety concerns and other considerations for exercise and cardiac rehab will be addressed by the presenter. The purpose of this lesson is to promote a comprehensive and effective weekly exercise routine in order to improve patients' overall level of fitness.   Managing Heart Disease: Your Path to a Healthier Heart Clinical staff led group instruction and group discussion with PowerPoint presentation and patient guidebook. To enhance the learning environment the use of posters, models and videos may be added.At the conclusion of this workshop, patients will understand the anatomy and physiology  of the heart. Additionally, they will understand how Pritikin's three pillars impact the risk factors, the progression, and the management of heart disease.  The purpose of this lesson is to provide a high-level overview of the heart, heart disease, and how the Pritikin lifestyle positively impacts risk factors.  Exercise Biomechanics Clinical staff led group instruction and group discussion with PowerPoint presentation and patient guidebook. To enhance the learning environment the use of posters, models and videos may be added. Patients will learn how the structural parts of their bodies function and how these functions impact their daily activities, movement, and exercise. Patients will learn how to promote a neutral spine, learn how to manage pain, and identify ways to improve their physical movement in order to promote healthy living. The purpose of this lesson is to  expose patients to common physical limitations that impact physical activity. Participants will learn practical ways to adapt and manage aches and pains, and to minimize their effect on regular exercise. Patients will learn how to maintain good posture while sitting, walking, and lifting.  Balance Training and Fall Prevention  Clinical staff led group instruction and group discussion with PowerPoint presentation and patient guidebook. To enhance the learning environment the use of posters, models and videos may be added. At the conclusion of this workshop, patients will understand the importance of their sensorimotor skills (vision, proprioception, and the vestibular system) in maintaining their ability to balance as they age. Patients will apply a variety of balancing exercises that are appropriate for their current level of function. Patients will understand the common causes for poor balance, possible solutions to these problems, and ways to modify their physical environment in order to minimize their fall risk. The purpose of this lesson is to teach patients about the importance of maintaining balance as they age and ways to minimize their risk of falling.  WORKSHOPS   Nutrition:  Fueling a Ship broker led group instruction and group discussion with PowerPoint presentation and patient guidebook. To enhance the learning environment the use of posters, models and videos may be added. Patients will review the foundational principles of the Pritikin Eating Plan and understand what constitutes a serving size in each of the food groups. Patients will also learn Pritikin-friendly foods that are better choices when away from home and review make-ahead meal and snack options. Calorie density will be reviewed and applied to three nutrition priorities: weight maintenance, weight loss, and weight gain. The purpose of this lesson is to reinforce (in a group setting) the key concepts around  what patients are recommended to eat and how to apply these guidelines when away from home by planning and selecting Pritikin-friendly options. Patients will understand how calorie density may be adjusted for different weight management goals.  Mindful Eating  Clinical staff led group instruction and group discussion with PowerPoint presentation and patient guidebook. To enhance the learning environment the use of posters, models and videos may be added. Patients will briefly review the concepts of the Pritikin Eating Plan and the importance of low-calorie dense foods. The concept of mindful eating will be introduced as well as the importance of paying attention to internal hunger signals. Triggers for non-hunger eating and techniques for dealing with triggers will be explored. The purpose of this lesson is to provide patients with the opportunity to review the basic principles of the Pritikin Eating Plan, discuss the value of eating mindfully and how to measure internal cues of hunger and fullness using the Hunger Scale.  Patients will also discuss reasons for non-hunger eating and learn strategies to use for controlling emotional eating.  Targeting Your Nutrition Priorities Clinical staff led group instruction and group discussion with PowerPoint presentation and patient guidebook. To enhance the learning environment the use of posters, models and videos may be added. Patients will learn how to determine their genetic susceptibility to disease by reviewing their family history. Patients will gain insight into the importance of diet as part of an overall healthy lifestyle in mitigating the impact of genetics and other environmental insults. The purpose of this lesson is to provide patients with the opportunity to assess their personal nutrition priorities by looking at their family history, their own health history and current risk factors. Patients will also be able to discuss ways of prioritizing and  modifying the Pritikin Eating Plan for their highest risk areas  Menu  Clinical staff led group instruction and group discussion with PowerPoint presentation and patient guidebook. To enhance the learning environment the use of posters, models and videos may be added. Using menus brought in from [employer], or printed from [employer], patients will apply the Pritikin dining out guidelines that were presented in the [employer] video. Patients will also be able to practice these guidelines in a variety of provided scenarios. The purpose of this lesson is to provide patients with the opportunity to practice hands-on learning of the Pritikin Dining Out guidelines with actual menus and practice scenarios.  Label Reading Clinical staff led group instruction and group discussion with PowerPoint presentation and patient guidebook. To enhance the learning environment the use of posters, models and videos may be added. Patients will review and discuss the Pritikin label reading guidelines presented in Pritikin's Label Reading Educational series video. Using fool labels brought in from local grocery stores and markets, patients will apply the label reading guidelines and determine if the packaged food meet the Pritikin guidelines. The purpose of this lesson is to provide patients with the opportunity to review, discuss, and practice hands-on learning of the Pritikin Label Reading guidelines with actual packaged food labels. Cooking School  Pritikin's [employer] are designed to teach patients ways to prepare quick, simple, and affordable recipes at home. The importance of nutrition's role in chronic disease risk reduction is reflected in its emphasis in the overall Pritikin program. By learning how to prepare essential core Pritikin Eating Plan recipes, patients will increase control over what they eat; be able to customize the flavor of foods without the use of added salt,  sugar, or fat; and improve the quality of the food they consume. By learning a set of core recipes which are easily assembled, quickly prepared, and affordable, patients are more likely to prepare more healthy foods at home. These workshops focus on convenient breakfasts, simple entres, side dishes, and desserts which can be prepared with minimal effort and are consistent with nutrition recommendations for cardiovascular risk reduction. Cooking [employer] are taught by a Armed forces logistics/support/administrative officer (RD) who has been trained by the [employer]. The chef or RD has a clear understanding of the importance of minimizing - if not completely eliminating - added fat, sugar, and sodium in recipes. Throughout the series of Cooking School Workshop sessions, patients will learn about healthy ingredients and efficient methods of cooking to build confidence in their capability to prepare    Cooking School weekly topics:  Adding Flavor- Sodium-Free  Fast and Healthy Breakfasts  Powerhouse Plant-Based Proteins  Satisfying  Salads and Dressings  Simple Sides and Sauces  International Cuisine-Spotlight on the [employer] Zones  Delicious Desserts  Savory Soups  [employer] - Meals in a Snap  Tasty Appetizers and Snacks  Comforting Weekend Breakfasts  One-Pot Wonders   Fast Evening Meals  Landscape architect Your Pritikin Plate  WORKSHOPS   Healthy Mindset (Psychosocial):  Focused Goals, Sustainable Changes Clinical staff led group instruction and group discussion with PowerPoint presentation and patient guidebook. To enhance the learning environment the use of posters, models and videos may be added. Patients will be able to apply effective goal setting strategies to establish at least one personal goal, and then take consistent, meaningful action toward that goal. They will learn to identify common barriers to achieving personal goals and develop strategies to overcome  them. Patients will also gain an understanding of how our mind-set can impact our ability to achieve goals and the importance of cultivating a positive and growth-oriented mind-set. The purpose of this lesson is to provide patients with a deeper understanding of how to set and achieve personal goals, as well as the tools and strategies needed to overcome common obstacles which may arise along the way.  From Head to Heart: The Power of a Healthy Outlook  Clinical staff led group instruction and group discussion with PowerPoint presentation and patient guidebook. To enhance the learning environment the use of posters, models and videos may be added. Patients will be able to recognize and describe the impact of emotions and mood on physical health. They will discover the importance of self-care and explore self-care practices which may work for them. Patients will also learn how to utilize the 4 C's to cultivate a healthier outlook and better manage stress and challenges. The purpose of this lesson is to demonstrate to patients how a healthy outlook is an essential part of maintaining good health, especially as they continue their cardiac rehab journey.  Healthy Sleep for a Healthy Heart Clinical staff led group instruction and group discussion with PowerPoint presentation and patient guidebook. To enhance the learning environment the use of posters, models and videos may be added. At the conclusion of this workshop, patients will be able to demonstrate knowledge of the importance of sleep to overall health, well-being, and quality of life. They will understand the symptoms of, and treatments for, common sleep disorders. Patients will also be able to identify daytime and nighttime behaviors which impact sleep, and they will be able to apply these tools to help manage sleep-related challenges. The purpose of this lesson is to provide patients with a general overview of sleep and outline the importance of quality  sleep. Patients will learn about a few of the most common sleep disorders. Patients will also be introduced to the concept of "sleep hygiene," and discover ways to self-manage certain sleeping problems through simple daily behavior changes. Finally, the workshop will motivate patients by clarifying the links between quality sleep and their goals of heart-healthy living.   Recognizing and Reducing Stress Clinical staff led group instruction and group discussion with PowerPoint presentation and patient guidebook. To enhance the learning environment the use of posters, models and videos may be added. At the conclusion of this workshop, patients will be able to understand the types of stress reactions, differentiate between acute and chronic stress, and recognize the impact that chronic stress has on their health. They will also be able to apply different coping mechanisms, such as reframing negative self-talk. Patients will have the opportunity to  practice a variety of stress management techniques, such as deep abdominal breathing, progressive muscle relaxation, and/or guided imagery.  The purpose of this lesson is to educate patients on the role of stress in their lives and to provide healthy techniques for coping with it.  Learning Barriers/Preferences:  Learning Barriers/Preferences - 11/21/23 1425       Learning Barriers/Preferences   Learning Barriers Sight   readers   Learning Preferences Audio;Computer/Internet;Group Instruction;Individual Instruction;Skilled Demonstration;Pictoral;Verbal Instruction;Written Material             Education Topics:  Knowledge Questionnaire Score:  Knowledge Questionnaire Score - 11/21/23 1430       Knowledge Questionnaire Score   Pre Score 21/24             Core Components/Risk Factors/Patient Goals at Admission:  Personal Goals and Risk Factors at Admission - 11/21/23 1426       Core Components/Risk Factors/Patient Goals on Admission     Weight Management Yes;Weight Maintenance    Intervention Weight Management: Develop a combined nutrition and exercise program designed to reach desired caloric intake, while maintaining appropriate intake of nutrient and fiber, sodium and fats, and appropriate energy expenditure required for the weight goal.;Weight Management: Provide education and appropriate resources to help participant work on and attain dietary goals.    Expected Outcomes Short Term: Continue to assess and modify interventions until short term weight is achieved;Long Term: Adherence to nutrition and physical activity/exercise program aimed toward attainment of established weight goal;Weight Maintenance: Understanding of the daily nutrition guidelines, which includes 25-35% calories from fat, 7% or less cal from saturated fats, less than 200mg  cholesterol, less than 1.5gm of sodium, & 5 or more servings of fruits and vegetables daily;Understanding recommendations for meals to include 15-35% energy as protein, 25-35% energy from fat, 35-60% energy from carbohydrates, less than 200mg  of dietary cholesterol, 20-35 gm of total fiber daily;Understanding of distribution of calorie intake throughout the day with the consumption of 4-5 meals/snacks    Diabetes Yes    Intervention Provide education about proper nutrition, including hydration, and aerobic/resistive exercise prescription along with prescribed medications to achieve blood glucose in normal ranges: Fasting glucose 65-99 mg/dL;Provide education about signs/symptoms and action to take for hypo/hyperglycemia.    Expected Outcomes Short Term: Participant verbalizes understanding of the signs/symptoms and immediate care of hyper/hypoglycemia, proper foot care and importance of medication, aerobic/resistive exercise and nutrition plan for blood glucose control.;Long Term: Attainment of HbA1C < 7%.    Heart Failure --    Intervention --    Expected Outcomes --    Hypertension Yes     Intervention Provide education on lifestyle modifcations including regular physical activity/exercise, weight management, moderate sodium restriction and increased consumption of fresh fruit, vegetables, and low fat dairy, alcohol moderation, and smoking cessation.;Monitor prescription use compliance.    Expected Outcomes Short Term: Continued assessment and intervention until BP is < 140/38mm HG in hypertensive participants. < 130/68mm HG in hypertensive participants with diabetes, heart failure or chronic kidney disease.;Long Term: Maintenance of blood pressure at goal levels.    Lipids Yes    Intervention Provide education and support for participant on nutrition & aerobic/resistive exercise along with prescribed medications to achieve LDL 70mg , HDL >40mg .    Expected Outcomes Short Term: Participant states understanding of desired cholesterol values and is compliant with medications prescribed. Participant is following exercise prescription and nutrition guidelines.;Long Term: Cholesterol controlled with medications as prescribed, with individualized exercise RX and with personalized nutrition plan. Value goals:  LDL < 70mg , HDL > 40 mg.             Core Components/Risk Factors/Patient Goals Review:    Core Components/Risk Factors/Patient Goals at Discharge (Final Review):    ITP Comments:  ITP Comments     Row Name 11/21/23 1321           ITP Comments Dr. Armanda Magic medical director. Introduction to pritikin education/intensive cardiac rehab. Initial orientation packet reviewed with patient.                Comments: Participant attended orientation for the cardiac rehabilitation program on  11/21/2023  to perform initial intake and exercise walk test. Patient introduced to the Pritikin Program education and orientation packet was reviewed. Completed 6-minute walk test, measurements, initial ITP, and exercise prescription. Vital signs stable. Telemetry- Afib with PVCs,  asymptomatic.   Service time was from 1320 to [id number].  Jonna Coup, MS, ACSM-CEP 11/21/2023 3:56 PM

## 2023-11-22 ENCOUNTER — Telehealth (HOSPITAL_COMMUNITY): Payer: Self-pay

## 2023-11-22 NOTE — Telephone Encounter (Signed)
 Pt called and left VM on answering machine. Returned call, pt stated he wanted to do CRP2 3 days/week. Will add appts for Fridays.   Jonna Coup, MS, ACSM-CEP 11/22/2023 10:02 AM

## 2023-11-26 ENCOUNTER — Encounter (HOSPITAL_COMMUNITY)
Admission: RE | Admit: 2023-11-26 | Discharge: 2023-11-26 | Disposition: A | Discharge status: Home / Self Care | Source: Ambulatory Visit | Attending: Cardiology | Admitting: Cardiology

## 2023-11-26 DIAGNOSIS — Z952 Presence of prosthetic heart valve: Secondary | ICD-10-CM

## 2023-11-26 LAB — GLUCOSE, CAPILLARY
Glucose-Capillary: 215 mg/dL — ABNORMAL HIGH (ref 70–99)
Glucose-Capillary: 173 mg/dL — ABNORMAL HIGH (ref 70–99)

## 2023-11-26 NOTE — Progress Notes (Signed)
 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 1 hour prior to dental work including cleanings, Disp: 12 tablet, Rfl: 12   ascorbic acid (VITAMIN C) 500 MG tablet, Take 500 mg by mouth daily., Disp: , Rfl:    Cholecalciferol (VITAMIN D3) 1000 units CAPS, Take 1,000 Units by mouth daily., Disp: , Rfl:    diltiazem (CARDIZEM CD) 120 MG 24 hr capsule, Take 1 capsule (120 mg total) by mouth daily., Disp: 90 capsule, Rfl: 3   glucose blood (ONETOUCH VERIO) test strip, Use to check blood sugar 2 times a day as directed Dx E11.4, Disp: , Rfl:    Lancets (ONETOUCH ULTRASOFT) lancets, use for OneTouch Ultra meter to test up to bid, Disp: , Rfl:    losartan (COZAAR) 100 MG tablet, Take 100 mg by mouth daily.  , Disp: , Rfl:    metFORMIN (GLUCOPHAGE-XR) 500 MG 24 hr tablet, Take 500 mg by mouth 2 (two) times daily with a meal., Disp: , Rfl:    pantoprazole (PROTONIX) 40 MG tablet, Take 1 tablet (40 mg total) by mouth daily., Disp: 30 tablet, Rfl: 2   rosuvastatin (CRESTOR) 5 MG tablet, Take 5 mg by mouth daily.  , Disp: , Rfl:    warfarin (JANTOVEN) 5 MG tablet, Take 7.5-10 mg by mouth See admin instructions. Take with 10 mg Mon Wed Fri and Sat, and 7.5 mg Sun Tues, and Thurs, Disp: , Rfl:   Past Medical History: Past Medical History:  Diagnosis Date    Arthritis    Chronic atrial fibrillation (HCC)    History of radiation therapy    Hyperlipidemia    Hypertension    Prostate cancer (HCC)    Severe aortic stenosis     Tobacco Use: Social History   Tobacco Use  Smoking Status Never  Smokeless Tobacco Never    Labs: Review Flowsheet       Latest Ref Rng & Units 01/02/2011 09/19/2023 10/23/2023  Labs for ITP Cardiac and Pulmonary Rehab  Cholestrol 0 - 200 mg/dL 409  - -  LDL (calc) 0 - 99 mg/dL 63  - -  HDL-C >81.19 mg/dL 14.78  - -  Trlycerides 0.0 - 149.0 mg/dL 295.6  - -  PH, Arterial 7.35 - 7.45 - 7.384  -  PCO2 arterial 32 - 48 mmHg - 37.5  -  Bicarbonate 20.0 - 28.0 mmol/L - 22.4  23.4  24.5  -  TCO2 22 - 32 mmol/L - 24  25  26  24    Acid-base deficit 0.0 - 2.0 mmol/L - 2.0  2.0  1.0  -  O2 Saturation % - 99  71  70  -    Details       Multiple values from one day are sorted in reverse-chronological order         Capillary Blood Glucose:  Lab Results  Component Value Date   GLUCAP 173 (H) 11/26/2023   GLUCAP 215 (H) 11/26/2023   GLUCAP 165 (H) 10/24/2023   GLUCAP 297 (H) 10/24/2023   GLUCAP 150 (H) 10/24/2023     Exercise Target Goals: Exercise Program Goal: Individual exercise prescription set using results from initial 6 min walk test and THRR while considering  patient's activity barriers and safety.   Exercise Prescription Goal: Initial exercise prescription builds to 30-45 minutes a day of aerobic activity, 2-3 days per week.  Home exercise guidelines will be given to patient during program as part of exercise prescription that the participant will acknowledge.  Activity Barriers & Risk Stratification:  Activity Barriers & Cardiac Risk Stratification - 11/21/23 1423       Activity Barriers & Cardiac Risk Stratification   Activity Barriers Joint Problems;Deconditioning;Back Problems;Incisional Pain;Decreased Ventricular Function;Balance Concerns;Neck/Spine Problems;History of Falls    Cardiac  Risk Stratification High   <5 METs on            6 Minute Walk:  6 Minute Walk     Row Name 11/21/23 1551         6 Minute Walk   Phase Initial     Distance 1140 feet     Walk Time 6 minutes     # of Rest Breaks 1  3:00-3:30 Over THR     MPH 2.16     METS 2.17     RPE 13     Perceived Dyspnea  0     VO2 Peak 7.59     Symptoms Yes (comment)     Comments break from 3:00-3:30 due to over THR. 5/10 R leg chronic pain, resolved with rest     Resting HR 65 bpm     Resting BP 122/70     Resting Oxygen Saturation  97 %     Exercise Oxygen Saturation  during 6 min walk 97 %     Max Ex. HR 113 bpm     Max Ex. BP 132/66     2 Minute Post BP 110/64              Oxygen Initial Assessment:   Oxygen Re-Evaluation:   Oxygen Discharge (Final Oxygen Re-Evaluation):   Initial Exercise Prescription:  Initial Exercise Prescription - 11/21/23 1500       Date of Initial Exercise RX and Referring Provider   Date 11/21/23    Referring Provider Jodelle Red, MD    Expected Discharge Date 02/13/24      Recumbant Bike   Level 1    RPM 40    Watts 12    Minutes 15    METs 2      NuStep   Level 1    SPM 75    Minutes 15    METs 2      Prescription Details   Frequency (times per week) 3    Duration Progress to 30 minutes of continuous aerobic without signs/symptoms of physical distress      Intensity   THRR 40-80% of Max Heartrate 52-104    Ratings of Perceived Exertion 11-13    Perceived Dyspnea 0-4      Progression   Progression Continue progressive overload as per policy without signs/symptoms or physical distress.      Resistance Training   Training Prescription Yes    Weight 2    Reps 10-15  Perform Capillary Blood Glucose checks as needed.  Exercise Prescription Changes:   Exercise Prescription Changes     Row Name 11/26/23 1200             Response to Exercise   Blood Pressure (Admit) 132/64       Blood  Pressure (Exercise) 140/80       Blood Pressure (Exit) 102/60       Heart Rate (Admit) 64 bpm       Heart Rate (Exercise) 109 bpm       Heart Rate (Exit) 73 bpm       Rating of Perceived Exertion (Exercise) 9       Symptoms None       Comments Pt's first day in the CRP2 program       Duration Continue with 30 min of aerobic exercise without signs/symptoms of physical distress.       Intensity THRR unchanged         Progression   Progression Continue to progress workloads to maintain intensity without signs/symptoms of physical distress.       Average METs 2.15         Resistance Training   Training Prescription Yes       Weight 2 lbs       Reps 10-15       Time 10 Minutes         Interval Training   Interval Training No         Recumbant Bike   Level 1       RPM 57       Watts 14       Minutes 15       METs 2         NuStep   Level 1       SPM 84       Minutes 15       METs 2.3                Exercise Comments:   Exercise Comments     Row Name 11/26/23 1209           Exercise Comments Pt's first day in the CRP2 program. Pt exercised without complaints today.                Exercise Goals and Review:   Exercise Goals     Row Name 11/21/23 1322             Exercise Goals   Increase Physical Activity Yes       Intervention Develop an individualized exercise prescription for aerobic and resistive training based on initial evaluation findings, risk stratification, comorbidities and participant's personal goals.;Provide advice, education, support and counseling about physical activity/exercise needs.       Expected Outcomes Short Term: Attend rehab on a regular basis to increase amount of physical activity.;Long Term: Exercising regularly at least 3-5 days a week.;Long Term: Add in home exercise to make exercise part of routine and to increase amount of physical activity.       Increase Strength and Stamina Yes       Intervention Develop an  individualized exercise prescription for aerobic and resistive training based on initial evaluation findings, risk stratification, comorbidities and participant's personal goals.;Provide advice, education, support and counseling about physical activity/exercise needs.       Expected Outcomes Short Term: Increase workloads from initial exercise prescription for resistance, speed, and METs.;Short Term: Perform resistance training exercises  routinely during rehab and add in resistance training at home;Long Term: Improve cardiorespiratory fitness, muscular endurance and strength as measured by increased METs and functional capacity ( )       Able to understand and use rate of perceived exertion (RPE) scale Yes       Intervention Provide education and explanation on how to use RPE scale       Expected Outcomes Short Term: Able to use RPE daily in rehab to express subjective intensity level;Long Term:  Able to use RPE to guide intensity level when exercising independently       Knowledge and understanding of Target Heart Rate Range (THRR) Yes       Intervention Provide education and explanation of THRR including how the numbers were predicted and where they are located for reference       Expected Outcomes Short Term: Able to state/look up THRR;Short Term: Able to use daily as guideline for intensity in rehab;Long Term: Able to use THRR to govern intensity when exercising independently       Understanding of Exercise Prescription Yes       Intervention Provide education, explanation, and written materials on patient's individual exercise prescription       Expected Outcomes Short Term: Able to explain program exercise prescription;Long Term: Able to explain home exercise prescription to exercise independently                Exercise Goals Re-Evaluation :  Exercise Goals Re-Evaluation     Row Name 11/26/23 1208             Exercise Goal Re-Evaluation   Exercise Goals Review Increase Physical  Activity;Understanding of Exercise Prescription;Increase Strength and Stamina;Knowledge and understanding of Target Heart Rate Range (THRR);Able to understand and use rate of perceived exertion (RPE) scale       Comments Pt's first day in the CRP2 program. Pt understands the exercise Rx, RPE sclae and THRR.       Expected Outcomes Will continue to monitor patient and progress exercise workloads as tolerated.                Discharge Exercise Prescription (Final Exercise Prescription Changes):  Exercise Prescription Changes - 11/26/23 1200       Response to Exercise   Blood Pressure (Admit) 132/64    Blood Pressure (Exercise) 140/80    Blood Pressure (Exit) 102/60    Heart Rate (Admit) 64 bpm    Heart Rate (Exercise) 109 bpm    Heart Rate (Exit) 73 bpm    Rating of Perceived Exertion (Exercise) 9    Symptoms None    Comments Pt's first day in the CRP2 program    Duration Continue with 30 min of aerobic exercise without signs/symptoms of physical distress.    Intensity THRR unchanged      Progression   Progression Continue to progress workloads to maintain intensity without signs/symptoms of physical distress.    Average METs 2.15      Resistance Training   Training Prescription Yes    Weight 2 lbs    Reps 10-15    Time 10 Minutes      Interval Training   Interval Training No      Recumbant Bike   Level 1    RPM 57    Watts 14    Minutes 15    METs 2      NuStep   Level 1    SPM 84    Minutes 15  METs 2.3             Nutrition:  Target Goals: Understanding of nutrition guidelines, daily intake of sodium 1500mg , cholesterol 200mg , calories 30% from fat and 7% or less from saturated fats, daily to have 5 or more servings of fruits and vegetables.  Biometrics:    Nutrition Therapy Plan and Nutrition Goals:  Nutrition Therapy & Goals - 11/26/23 1034       Nutrition Therapy   Diet Heart Healthy Diet    Drug/Food Interactions Statins/Certain  Fruits;Coumadin/Vit K      Personal Nutrition Goals   Nutrition Goal Patient to identify strategies for reducing cardiovascular risk by attending the Pritikin education and nutrition series weekly.    Personal Goal #2 Patient to improve diet quality by using the plate method as a guide for meal planning to include lean protein/plant protein, fruits, vegetables, whole grains, nonfat dairy as part of a well-balanced diet.    Comments Traveon has medical history of HTN, afib, hyperlipidemia, s/pTAVR, DM2. A1c is well controlled in a pre-diabetic range. Patient will benefit from participation in intensive cardiac rehab for nutrition, exercise, and lifestyle modification.      Intervention Plan   Intervention Prescribe, educate and counsel regarding individualized specific dietary modifications aiming towards targeted core components such as weight, hypertension, lipid management, diabetes, heart failure and other comorbidities.;Nutrition handout(s) given to patient.    Expected Outcomes Short Term Goal: Understand basic principles of dietary content, such as calories, fat, sodium, cholesterol and nutrients.;Long Term Goal: Adherence to prescribed nutrition plan.             Nutrition Assessments:  MEDIFICTS Score Key: >=70 Need to make dietary changes  40-70 Heart Healthy Diet <= 40 Therapeutic Level Cholesterol Diet    Picture Your Plate Scores: <45 Unhealthy dietary pattern with much room for improvement. 41-50 Dietary pattern unlikely to meet recommendations for good health and room for improvement. 51-60 More healthful dietary pattern, with some room for improvement.  >60 Healthy dietary pattern, although there may be some specific behaviors that could be improved.    Nutrition Goals Re-Evaluation:  Nutrition Goals Re-Evaluation     Row Name 11/26/23 1034             Goals   Current Weight 177 lb 7.5 oz (80.5 kg)       Comment no recent lipid panel available for review. A1c  6.3       Expected Outcome Tonya has medical history of HTN, afib, hyperlipidemia, s/pTAVR, DM2. A1c is well controlled in a pre-diabetic range. Patient will benefit from participation in intensive cardiac rehab for nutrition, exercise, and lifestyle modification.                Nutrition Goals Re-Evaluation:  Nutrition Goals Re-Evaluation     Row Name 11/26/23 1034             Goals   Current Weight 177 lb 7.5 oz (80.5 kg)       Comment no recent lipid panel available for review. A1c 6.3       Expected Outcome Elwyn has medical history of HTN, afib, hyperlipidemia, s/pTAVR, DM2. A1c is well controlled in a pre-diabetic range. Patient will benefit from participation in intensive cardiac rehab for nutrition, exercise, and lifestyle modification.                Nutrition Goals Discharge (Final Nutrition Goals Re-Evaluation):  Nutrition Goals Re-Evaluation - 11/26/23 1034  Goals   Current Weight 177 lb 7.5 oz (80.5 kg)    Comment no recent lipid panel available for review. A1c 6.3    Expected Outcome Stryker has medical history of HTN, afib, hyperlipidemia, s/pTAVR, DM2. A1c is well controlled in a pre-diabetic range. Patient will benefit from participation in intensive cardiac rehab for nutrition, exercise, and lifestyle modification.             Psychosocial: Target Goals: Acknowledge presence or absence of significant depression and/or stress, maximize coping skills, provide positive support system. Participant is able to verbalize types and ability to use techniques and skills needed for reducing stress and depression.  Initial Review & Psychosocial Screening:  Initial Psych Review & Screening - 11/21/23 1423       Initial Review   Current issues with None Identified      Family Dynamics   Good Support System? Yes   Wife for support   Comments Dajohn is frustrated by his continued lack of energy since his TAVR. He'd hoped that the procedure would help with  his low energy, however he is still finding some daily tasks difficult. He denies any feelings of anxiety/depression/stress or need for additional support.      Barriers   Psychosocial barriers to participate in program There are no identifiable barriers or psychosocial needs.      Screening Interventions   Interventions Encouraged to exercise;Provide feedback about the scores to participant    Expected Outcomes Short Term goal: Identification and review with participant of any Quality of Life or Depression concerns found by scoring the questionnaire.;Long Term goal: The participant improves quality of Life and PHQ9 Scores as seen by post scores and/or verbalization of changes             Quality of Life Scores:  Quality of Life - 11/21/23 1448       Quality of Life   Select Quality of Life      Quality of Life Scores   Health/Function Pre 22.04 %    Socioeconomic Pre 27.14 %    Psych/Spiritual Pre 27.43 %    Family Pre 28.8 %    GLOBAL Pre 25.39 %            Scores of 19 and below usually indicate a poorer quality of life in these areas.  A difference of  2-3 points is a clinically meaningful difference.  A difference of 2-3 points in the total score of the Quality of Life Index has been associated with significant improvement in overall quality of life, self-image, physical symptoms, and general health in studies assessing change in quality of life.  PHQ-9: Review Flowsheet       11/21/2023  Depression screen PHQ 2/9  Decreased Interest 1  Down, Depressed, Hopeless 0  PHQ - 2 Score 1  Altered sleeping 1  Tired, decreased energy 3  Change in appetite 1  Feeling bad or failure about yourself  0  Trouble concentrating 0  Moving slowly or fidgety/restless 0  Suicidal thoughts 0  PHQ-9 Score 6  Difficult doing work/chores Not difficult at all   Interpretation of Total Score  Total Score Depression Severity:  1-4 = Minimal depression, 5-9 = Mild depression, 10-14 =  Moderate depression, 15-19 = Moderately severe depression, 20-27 = Severe depression   Psychosocial Evaluation and Intervention:   Psychosocial Re-Evaluation:  Psychosocial Re-Evaluation     Row Name 11/26/23 [phone]  Psychosocial Re-Evaluation   Current issues with Current Stress Concerns       Comments Dezi says he is not having any problems sleeping but does admit to having low energy since TAVR surgery. Borna says he may feel this way due to his age. Denies being depressed.       Expected Outcomes Kebin will have controlled or reduced stressors upon completion of cardiac rehab       Interventions Stress management education;Relaxation education;Encouraged to attend Cardiac Rehabilitation for the exercise       Continue Psychosocial Services  No Follow up required         Initial Review   Source of Stress Concerns Chronic Illness       Comments Will continue to monitor and offer support as needed                Psychosocial Discharge (Final Psychosocial Re-Evaluation):  Psychosocial Re-Evaluation - 11/26/23 0952       Psychosocial Re-Evaluation   Current issues with Current Stress Concerns    Comments Eberardo says he is not having any problems sleeping but does admit to having low energy since TAVR surgery. Dantae says he may feel this way due to his age. Denies being depressed.    Expected Outcomes Hiroki will have controlled or reduced stressors upon completion of cardiac rehab    Interventions Stress management education;Relaxation education;Encouraged to attend Cardiac Rehabilitation for the exercise    Continue Psychosocial Services  No Follow up required      Initial Review   Source of Stress Concerns Chronic Illness    Comments Will continue to monitor and offer support as needed             Vocational Rehabilitation: Provide vocational rehab assistance to qualifying candidates.   Vocational Rehab Evaluation & Intervention:  Vocational Rehab -  11/21/23 1426       Initial Vocational Rehab Evaluation & Intervention   Assessment shows need for Vocational Rehabilitation No   Abdullahi is retired            Education: Education Goals: Education classes will be provided on a weekly basis, covering required topics. Participant will state understanding/return demonstration of topics presented.    Education     Row Name 11/26/23 0900     Education   Cardiac Education Topics Pritikin   Select Core Videos     Core Videos   Educator Exercise Physiologist   Select Exercise Education   Exercise Education Biomechanial Limitations   Instruction Review Code 1- Verbalizes Understanding   Class Start Time [phone]   Class Stop Time 0841   Class Time Calculation (min) 33 min            Core Videos: Exercise    Move It!  Clinical staff conducted group or individual video education with verbal and written material and guidebook.  Patient learns the recommended Pritikin exercise program. Exercise with the goal of living a long, healthy life. Some of the health benefits of exercise include controlled diabetes, healthier blood pressure levels, improved cholesterol levels, improved heart and lung capacity, improved sleep, and better body composition. Everyone should speak with their doctor before starting or changing an exercise routine.  Biomechanical Limitations Clinical staff conducted group or individual video education with verbal and written material and guidebook.  Patient learns how biomechanical limitations can impact exercise and how we can mitigate and possibly overcome limitations to have an impactful and balanced exercise routine.  Body Composition  Clinical staff conducted group or individual video education with verbal and written material and guidebook.  Patient learns that body composition (ratio of muscle mass to fat mass) is a key component to assessing overall fitness, rather than body weight alone. Increased fat mass,  especially visceral belly fat, can put [place] at increased risk for metabolic syndrome, type 2 diabetes, heart disease, and even death. It is recommended to combine diet and exercise (cardiovascular and resistance training) to improve your body composition. Seek guidance from your physician and exercise physiologist before implementing an exercise routine.  Exercise Action Plan Clinical staff conducted group or individual video education with verbal and written material and guidebook.  Patient learns the recommended strategies to achieve and enjoy long-term exercise adherence, including variety, self-motivation, self-efficacy, and positive decision making. Benefits of exercise include fitness, good health, weight management, more energy, better sleep, less stress, and overall well-being.  Medical   Heart Disease Risk Reduction Clinical staff conducted group or individual video education with verbal and written material and guidebook.  Patient learns our heart is our most vital organ as it circulates oxygen, nutrients, white blood cells, and hormones throughout the entire body, and carries waste away. Data supports a plant-based eating plan like the Pritikin Program for its effectiveness in slowing progression of and reversing heart disease. The video provides a number of recommendations to address heart disease.   Metabolic Syndrome and Belly Fat  Clinical staff conducted group or individual video education with verbal and written material and guidebook.  Patient learns what metabolic syndrome is, how it leads to heart disease, and how one can reverse it and keep it from coming back. You have metabolic syndrome if you have 3 of the following 5 criteria: abdominal obesity, high blood pressure, high triglycerides, low HDL cholesterol, and high blood sugar.  Hypertension and Heart Disease Clinical staff conducted group or individual video education with verbal and written material and guidebook.  Patient  learns that high blood pressure, or hypertension, is very common in the [place]. Hypertension is largely due to excessive salt intake, but other important risk factors include being overweight, physical inactivity, drinking too much alcohol, smoking, and not eating enough potassium from fruits and vegetables. High blood pressure is a leading risk factor for heart attack, stroke, congestive heart failure, dementia, kidney failure, and premature death. Long-term effects of excessive salt intake include stiffening of the arteries and thickening of heart muscle and organ damage. Recommendations include ways to reduce hypertension and the risk of heart disease.  Diseases of Our Time - Focusing on Diabetes Clinical staff conducted group or individual video education with verbal and written material and guidebook.  Patient learns why the best way to stop diseases of our time is prevention, through food and other lifestyle changes. Medicine (such as prescription pills and surgeries) is often only a Band-Aid on the problem, not a long-term solution. Most common diseases of our time include obesity, type 2 diabetes, hypertension, heart disease, and cancer. The Pritikin Program is recommended and has been proven to help reduce, reverse, and/or prevent the damaging effects of metabolic syndrome.  Nutrition   Overview of the Pritikin Eating Plan  Clinical staff conducted group or individual video education with verbal and written material and guidebook.  Patient learns about the Pritikin Eating Plan for disease risk reduction. The Pritikin Eating Plan emphasizes a wide variety of unrefined, minimally-processed carbohydrates, like fruits, vegetables, whole grains, and legumes. Go, Caution, and Stop food choices are explained. Plant-based  and lean animal proteins are emphasized. Rationale provided for low sodium intake for blood pressure control, low added sugars for blood sugar stabilization, and low added fats and  oils for coronary artery disease risk reduction and weight management.  Calorie Density  Clinical staff conducted group or individual video education with verbal and written material and guidebook.  Patient learns about calorie density and how it impacts the Pritikin Eating Plan. Knowing the characteristics of the food you choose will help you decide whether those foods will lead to weight gain or weight loss, and whether you want to consume more or less of them. Weight loss is usually a side effect of the Pritikin Eating Plan because of its focus on low calorie-dense foods.  Label Reading  Clinical staff conducted group or individual video education with verbal and written material and guidebook.  Patient learns about the Pritikin recommended label reading guidelines and corresponding recommendations regarding calorie density, added sugars, sodium content, and whole grains.  Dining Out - Part 1  Clinical staff conducted group or individual video education with verbal and written material and guidebook.  Patient learns that restaurant meals can be sabotaging because they can be so high in calories, fat, sodium, and/or sugar. Patient learns recommended strategies on how to positively address this and avoid unhealthy pitfalls.  Facts on Fats  Clinical staff conducted group or individual video education with verbal and written material and guidebook.  Patient learns that lifestyle modifications can be just as effective, if not more so, as many medications for lowering your risk of heart disease. A Pritikin lifestyle can help to reduce your risk of inflammation and atherosclerosis (cholesterol build-up, or plaque, in the artery walls). Lifestyle interventions such as dietary choices and physical activity address the cause of atherosclerosis. A review of the types of fats and their impact on blood cholesterol levels, along with dietary recommendations to reduce fat intake is also included.  Nutrition  Action Plan  Clinical staff conducted group or individual video education with verbal and written material and guidebook.  Patient learns how to incorporate Pritikin recommendations into their lifestyle. Recommendations include planning and keeping personal health goals in mind as an important part of their success.  Healthy Mind-Set    Healthy Minds, Bodies, Hearts  Clinical staff conducted group or individual video education with verbal and written material and guidebook.  Patient learns how to identify when they are stressed. Video will discuss the impact of that stress, as well as the many benefits of stress management. Patient will also be introduced to stress management techniques. The way we think, act, and feel has an impact on our hearts.  How Our Thoughts Can Heal Our Hearts  Clinical staff conducted group or individual video education with verbal and written material and guidebook.  Patient learns that negative thoughts can cause depression and anxiety. This can result in negative lifestyle behavior and serious health problems. Cognitive behavioral therapy is an effective method to help control our thoughts in order to change and improve our emotional outlook.  Additional Videos:  Exercise    Improving Performance  Clinical staff conducted group or individual video education with verbal and written material and guidebook.  Patient learns to use a non-linear approach by alternating intensity levels and lengths of time spent exercising to help burn more calories and lose more body fat. Cardiovascular exercise helps improve heart health, metabolism, hormonal balance, blood sugar control, and recovery from fatigue. Resistance training improves strength, endurance, balance, coordination, reaction time,  metabolism, and muscle mass. Flexibility exercise improves circulation, posture, and balance. Seek guidance from your physician and exercise physiologist before implementing an exercise routine  and learn your capabilities and proper form for all exercise.  Introduction to Yoga  Clinical staff conducted group or individual video education with verbal and written material and guidebook.  Patient learns about yoga, a discipline of the coming together of mind, breath, and body. The benefits of yoga include improved flexibility, improved range of motion, better posture and core strength, increased lung function, weight loss, and positive self-image. Yoga's heart health benefits include lowered blood pressure, healthier heart rate, decreased cholesterol and triglyceride levels, improved immune function, and reduced stress. Seek guidance from your physician and exercise physiologist before implementing an exercise routine and learn your capabilities and proper form for all exercise.  Medical   Aging: Enhancing Your Quality of Life  Clinical staff conducted group or individual video education with verbal and written material and guidebook.  Patient learns key strategies and recommendations to stay in good physical health and enhance quality of life, such as prevention strategies, having an advocate, securing a Health Care Proxy and Power of Attorney, and keeping a list of medications and system for tracking them. It also discusses how to avoid risk for bone loss.  Biology of Weight Control  Clinical staff conducted group or individual video education with verbal and written material and guidebook.  Patient learns that weight gain occurs because we consume more calories than we burn (eating more, moving less). Even if your body weight is normal, you may have higher ratios of fat compared to muscle mass. Too much body fat puts you at increased risk for cardiovascular disease, heart attack, stroke, type 2 diabetes, and obesity-related cancers. In addition to exercise, following the Pritikin Eating Plan can help reduce your risk.  Decoding Lab Results  Clinical staff conducted group or individual video  education with verbal and written material and guidebook.  Patient learns that lab test reflects one measurement whose values change over time and are influenced by many factors, including medication, stress, sleep, exercise, food, hydration, pre-existing medical conditions, and more. It is recommended to use the knowledge from this video to become more involved with your lab results and evaluate your numbers to speak with your doctor.   Diseases of Our Time - Overview  Clinical staff conducted group or individual video education with verbal and written material and guidebook.  Patient learns that according to the CDC, 50% to 70% of chronic diseases (such as obesity, type 2 diabetes, elevated lipids, hypertension, and heart disease) are avoidable through lifestyle improvements including healthier food choices, listening to satiety cues, and increased physical activity.  Sleep Disorders Clinical staff conducted group or individual video education with verbal and written material and guidebook.  Patient learns how good quality and duration of sleep are important to overall health and well-being. Patient also learns about sleep disorders and how they impact health along with recommendations to address them, including discussing with a physician.  Nutrition  Dining Out - Part 2 Clinical staff conducted group or individual video education with verbal and written material and guidebook.  Patient learns how to plan ahead and communicate in order to maximize their dining experience in a healthy and nutritious manner. Included are recommended food choices based on the type of restaurant the patient is visiting.   Fueling a Banker conducted group or individual video education with verbal and written material and guidebook.  There is a strong connection between our food choices and our health. Diseases like obesity and type 2 diabetes are very prevalent and are in large-part due to  lifestyle choices. The Pritikin Eating Plan provides plenty of food and hunger-curbing satisfaction. It is easy to follow, affordable, and helps reduce health risks.  Menu Workshop  Clinical staff conducted group or individual video education with verbal and written material and guidebook.  Patient learns that restaurant meals can sabotage health goals because they are often packed with calories, fat, sodium, and sugar. Recommendations include strategies to plan ahead and to communicate with the manager, chef, or server to help order a healthier meal.  Planning Your Eating Strategy  Clinical staff conducted group or individual video education with verbal and written material and guidebook.  Patient learns about the Pritikin Eating Plan and its benefit of reducing the risk of disease. The Pritikin Eating Plan does not focus on calories. Instead, it emphasizes high-quality, nutrient-rich foods. By knowing the characteristics of the foods, we choose, we can determine their calorie density and make informed decisions.  Targeting Your Nutrition Priorities  Clinical staff conducted group or individual video education with verbal and written material and guidebook.  Patient learns that lifestyle habits have a tremendous impact on disease risk and progression. This video provides eating and physical activity recommendations based on your personal health goals, such as reducing LDL cholesterol, losing weight, preventing or controlling type 2 diabetes, and reducing high blood pressure.  Vitamins and Minerals  Clinical staff conducted group or individual video education with verbal and written material and guidebook.  Patient learns different ways to obtain key vitamins and minerals, including through a recommended healthy diet. It is important to discuss all supplements you take with your doctor.   Healthy Mind-Set    Smoking Cessation  Clinical staff conducted group or individual video education with  verbal and written material and guidebook.  Patient learns that cigarette smoking and tobacco addiction pose a serious health risk which affects millions of people. Stopping smoking will significantly reduce the risk of heart disease, lung disease, and many forms of cancer. Recommended strategies for quitting are covered, including working with your doctor to develop a successful plan.  Culinary   Becoming a Set designer conducted group or individual video education with verbal and written material and guidebook.  Patient learns that cooking at home can be healthy, cost-effective, quick, and puts them in control. Keys to cooking healthy recipes will include looking at your recipe, assessing your equipment needs, planning ahead, making it simple, choosing cost-effective seasonal ingredients, and limiting the use of added fats, salts, and sugars.  Cooking - Breakfast and Snacks  Clinical staff conducted group or individual video education with verbal and written material and guidebook.  Patient learns how important breakfast is to satiety and nutrition through the entire day. Recommendations include key foods to eat during breakfast to help stabilize blood sugar levels and to prevent overeating at meals later in the day. Planning ahead is also a key component.  Cooking - Educational psychologist conducted group or individual video education with verbal and written material and guidebook.  Patient learns eating strategies to improve overall health, including an approach to cook more at home. Recommendations include thinking of animal protein as a side on your plate rather than center stage and focusing instead on lower calorie dense options like vegetables, fruits, whole grains, and plant-based proteins, such as beans. Making sauces  in large quantities to freeze for later and leaving the skin on your vegetables are also recommended to maximize your experience.  Cooking - Healthy  Salads and Dressing Clinical staff conducted group or individual video education with verbal and written material and guidebook.  Patient learns that vegetables, fruits, whole grains, and legumes are the foundations of the Pritikin Eating Plan. Recommendations include how to incorporate each of these in flavorful and healthy salads, and how to create homemade salad dressings. Proper handling of ingredients is also covered. Cooking - Soups and [employer] - Soups and Desserts Clinical staff conducted group or individual video education with verbal and written material and guidebook.  Patient learns that Pritikin soups and desserts make for easy, nutritious, and delicious snacks and meal components that are low in sodium, fat, sugar, and calorie density, while high in vitamins, minerals, and filling fiber. Recommendations include simple and healthy ideas for soups and desserts.   Overview     The Pritikin Solution Program Overview Clinical staff conducted group or individual video education with verbal and written material and guidebook.  Patient learns that the results of the Pritikin Program have been documented in more than 100 articles published in peer-reviewed journals, and the benefits include reducing risk factors for (and, in some cases, even reversing) high cholesterol, high blood pressure, type 2 diabetes, obesity, and more! An overview of the three key pillars of the Pritikin Program will be covered: eating well, doing regular exercise, and having a healthy mind-set.  WORKSHOPS  Exercise: Exercise Basics: Building Your Action Plan Clinical staff led group instruction and group discussion with PowerPoint presentation and patient guidebook. To enhance the learning environment the use of posters, models and videos may be added. At the conclusion of this workshop, patients will comprehend the difference between physical activity and exercise, as well as the benefits of  incorporating both, into their routine. Patients will understand the FITT (Frequency, Intensity, Time, and Type) principle and how to use it to build an exercise action plan. In addition, safety concerns and other considerations for exercise and cardiac rehab will be addressed by the presenter. The purpose of this lesson is to promote a comprehensive and effective weekly exercise routine in order to improve patients' overall level of fitness.   Managing Heart Disease: Your Path to a Healthier Heart Clinical staff led group instruction and group discussion with PowerPoint presentation and patient guidebook. To enhance the learning environment the use of posters, models and videos may be added.At the conclusion of this workshop, patients will understand the anatomy and physiology of the heart. Additionally, they will understand how Pritikin's three pillars impact the risk factors, the progression, and the management of heart disease.  The purpose of this lesson is to provide a high-level overview of the heart, heart disease, and how the Pritikin lifestyle positively impacts risk factors.  Exercise Biomechanics Clinical staff led group instruction and group discussion with PowerPoint presentation and patient guidebook. To enhance the learning environment the use of posters, models and videos may be added. Patients will learn how the structural parts of their bodies function and how these functions impact their daily activities, movement, and exercise. Patients will learn how to promote a neutral spine, learn how to manage pain, and identify ways to improve their physical movement in order to promote healthy living. The purpose of this lesson is to expose patients to common physical limitations that impact physical activity. Participants will learn practical ways to adapt and manage aches  and pains, and to minimize their effect on regular exercise. Patients will learn how to maintain good posture  while sitting, walking, and lifting.  Balance Training and Fall Prevention  Clinical staff led group instruction and group discussion with PowerPoint presentation and patient guidebook. To enhance the learning environment the use of posters, models and videos may be added. At the conclusion of this workshop, patients will understand the importance of their sensorimotor skills (vision, proprioception, and the vestibular system) in maintaining their ability to balance as they age. Patients will apply a variety of balancing exercises that are appropriate for their current level of function. Patients will understand the common causes for poor balance, possible solutions to these problems, and ways to modify their physical environment in order to minimize their fall risk. The purpose of this lesson is to teach patients about the importance of maintaining balance as they age and ways to minimize their risk of falling.  WORKSHOPS   Nutrition:  Fueling a Ship broker led group instruction and group discussion with PowerPoint presentation and patient guidebook. To enhance the learning environment the use of posters, models and videos may be added. Patients will review the foundational principles of the Pritikin Eating Plan and understand what constitutes a serving size in each of the food groups. Patients will also learn Pritikin-friendly foods that are better choices when away from home and review make-ahead meal and snack options. Calorie density will be reviewed and applied to three nutrition priorities: weight maintenance, weight loss, and weight gain. The purpose of this lesson is to reinforce (in a group setting) the key concepts around what patients are recommended to eat and how to apply these guidelines when away from home by planning and selecting Pritikin-friendly options. Patients will understand how calorie density may be adjusted for different weight management goals.  Mindful  Eating  Clinical staff led group instruction and group discussion with PowerPoint presentation and patient guidebook. To enhance the learning environment the use of posters, models and videos may be added. Patients will briefly review the concepts of the Pritikin Eating Plan and the importance of low-calorie dense foods. The concept of mindful eating will be introduced as well as the importance of paying attention to internal hunger signals. Triggers for non-hunger eating and techniques for dealing with triggers will be explored. The purpose of this lesson is to provide patients with the opportunity to review the basic principles of the Pritikin Eating Plan, discuss the value of eating mindfully and how to measure internal cues of hunger and fullness using the Hunger Scale. Patients will also discuss reasons for non-hunger eating and learn strategies to use for controlling emotional eating.  Targeting Your Nutrition Priorities Clinical staff led group instruction and group discussion with PowerPoint presentation and patient guidebook. To enhance the learning environment the use of posters, models and videos may be added. Patients will learn how to determine their genetic susceptibility to disease by reviewing their family history. Patients will gain insight into the importance of diet as part of an overall healthy lifestyle in mitigating the impact of genetics and other environmental insults. The purpose of this lesson is to provide patients with the opportunity to assess their personal nutrition priorities by looking at their family history, their own health history and current risk factors. Patients will also be able to discuss ways of prioritizing and modifying the Pritikin Eating Plan for their highest risk areas  Menu  Clinical staff led group instruction and group discussion with  PowerPoint presentation and patient guidebook. To enhance the learning environment the use of posters, models and videos may  be added. Using menus brought in from [employer], or printed from [employer], patients will apply the Pritikin dining out guidelines that were presented in the [employer] video. Patients will also be able to practice these guidelines in a variety of provided scenarios. The purpose of this lesson is to provide patients with the opportunity to practice hands-on learning of the Pritikin Dining Out guidelines with actual menus and practice scenarios.  Label Reading Clinical staff led group instruction and group discussion with PowerPoint presentation and patient guidebook. To enhance the learning environment the use of posters, models and videos may be added. Patients will review and discuss the Pritikin label reading guidelines presented in Pritikin's Label Reading Educational series video. Using fool labels brought in from local grocery stores and markets, patients will apply the label reading guidelines and determine if the packaged food meet the Pritikin guidelines. The purpose of this lesson is to provide patients with the opportunity to review, discuss, and practice hands-on learning of the Pritikin Label Reading guidelines with actual packaged food labels. Cooking School  Pritikin's [employer] are designed to teach patients ways to prepare quick, simple, and affordable recipes at home. The importance of nutrition's role in chronic disease risk reduction is reflected in its emphasis in the overall Pritikin program. By learning how to prepare essential core Pritikin Eating Plan recipes, patients will increase control over what they eat; be able to customize the flavor of foods without the use of added salt, sugar, or fat; and improve the quality of the food they consume. By learning a set of core recipes which are easily assembled, quickly prepared, and affordable, patients are more likely to prepare more healthy foods at home. These workshops focus on convenient  breakfasts, simple entres, side dishes, and desserts which can be prepared with minimal effort and are consistent with nutrition recommendations for cardiovascular risk reduction. Cooking [employer] are taught by a Armed forces logistics/support/administrative officer (RD) who has been trained by the [employer]. The chef or RD has a clear understanding of the importance of minimizing - if not completely eliminating - added fat, sugar, and sodium in recipes. Throughout the series of Cooking School Workshop sessions, patients will learn about healthy ingredients and efficient methods of cooking to build confidence in their capability to prepare    Cooking School weekly topics:  Adding Flavor- Sodium-Free  Fast and Healthy Breakfasts  Powerhouse Plant-Based Proteins  Satisfying Salads and Dressings  Simple Sides and Sauces  International Cuisine-Spotlight on the [employer] Zones  Delicious Desserts  Savory Soups  [employer] - Meals in a Astronomer Appetizers and Snacks  Comforting Weekend Breakfasts  One-Pot Wonders   Fast Evening Meals  Landscape architect Your Pritikin Plate  WORKSHOPS   Healthy Mindset (Psychosocial):  Focused Goals, Sustainable Changes Clinical staff led group instruction and group discussion with PowerPoint presentation and patient guidebook. To enhance the learning environment the use of posters, models and videos may be added. Patients will be able to apply effective goal setting strategies to establish at least one personal goal, and then take consistent, meaningful action toward that goal. They will learn to identify common barriers to achieving personal goals and develop strategies to overcome them. Patients will also gain an understanding of how our mind-set can impact our ability to achieve goals and the importance  of cultivating a positive and growth-oriented mind-set. The purpose of this lesson is to provide patients with a deeper understanding of  how to set and achieve personal goals, as well as the tools and strategies needed to overcome common obstacles which may arise along the way.  From Head to Heart: The Power of a Healthy Outlook  Clinical staff led group instruction and group discussion with PowerPoint presentation and patient guidebook. To enhance the learning environment the use of posters, models and videos may be added. Patients will be able to recognize and describe the impact of emotions and mood on physical health. They will discover the importance of self-care and explore self-care practices which may work for them. Patients will also learn how to utilize the 4 C's to cultivate a healthier outlook and better manage stress and challenges. The purpose of this lesson is to demonstrate to patients how a healthy outlook is an essential part of maintaining good health, especially as they continue their cardiac rehab journey.  Healthy Sleep for a Healthy Heart Clinical staff led group instruction and group discussion with PowerPoint presentation and patient guidebook. To enhance the learning environment the use of posters, models and videos may be added. At the conclusion of this workshop, patients will be able to demonstrate knowledge of the importance of sleep to overall health, well-being, and quality of life. They will understand the symptoms of, and treatments for, common sleep disorders. Patients will also be able to identify daytime and nighttime behaviors which impact sleep, and they will be able to apply these tools to help manage sleep-related challenges. The purpose of this lesson is to provide patients with a general overview of sleep and outline the importance of quality sleep. Patients will learn about a few of the most common sleep disorders. Patients will also be introduced to the concept of "sleep hygiene," and discover ways to self-manage certain sleeping problems through simple daily behavior changes. Finally, the workshop  will motivate patients by clarifying the links between quality sleep and their goals of heart-healthy living.   Recognizing and Reducing Stress Clinical staff led group instruction and group discussion with PowerPoint presentation and patient guidebook. To enhance the learning environment the use of posters, models and videos may be added. At the conclusion of this workshop, patients will be able to understand the types of stress reactions, differentiate between acute and chronic stress, and recognize the impact that chronic stress has on their health. They will also be able to apply different coping mechanisms, such as reframing negative self-talk. Patients will have the opportunity to practice a variety of stress management techniques, such as deep abdominal breathing, progressive muscle relaxation, and/or guided imagery.  The purpose of this lesson is to educate patients on the role of stress in their lives and to provide healthy techniques for coping with it.  Learning Barriers/Preferences:  Learning Barriers/Preferences - 11/21/23 1425       Learning Barriers/Preferences   Learning Barriers Sight   readers   Learning Preferences Audio;Computer/Internet;Group Instruction;Individual Instruction;Skilled Demonstration;Pictoral;Verbal Instruction;Written Material             Education Topics:  Knowledge Questionnaire Score:  Knowledge Questionnaire Score - 11/21/23 1430       Knowledge Questionnaire Score   Pre Score 21/24             Core Components/Risk Factors/Patient Goals at Admission:  Personal Goals and Risk Factors at Admission - 11/21/23 1426       Core Components/Risk Factors/Patient Goals  on Admission    Weight Management Yes;Weight Maintenance    Intervention Weight Management: Develop a combined nutrition and exercise program designed to reach desired caloric intake, while maintaining appropriate intake of nutrient and fiber, sodium and fats, and appropriate  energy expenditure required for the weight goal.;Weight Management: Provide education and appropriate resources to help participant work on and attain dietary goals.    Expected Outcomes Short Term: Continue to assess and modify interventions until short term weight is achieved;Long Term: Adherence to nutrition and physical activity/exercise program aimed toward attainment of established weight goal;Weight Maintenance: Understanding of the daily nutrition guidelines, which includes 25-35% calories from fat, 7% or less cal from saturated fats, less than 200mg  cholesterol, less than 1.5gm of sodium, & 5 or more servings of fruits and vegetables daily;Understanding recommendations for meals to include 15-35% energy as protein, 25-35% energy from fat, 35-60% energy from carbohydrates, less than 200mg  of dietary cholesterol, 20-35 gm of total fiber daily;Understanding of distribution of calorie intake throughout the day with the consumption of 4-5 meals/snacks    Diabetes Yes    Intervention Provide education about proper nutrition, including hydration, and aerobic/resistive exercise prescription along with prescribed medications to achieve blood glucose in normal ranges: Fasting glucose 65-99 mg/dL;Provide education about signs/symptoms and action to take for hypo/hyperglycemia.    Expected Outcomes Short Term: Participant verbalizes understanding of the signs/symptoms and immediate care of hyper/hypoglycemia, proper foot care and importance of medication, aerobic/resistive exercise and nutrition plan for blood glucose control.;Long Term: Attainment of HbA1C < 7%.    Heart Failure --    Intervention --    Expected Outcomes --    Hypertension Yes    Intervention Provide education on lifestyle modifcations including regular physical activity/exercise, weight management, moderate sodium restriction and increased consumption of fresh fruit, vegetables, and low fat dairy, alcohol moderation, and smoking  cessation.;Monitor prescription use compliance.    Expected Outcomes Short Term: Continued assessment and intervention until BP is < 140/50mm HG in hypertensive participants. < 130/71mm HG in hypertensive participants with diabetes, heart failure or chronic kidney disease.;Long Term: Maintenance of blood pressure at goal levels.    Lipids Yes    Intervention Provide education and support for participant on nutrition & aerobic/resistive exercise along with prescribed medications to achieve LDL 70mg , HDL >40mg .    Expected Outcomes Short Term: Participant states understanding of desired cholesterol values and is compliant with medications prescribed. Participant is following exercise prescription and nutrition guidelines.;Long Term: Cholesterol controlled with medications as prescribed, with individualized exercise RX and with personalized nutrition plan. Value goals: LDL < 70mg , HDL > 40 mg.             Core Components/Risk Factors/Patient Goals Review:    Core Components/Risk Factors/Patient Goals at Discharge (Final Review):    ITP Comments:  ITP Comments     Row Name 11/21/23 1321 11/26/23 0928         ITP Comments Dr. Armanda Magic medical director. Introduction to pritikin education/intensive cardiac rehab. Initial orientation packet reviewed with patient. 30 Day ITP Review. Tayson started cardiac rehab on 11/26/23. Dalton is off to a good start to exercise.               Comments: Quoc started cardiac rehab today.  Pt tolerated light exercise without difficulty. VSS, telemetry-chronic afib, asymptomatic.  Medication list reconciled. Pt denies barriers to medicaiton compliance.  PSYCHOSOCIAL ASSESSMENT:  PHQ-6. Daily says he is not having any problems sleeping but does admit to  having low energy since TAVR surgery. Shuan says he may feel this way due to his age. Denies being depressed.   Pt enjoys golf, gambling, yard work and spending time with his daughter.   Pt oriented to  exercise equipment and routine.    Understanding verbalized. Thayer Headings RN BSN

## 2023-11-27 ENCOUNTER — Ambulatory Visit
Admission: RE | Admit: 2023-11-27 | Discharge: 2023-11-27 | Disposition: A | Discharge status: Home / Self Care | Source: Ambulatory Visit | Attending: Physician Assistant | Admitting: Physician Assistant

## 2023-11-27 ENCOUNTER — Other Ambulatory Visit (HOSPITAL_COMMUNITY)
Admission: RE | Admit: 2023-11-27 | Discharge: 2023-11-27 | Disposition: A | Discharge status: Home / Self Care | Source: Ambulatory Visit | Attending: Radiology | Admitting: Radiology

## 2023-11-27 DIAGNOSIS — E041 Nontoxic single thyroid nodule: Secondary | ICD-10-CM

## 2023-11-28 ENCOUNTER — Encounter (HOSPITAL_COMMUNITY)
Admission: RE | Admit: 2023-11-28 | Discharge: 2023-11-28 | Disposition: A | Discharge status: Home / Self Care | Source: Ambulatory Visit | Attending: Cardiology | Admitting: Cardiology

## 2023-11-28 DIAGNOSIS — Z952 Presence of prosthetic heart valve: Secondary | ICD-10-CM | POA: Insufficient documentation

## 2023-11-28 LAB — GLUCOSE, CAPILLARY
Glucose-Capillary: 228 mg/dL — ABNORMAL HIGH (ref 70–99)
Glucose-Capillary: 181 mg/dL — ABNORMAL HIGH (ref 70–99)

## 2023-11-28 NOTE — Progress Notes (Unsigned)
 [facility]   MULTIDISCIPLINARY HEART VALVE CLINIC                                     Cardiology Office Note:    Date:  11/30/2023   ID:  Henry Holland, DOB April 26, 1933, MRN [id number]  PCP:  Cleatis Polka., MD  [facility] Cardiologist:  Jodelle Red, MD /  Dr. Clifton James & Dr. Leafy Ro (TAVR)  [facility] Electrophysiologist:  None   Referring MD: Cleatis Polka., MD   CC: 1 month s/p TAVR   History of Present Illness:    Henry Holland is a 89 y.o. male with a hx of persistent atrial fibrillation on warfarin (pt preference), HTN, HLD, prostate cancer in 2012 s/p XRT and pLFLG severe aortic stenosis s/p TAVR (10/23/23) who presents to clinic for follow up    He had prostate cancer in 2012 treated with radiation therapy. He has been followed for moderate aortic stenosis. Echo 07/17/23 showed EF 60%, mild RVE, severe pLFLG AS with mean grad 26 mmHg, peak grad 46.8 mmHg, AVA 0.76 cm2, DVI 0.24, SVI 24, moderate MAC with mild-mod MR. [facility] 09/19/23 showed mild non-obstructive CAD and mild elevation of PA pressure. Pre TAVR CTs showed a LAA thrombus and he was bridged with Lovenox for TAVR. S/p TAVR with a 26 mm Edwards Sapien 3 Ultra Resilia THV via the TF approach on 10/24/23. Post operative echo showed EF 55%, mod concentric LVH, mod RVE, mod pulm HTN, normally functioning TAVR with a mean gradient of 9 mmHg and no PVL as well as severe MAC, mild MS and severe MR. LVEDP noted to be elevated ~23 and treated with one dose of IV Lasix 20mg . His magnesium was also repleted. Discharged on Lovenox bridge.   He was seen for post hospital follow up on 11/02/23. He reported low energy, groin pain, and GERD. He had a lump on his groin and follow up vascular US was c/w hematoma. He was given a Rx for pantoprazole to try for GERD.  Today the patient presents to clinic for follow up. Here with his wife, Thayer Ohm. No CP or SOB. No LE edema, orthopnea or PND. Occasionally has bendopnea.  No dizziness or syncope. No blood in stool or urine. No palpitations. Lack of energy and fatigue are his biggest complaints. Participating in cardiac rehab.   Past Medical History:  Diagnosis Date   Arthritis    Chronic atrial fibrillation (HCC)    History of radiation therapy    Hyperlipidemia    Hypertension    Prostate cancer (HCC)    Severe aortic stenosis      Current Medications: Current Meds  Medication Sig   amoxicillin (AMOXIL) 500 MG tablet Take 4 tablets (2,000 mg total) by mouth as directed. 1 hour prior to dental work including cleanings   ascorbic acid (VITAMIN C) 500 MG tablet Take 500 mg by mouth daily.   Cholecalciferol (VITAMIN D3) 1000 units CAPS Take 1,000 Units by mouth daily.   diltiazem (CARDIZEM CD) 120 MG 24 hr capsule Take 1 capsule (120 mg total) by mouth daily.   glucose blood (ONETOUCH VERIO) test strip Use to check blood sugar 2 times a day as directed Dx E11.4   Lancets (ONETOUCH ULTRASOFT) lancets use for OneTouch Ultra meter to test up to bid   losartan (COZAAR) 100 MG tablet Take 100 mg by mouth  daily.     metFORMIN (GLUCOPHAGE-XR) 500 MG 24 hr tablet Take 500 mg by mouth 2 (two) times daily with a meal.   pantoprazole (PROTONIX) 40 MG tablet Take 1 tablet (40 mg total) by mouth daily.   rosuvastatin (CRESTOR) 5 MG tablet Take 5 mg by mouth daily.     warfarin (JANTOVEN) 5 MG tablet Take 7.5-10 mg by mouth See admin instructions. Take with 10 mg Mon Wed Fri and Sat, and 7.5 mg Sun Tues, and Thurs      ROS:   Please see the history of present illness.    All other systems reviewed and are negative.  EKGs       Risk Assessment/Calculations:    CHA2DS2-VASc Score = 4   This indicates a 4.8% annual risk of stroke. The patient's score is based upon: CHF History: 1 HTN History: 1 Diabetes History: 0 Stroke History: 0 Vascular Disease History: 0 Age Score: 2 Gender Score: 0          Physical Exam:    VS:  BP 112/66   Pulse 90    Ht 6\' 2"  (1.88 m)   Wt 177 lb (80.3 kg)   SpO2 100%   BMI 22.73 kg/m     Wt Readings from Last 3 Encounters:  11/30/23 177 lb (80.3 kg)  11/21/23 176 lb 9.4 oz (80.1 kg)  11/14/23 173 lb 3.2 oz (78.6 kg)     GEN: Well nourished, well developed in no acute distress NECK: No JVD CARDIAC: irreg irreg, 2/6 holosystolic murmur at apex. no rubs, gallops RESPIRATORY:  Clear to auscultation without rales, wheezing or rhonchi  ABDOMEN: Soft, non-tender, non-distended EXTREMITIES:  No edema; No deformity.   ASSESSMENT:    1. S/P TAVR (transcatheter aortic valve replacement)   2. Hematoma of groin, initial encounter   3. Gastroesophageal reflux disease, unspecified whether esophagitis present   4. Chronic diastolic CHF (congestive heart failure) (HCC)   5. Nonrheumatic mitral valve regurgitation   6. Permanent atrial fibrillation (HCC)   7. Primary hypertension   8. Pure hypercholesterolemia   9. Thrombus of left atrial appendage   10. Thyroid nodule     PLAN:    In order of problems listed above:  Severe AS s/p TAVR: -- Echo today shows EF 60%, mild RVE, normally functioning TAVR with a mean gradient of 7.7 mm hg and trivial PVL and mod MR/TR and severe pulm HTN. -- NYHA class II symptoms, mainly of fatigue and lack of energy.  -- Continue working with cardiac rehab.  -- SBE prophylaxis discussed; I have RX'd amoxicillin.  -- Continue Coumadin. -- I will see back for 1 year echo and OV.  Right groin hematoma: -- Vascular duplex 11/02/23 c/w hematoma.  -- This has improved a lot. Not examined today.   GERD -- Improved on daily pantoprazole 40mg  daily.    Chronic diastolic CHF: -- Appears euvolemic.  -- Not on standing diuretics. -- Occasional benopnea. Discussed trying low dose lasix but pt declined.    Mitral regurgitation:  -- Echo today with with severe MAC, mild MS and moderate MR.   Persistent atrial fibrillation: -- Rate well controlled.  -- Continue Cardizem CD  120mg  daily. -- Continue Coumadin.   HTN: -- BP well controlled today. -- Continue losartan 100mg  daily and Cardizem CD 120mg  daily.   HLD:  -- Continue Crestor 5mg  daily   LAA thrombus:  -- Pre TAVR CTs showed a large left atrial appendage with multiple filling  defects likely representing thrombus.  -- Continue on Coumadin. Bridged with Lovenox perioperatively.    Thyroid nodule:  -- Pre TAVR CTs showed a ~1.6 cm hypoechoic nodule in the left superior thyroid gland.  -- Thyroid US 11/07/23 showed a 1.3 cm left superior thyroid nodule (TI-RADS category 5) meeting criteria for fine-needle aspiration. -- FNA showed a benign follicular nodule.    Medication Adjustments/Labs and Tests Ordered: Current medicines are reviewed at length with the patient today.  Concerns regarding medicines are outlined above.  No orders of the defined types were placed in this encounter.  No orders of the defined types were placed in this encounter.   Patient Instructions  Medication Instructions:  Your physician recommends that you continue on your current medications as directed. Please refer to the Current Medication list given to you today.  *If you need a refill on your cardiac medications before your next appointment, please call your pharmacy*  Lab Work: NONE If you have labs (blood work) drawn today and your tests are completely normal, you will receive your results only by: MyChart Message (if you have MyChart) OR A paper copy in the mail If you have any lab test that is abnormal or we need to change your treatment, we will call you to review the results.  Testing/Procedures: NONE  Follow-Up: At [facility], you and your health needs are our priority.  As part of our continuing mission to provide you with exceptional heart care, our providers are all part of one team.  This team includes your primary Cardiologist (physician) and Advanced Practice Providers or APPs (Physician  Assistants and Nurse Practitioners) who all work together to provide you with the care you need, when you need it.  Your next appointment:   KEEP SCHEDULED FOLLOW-UP  We recommend signing up for the patient portal called "MyChart".  Sign up information is provided on this After Visit Summary.  MyChart is used to connect with patients for Virtual Visits (Telemedicine).  Patients are able to view lab/test results, encounter notes, upcoming appointments, etc.  Non-urgent messages can be sent to your provider as well.   To learn more about what you can do with MyChart, go to [email or web address].   Other Instructions       1st Floor: - Lobby - Registration  - Pharmacy  - Lab - Cafe  2nd Floor: - PV Lab - Diagnostic Testing (echo, CT, nuclear med)  3rd Floor: - Vacant  4th Floor: - TCTS (cardiothoracic surgery) - AFib Clinic - Structural Heart Clinic - Vascular Surgery  - Vascular Ultrasound  5th Floor: - HeartCare Cardiology (general and EP) - Clinical Pharmacy for coumadin, hypertension, lipid, weight-loss medications, and med management appointments    Valet parking services will be available as well.      Signed, Cline Crock, PA-C  11/30/2023 12:15 PM    [facility]

## 2023-11-29 DIAGNOSIS — Z7901 Long term (current) use of anticoagulants: Secondary | ICD-10-CM | POA: Diagnosis not present

## 2023-11-29 DIAGNOSIS — D6869 Other thrombophilia: Secondary | ICD-10-CM | POA: Diagnosis not present

## 2023-11-29 DIAGNOSIS — I480 Paroxysmal atrial fibrillation: Secondary | ICD-10-CM | POA: Diagnosis not present

## 2023-11-29 LAB — CYTOLOGY - NON PAP

## 2023-11-30 ENCOUNTER — Ambulatory Visit: Payer: Medicare HMO

## 2023-11-30 ENCOUNTER — Ambulatory Visit: Payer: Medicare HMO | Attending: Cardiology

## 2023-11-30 VITALS — BP 112/66 | HR 90 | Ht 74.0 in | Wt 177.0 lb

## 2023-11-30 DIAGNOSIS — E7800 Pure hypercholesterolemia, unspecified: Secondary | ICD-10-CM

## 2023-11-30 DIAGNOSIS — E041 Nontoxic single thyroid nodule: Secondary | ICD-10-CM

## 2023-11-30 DIAGNOSIS — I4821 Permanent atrial fibrillation: Secondary | ICD-10-CM | POA: Insufficient documentation

## 2023-11-30 DIAGNOSIS — I5032 Chronic diastolic (congestive) heart failure: Secondary | ICD-10-CM

## 2023-11-30 DIAGNOSIS — I513 Intracardiac thrombosis, not elsewhere classified: Secondary | ICD-10-CM

## 2023-11-30 DIAGNOSIS — Z952 Presence of prosthetic heart valve: Secondary | ICD-10-CM | POA: Insufficient documentation

## 2023-11-30 DIAGNOSIS — I10 Essential (primary) hypertension: Secondary | ICD-10-CM

## 2023-11-30 DIAGNOSIS — S301XXD Contusion of abdominal wall, subsequent encounter: Secondary | ICD-10-CM | POA: Diagnosis not present

## 2023-11-30 DIAGNOSIS — S301XXA Contusion of abdominal wall, initial encounter: Secondary | ICD-10-CM

## 2023-11-30 DIAGNOSIS — I340 Nonrheumatic mitral (valve) insufficiency: Secondary | ICD-10-CM | POA: Diagnosis not present

## 2023-11-30 DIAGNOSIS — K219 Gastro-esophageal reflux disease without esophagitis: Secondary | ICD-10-CM | POA: Diagnosis not present

## 2023-11-30 LAB — ECHOCARDIOGRAM COMPLETE
AV Mean grad: 7.7 mmHg
AV Peak grad: 14.5 mmHg
Ao pk vel: 1.9 m/s
MV M vel: 4.85 m/s
MV Peak grad: 94.2 mmHg
P 1/2 time: 599 ms
S' Lateral: 3.6 cm

## 2023-11-30 NOTE — Patient Instructions (Signed)
 Medication Instructions:  Your physician recommends that you continue on your current medications as directed. Please refer to the Current Medication list given to you today.  *If you need a refill on your cardiac medications before your next appointment, please call your pharmacy*  Lab Work: NONE  If you have labs (blood work) drawn today and your tests are completely normal, you will receive your results only by: MyChart Message (if you have MyChart) OR A paper copy in the mail If you have any lab test that is abnormal or we need to change your treatment, we will call you to review the results.  Testing/Procedures: NONE  Follow-Up: At [facility], you and your health needs are our priority.  As part of our continuing mission to provide you with exceptional heart care, our providers are all part of one team.  This team includes your primary Cardiologist (physician) and Advanced Practice Providers or APPs (Physician Assistants and Nurse Practitioners) who all work together to provide you with the care you need, when you need it.  Your next appointment:   KEEP SCHEDULED FOLLOW-UP  We recommend signing up for the patient portal called "MyChart".  Sign up information is provided on this After Visit Summary.  MyChart is used to connect with patients for Virtual Visits (Telemedicine).  Patients are able to view lab/test results, encounter notes, upcoming appointments, etc.  Non-urgent messages can be sent to your provider as well.   To learn more about what you can do with MyChart, go to [email or web address].   Other Instructions       1st Floor: - Lobby - Registration  - Pharmacy  - Lab - Cafe  2nd Floor: - PV Lab - Diagnostic Testing (echo, CT, nuclear med)  3rd Floor: - Vacant  4th Floor: - TCTS (cardiothoracic surgery) - AFib Clinic - Structural Heart Clinic - Vascular Surgery  - Vascular Ultrasound  5th Floor: - HeartCare Cardiology (general and EP) -  Clinical Pharmacy for coumadin, hypertension, lipid, weight-loss medications, and med management appointments    Valet parking services will be available as well.

## 2023-12-03 ENCOUNTER — Encounter (HOSPITAL_COMMUNITY)
Admission: RE | Admit: 2023-12-03 | Discharge: 2023-12-03 | Disposition: A | Discharge status: Home / Self Care | Source: Ambulatory Visit | Attending: Cardiology | Admitting: Cardiology

## 2023-12-03 DIAGNOSIS — Z952 Presence of prosthetic heart valve: Secondary | ICD-10-CM | POA: Diagnosis not present

## 2023-12-05 ENCOUNTER — Encounter (HOSPITAL_COMMUNITY)
Admission: RE | Admit: 2023-12-05 | Discharge: 2023-12-05 | Disposition: A | Discharge status: Home / Self Care | Source: Ambulatory Visit | Attending: Cardiology | Admitting: Cardiology

## 2023-12-05 DIAGNOSIS — Z952 Presence of prosthetic heart valve: Secondary | ICD-10-CM | POA: Diagnosis not present

## 2023-12-07 ENCOUNTER — Encounter (HOSPITAL_COMMUNITY)
Admission: RE | Admit: 2023-12-07 | Discharge: 2023-12-07 | Disposition: A | Discharge status: Home / Self Care | Source: Ambulatory Visit | Attending: Cardiology | Admitting: Cardiology

## 2023-12-07 DIAGNOSIS — Z952 Presence of prosthetic heart valve: Secondary | ICD-10-CM

## 2023-12-10 ENCOUNTER — Encounter (HOSPITAL_COMMUNITY)
Admission: RE | Admit: 2023-12-10 | Discharge: 2023-12-10 | Disposition: A | Discharge status: Home / Self Care | Source: Ambulatory Visit | Attending: Cardiology | Admitting: Cardiology

## 2023-12-10 DIAGNOSIS — Z952 Presence of prosthetic heart valve: Secondary | ICD-10-CM | POA: Diagnosis not present

## 2023-12-12 ENCOUNTER — Encounter (HOSPITAL_COMMUNITY)
Admission: RE | Admit: 2023-12-12 | Discharge: 2023-12-12 | Disposition: A | Discharge status: Home / Self Care | Source: Ambulatory Visit | Attending: Cardiology | Admitting: Cardiology

## 2023-12-12 DIAGNOSIS — Z952 Presence of prosthetic heart valve: Secondary | ICD-10-CM

## 2023-12-12 LAB — GLUCOSE, CAPILLARY: Glucose-Capillary: 232 mg/dL — ABNORMAL HIGH (ref 70–99)

## 2023-12-14 ENCOUNTER — Encounter (HOSPITAL_COMMUNITY)
Admission: RE | Admit: 2023-12-14 | Discharge: 2023-12-14 | Disposition: A | Discharge status: Home / Self Care | Source: Ambulatory Visit | Attending: Cardiology | Admitting: Cardiology

## 2023-12-14 DIAGNOSIS — Z952 Presence of prosthetic heart valve: Secondary | ICD-10-CM | POA: Diagnosis not present

## 2023-12-17 ENCOUNTER — Encounter (HOSPITAL_COMMUNITY)
Admission: RE | Admit: 2023-12-17 | Discharge: 2023-12-17 | Disposition: A | Discharge status: Home / Self Care | Source: Ambulatory Visit | Attending: Cardiology | Admitting: Cardiology

## 2023-12-17 DIAGNOSIS — Z952 Presence of prosthetic heart valve: Secondary | ICD-10-CM | POA: Diagnosis not present

## 2023-12-17 NOTE — Progress Notes (Signed)
 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 1 hour prior to dental work including cleanings, Disp: 12 tablet, Rfl: 12   ascorbic acid (VITAMIN C) 500 MG tablet, Take 500 mg by mouth daily., Disp: , Rfl:    Cholecalciferol (VITAMIN D3) 1000 units CAPS, Take 1,000 Units by mouth daily., Disp: , Rfl:    diltiazem  (CARDIZEM  CD) 120 MG 24 hr capsule, Take 1 capsule (120 mg total) by mouth daily., Disp: 90 capsule, Rfl: 3   glucose blood (ONETOUCH VERIO) test strip, Use to check blood sugar 2 times a day as directed Dx E11.4, Disp: , Rfl:    Lancets (ONETOUCH ULTRASOFT) lancets, use for OneTouch Ultra meter to test up to bid, Disp: , Rfl:    losartan (COZAAR) 100 MG tablet, Take 100 mg by mouth daily.  , Disp: , Rfl:    metFORMIN (GLUCOPHAGE-XR) 500 MG 24 hr tablet, Take 500 mg by mouth 2 (two) times daily with a meal., Disp: , Rfl:    pantoprazole  (PROTONIX ) 40 MG tablet, Take 1 tablet (40 mg total) by mouth daily., Disp: 30 tablet, Rfl: 2   rosuvastatin  (CRESTOR ) 5 MG tablet, Take 5 mg by mouth daily.  , Disp: , Rfl:    warfarin (JANTOVEN) 5 MG tablet, Take 7.5-10 mg by mouth See admin instructions. Take with 10 mg Mon Wed Fri and Sat, and 7.5 mg Sun Tues, and Thurs, Disp: , Rfl:   Past Medical History: Past Medical History:  Diagnosis Date    Arthritis    Chronic atrial fibrillation (HCC)    History of radiation therapy    Hyperlipidemia    Hypertension    Prostate cancer (HCC)    Severe aortic stenosis     Tobacco Use: Social History   Tobacco Use  Smoking Status Never  Smokeless Tobacco Never    Labs: Review Flowsheet       Latest Ref Rng & Units 01/02/2011 09/19/2023 10/23/2023  Labs for ITP Cardiac and Pulmonary Rehab  Cholestrol 0 - 200 mg/dL 829  - -  LDL (calc) 0 - 99 mg/dL 63  - -  HDL-C >56.21 mg/dL 30.86  - -  Trlycerides 0.0 - 149.0 mg/dL 578.4  - -  PH, Arterial 7.35 - 7.45 - 7.384  -  PCO2 arterial 32 - 48 mmHg - 37.5  -  Bicarbonate 20.0 - 28.0 mmol/L - 22.4  23.4  24.5  -  TCO2 22 - 32 mmol/L - 24  25  26  24    Acid-base deficit 0.0 - 2.0 mmol/L - 2.0  2.0  1.0  -  O2 Saturation % - 99  71  70  -    Details       Multiple values from one day are sorted in reverse-chronological order         Capillary Blood Glucose:  Lab Results  Component Value Date   GLUCAP 232 (H) 12/12/2023   GLUCAP 181 (H) 11/28/2023   GLUCAP 228 (H) 11/28/2023   GLUCAP 173 (H) 11/26/2023   GLUCAP 215 (H) 11/26/2023     Exercise Target Goals: Exercise Program Goal: Individual exercise prescription set using results from initial 6 min walk test and THRR while considering  patient's activity barriers and safety.   Exercise Prescription Goal: Initial exercise prescription builds to 30-45 minutes a day of aerobic activity, 2-3 days per week.  Home exercise guidelines will be given to patient during program as part of exercise prescription that the participant will acknowledge.  Activity Barriers & Risk Stratification:  Activity Barriers & Cardiac Risk Stratification - 11/21/23 1423       Activity Barriers & Cardiac Risk Stratification   Activity Barriers Joint Problems;Deconditioning;Back Problems;Incisional Pain;Decreased Ventricular Function;Balance Concerns;Neck/Spine Problems;History of Falls    Cardiac  Risk Stratification High   <5 METs on            6 Minute Walk:  6 Minute Walk     Row Name 11/21/23 1551         6 Minute Walk   Phase Initial     Distance 1140 feet     Walk Time 6 minutes     # of Rest Breaks 1  3:00-3:30 Over THR     MPH 2.16     METS 2.17     RPE 13     Perceived Dyspnea  0     VO2 Peak 7.59     Symptoms Yes (comment)     Comments break from 3:00-3:30 due to over THR. 5/10 R leg chronic pain, resolved with rest     Resting HR 65 bpm     Resting BP 122/70     Resting Oxygen Saturation  97 %     Exercise Oxygen Saturation  during 6 min walk 97 %     Max Ex. HR 113 bpm     Max Ex. BP 132/66     2 Minute Post BP 110/64              Oxygen Initial Assessment:   Oxygen Re-Evaluation:   Oxygen Discharge (Final Oxygen Re-Evaluation):   Initial Exercise Prescription:  Initial Exercise Prescription - 11/21/23 1500       Date of Initial Exercise RX and Referring Provider   Date 11/21/23    Referring Provider Sheryle Donning, MD    Expected Discharge Date 02/13/24      Recumbant Bike   Level 1    RPM 40    Watts 12    Minutes 15    METs 2      NuStep   Level 1    SPM 75    Minutes 15    METs 2      Prescription Details   Frequency (times per week) 3    Duration Progress to 30 minutes of continuous aerobic without signs/symptoms of physical distress      Intensity   THRR 40-80% of Max Heartrate 52-104    Ratings of Perceived Exertion 11-13    Perceived Dyspnea 0-4      Progression   Progression Continue progressive overload as per policy without signs/symptoms or physical distress.      Resistance Training   Training Prescription Yes    Weight 2    Reps 10-15  Perform Capillary Blood Glucose checks as needed.  Exercise Prescription Changes:   Exercise Prescription Changes     Row Name 11/26/23 1200 12/14/23 1600           Response to Exercise   Blood Pressure (Admit) 132/64 120/64       Blood Pressure (Exercise) 140/80 140/62      Blood Pressure (Exit) 102/60 110/68      Heart Rate (Admit) 64 bpm 87 bpm      Heart Rate (Exercise) 109 bpm 115 bpm      Heart Rate (Exit) 73 bpm 74 bpm      Rating of Perceived Exertion (Exercise) 9 11      Symptoms None none      Comments Pt's first day in the CRP2 program Reviewed METs and goals      Duration Continue with 30 min of aerobic exercise without signs/symptoms of physical distress. Continue with 30 min of aerobic exercise without signs/symptoms of physical distress.      Intensity THRR unchanged THRR unchanged        Progression   Progression Continue to progress workloads to maintain intensity without signs/symptoms of physical distress. Continue to progress workloads to maintain intensity without signs/symptoms of physical distress.      Average METs 2.15 2.3        Resistance Training   Training Prescription Yes Yes      Weight 2 lbs 3 lbs      Reps 10-15 10-15      Time 10 Minutes 10 Minutes        Interval Training   Interval Training No No        Recumbant Bike   Level 1 1      RPM 57 75      Watts 14 18      Minutes 15 15      METs 2 2        NuStep   Level 1 1      SPM 84 93      Minutes 15 15      METs 2.3 2.6               Exercise Comments:   Exercise Comments     Row Name 11/26/23 1209 12/14/23 0800         Exercise Comments Pt's first day in the CRP2 program. Pt exercised without complaints today. Reviewed METs with patient today. Pt is making slow, steday progress. Pt voices that he does not know how long he will attend due to his $40 copay. Discussed options with patient. Will await his decision.               Exercise Goals and Review:   Exercise Goals     Row Name 11/21/23 1322             Exercise Goals   Increase Physical Activity Yes       Intervention Develop an individualized exercise prescription for aerobic and resistive training based on initial evaluation  findings, risk stratification, comorbidities and participant's personal goals.;Provide advice, education, support and counseling about physical activity/exercise needs.       Expected Outcomes Short Term: Attend rehab on a regular basis to increase amount of physical activity.;Long Term: Exercising regularly at least 3-5 days a week.;Long Term: Add in home exercise to make exercise part of routine and to increase amount of physical activity.       Increase Strength and  Stamina Yes       Intervention Develop an individualized exercise prescription for aerobic and resistive training based on initial evaluation findings, risk stratification, comorbidities and participant's personal goals.;Provide advice, education, support and counseling about physical activity/exercise needs.       Expected Outcomes Short Term: Increase workloads from initial exercise prescription for resistance, speed, and METs.;Short Term: Perform resistance training exercises routinely during rehab and add in resistance training at home;Long Term: Improve cardiorespiratory fitness, muscular endurance and strength as measured by increased METs and functional capacity ( )       Able to understand and use rate of perceived exertion (RPE) scale Yes       Intervention Provide education and explanation on how to use RPE scale       Expected Outcomes Short Term: Able to use RPE daily in rehab to express subjective intensity level;Long Term:  Able to use RPE to guide intensity level when exercising independently       Knowledge and understanding of Target Heart Rate Range (THRR) Yes       Intervention Provide education and explanation of THRR including how the numbers were predicted and where they are located for reference       Expected Outcomes Short Term: Able to state/look up THRR;Short Term: Able to use daily as guideline for intensity in rehab;Long Term: Able to use THRR to govern intensity when exercising independently        Understanding of Exercise Prescription Yes       Intervention Provide education, explanation, and written materials on patient's individual exercise prescription       Expected Outcomes Short Term: Able to explain program exercise prescription;Long Term: Able to explain home exercise prescription to exercise independently                Exercise Goals Re-Evaluation :  Exercise Goals Re-Evaluation     Row Name 11/26/23 1208             Exercise Goal Re-Evaluation   Exercise Goals Review Increase Physical Activity;Understanding of Exercise Prescription;Increase Strength and Stamina;Knowledge and understanding of Target Heart Rate Range (THRR);Able to understand and use rate of perceived exertion (RPE) scale       Comments Pt's first day in the CRP2 program. Pt understands the exercise Rx, RPE sclae and THRR.       Expected Outcomes Will continue to monitor patient and progress exercise workloads as tolerated.                Discharge Exercise Prescription (Final Exercise Prescription Changes):  Exercise Prescription Changes - 12/14/23 1600       Response to Exercise   Blood Pressure (Admit) 120/64    Blood Pressure (Exercise) 140/62    Blood Pressure (Exit) 110/68    Heart Rate (Admit) 87 bpm    Heart Rate (Exercise) 115 bpm    Heart Rate (Exit) 74 bpm    Rating of Perceived Exertion (Exercise) 11    Symptoms none    Comments Reviewed METs and goals    Duration Continue with 30 min of aerobic exercise without signs/symptoms of physical distress.    Intensity THRR unchanged      Progression   Progression Continue to progress workloads to maintain intensity without signs/symptoms of physical distress.    Average METs 2.3      Resistance Training   Training Prescription Yes    Weight 3 lbs    Reps 10-15    Time 10 Minutes  Interval Training   Interval Training No      Recumbant Bike   Level 1    RPM 75    Watts 18    Minutes 15    METs 2      NuStep    Level 1    SPM 93    Minutes 15    METs 2.6             Nutrition:  Target Goals: Understanding of nutrition guidelines, daily intake of sodium 1500mg , cholesterol 200mg , calories 30% from fat and 7% or less from saturated fats, daily to have 5 or more servings of fruits and vegetables.  Biometrics:    Nutrition Therapy Plan and Nutrition Goals:  Nutrition Therapy & Goals - 11/26/23 1034       Nutrition Therapy   Diet Heart Healthy Diet    Drug/Food Interactions Statins/Certain Fruits;Coumadin/Vit K      Personal Nutrition Goals   Nutrition Goal Patient to identify strategies for reducing cardiovascular risk by attending the Pritikin education and nutrition series weekly.    Personal Goal #2 Patient to improve diet quality by using the plate method as a guide for meal planning to include lean protein/plant protein, fruits, vegetables, whole grains, nonfat dairy as part of a well-balanced diet.    Comments Cara has medical history of HTN, afib, hyperlipidemia, s/pTAVR, DM2. A1c is well controlled in a pre-diabetic range. Patient will benefit from participation in intensive cardiac rehab for nutrition, exercise, and lifestyle modification.      Intervention Plan   Intervention Prescribe, educate and counsel regarding individualized specific dietary modifications aiming towards targeted core components such as weight, hypertension, lipid management, diabetes, heart failure and other comorbidities.;Nutrition handout(s) given to patient.    Expected Outcomes Short Term Goal: Understand basic principles of dietary content, such as calories, fat, sodium, cholesterol and nutrients.;Long Term Goal: Adherence to prescribed nutrition plan.             Nutrition Assessments:  MEDIFICTS Score Key: >=70 Need to make dietary changes  40-70 Heart Healthy Diet <= 40 Therapeutic Level Cholesterol Diet    Picture Your Plate Scores: <16 Unhealthy dietary pattern with much room for  improvement. 41-50 Dietary pattern unlikely to meet recommendations for good health and room for improvement. 51-60 More healthful dietary pattern, with some room for improvement.  >60 Healthy dietary pattern, although there may be some specific behaviors that could be improved.    Nutrition Goals Re-Evaluation:  Nutrition Goals Re-Evaluation     Row Name 11/26/23 1034             Goals   Current Weight 177 lb 7.5 oz (80.5 kg)       Comment no recent lipid panel available for review. A1c 6.3       Expected Outcome Kallon has medical history of HTN, afib, hyperlipidemia, s/pTAVR, DM2. A1c is well controlled in a pre-diabetic range. Patient will benefit from participation in intensive cardiac rehab for nutrition, exercise, and lifestyle modification.                Nutrition Goals Re-Evaluation:  Nutrition Goals Re-Evaluation     Row Name 11/26/23 1034             Goals   Current Weight 177 lb 7.5 oz (80.5 kg)       Comment no recent lipid panel available for review. A1c 6.3       Expected Outcome Admiral has medical history of  HTN, afib, hyperlipidemia, s/pTAVR, DM2. A1c is well controlled in a pre-diabetic range. Patient will benefit from participation in intensive cardiac rehab for nutrition, exercise, and lifestyle modification.                Nutrition Goals Discharge (Final Nutrition Goals Re-Evaluation):  Nutrition Goals Re-Evaluation - 11/26/23 1034       Goals   Current Weight 177 lb 7.5 oz (80.5 kg)    Comment no recent lipid panel available for review. A1c 6.3    Expected Outcome Link has medical history of HTN, afib, hyperlipidemia, s/pTAVR, DM2. A1c is well controlled in a pre-diabetic range. Patient will benefit from participation in intensive cardiac rehab for nutrition, exercise, and lifestyle modification.             Psychosocial: Target Goals: Acknowledge presence or absence of significant depression and/or stress, maximize coping skills,  provide positive support system. Participant is able to verbalize types and ability to use techniques and skills needed for reducing stress and depression.  Initial Review & Psychosocial Screening:  Initial Psych Review & Screening - 11/21/23 1423       Initial Review   Current issues with None Identified      Family Dynamics   Good Support System? Yes   Wife for support   Comments Masaji is frustrated by his continued lack of energy since his TAVR. He'd hoped that the procedure would help with his low energy, however he is still finding some daily tasks difficult. He denies any feelings of anxiety/depression/stress or need for additional support.      Barriers   Psychosocial barriers to participate in program There are no identifiable barriers or psychosocial needs.      Screening Interventions   Interventions Encouraged to exercise;Provide feedback about the scores to participant    Expected Outcomes Short Term goal: Identification and review with participant of any Quality of Life or Depression concerns found by scoring the questionnaire.;Long Term goal: The participant improves quality of Life and PHQ9 Scores as seen by post scores and/or verbalization of changes             Quality of Life Scores:  Quality of Life - 11/21/23 1448       Quality of Life   Select Quality of Life      Quality of Life Scores   Health/Function Pre 22.04 %    Socioeconomic Pre 27.14 %    Psych/Spiritual Pre 27.43 %    Family Pre 28.8 %    GLOBAL Pre 25.39 %            Scores of 19 and below usually indicate a poorer quality of life in these areas.  A difference of  2-3 points is a clinically meaningful difference.  A difference of 2-3 points in the total score of the Quality of Life Index has been associated with significant improvement in overall quality of life, self-image, physical symptoms, and general health in studies assessing change in quality of life.  PHQ-9: Review Flowsheet        11/21/2023  Depression screen PHQ 2/9  Decreased Interest 1  Down, Depressed, Hopeless 0  PHQ - 2 Score 1  Altered sleeping 1  Tired, decreased energy 3  Change in appetite 1  Feeling bad or failure about yourself  0  Trouble concentrating 0  Moving slowly or fidgety/restless 0  Suicidal thoughts 0  PHQ-9 Score 6  Difficult doing work/chores Not difficult at all  Interpretation of Total Score  Total Score Depression Severity:  1-4 = Minimal depression, 5-9 = Mild depression, 10-14 = Moderate depression, 15-19 = Moderately severe depression, 20-27 = Severe depression   Psychosocial Evaluation and Intervention:   Psychosocial Re-Evaluation:  Psychosocial Re-Evaluation     Row Name 11/26/23 8295 12/13/23 1328           Psychosocial Re-Evaluation   Current issues with Current Stress Concerns Current Stress Concerns      Comments Adrian says he is not having any problems sleeping but does admit to having low energy since TAVR surgery. Jamaurie says he may feel this way due to his age. Denies being depressed. Leory has not voiced any increased concernsor stressors during exercise at cardiac rehab.      Expected Outcomes Darol will have controlled or reduced stressors upon completion of cardiac rehab Montrail will have controlled or reduced stressors upon completion of cardiac rehab      Interventions Stress management education;Relaxation education;Encouraged to attend Cardiac Rehabilitation for the exercise Stress management education;Relaxation education;Encouraged to attend Cardiac Rehabilitation for the exercise      Continue Psychosocial Services  No Follow up required No Follow up required        Initial Review   Source of Stress Concerns Chronic Illness Chronic Illness      Comments Will continue to monitor and offer support as needed Will continue to monitor and offer support as needed               Psychosocial Discharge (Final Psychosocial Re-Evaluation):   Psychosocial Re-Evaluation - 12/13/23 1328       Psychosocial Re-Evaluation   Current issues with Current Stress Concerns    Comments Maciah has not voiced any increased concernsor stressors during exercise at cardiac rehab.    Expected Outcomes Ishmeal will have controlled or reduced stressors upon completion of cardiac rehab    Interventions Stress management education;Relaxation education;Encouraged to attend Cardiac Rehabilitation for the exercise    Continue Psychosocial Services  No Follow up required      Initial Review   Source of Stress Concerns Chronic Illness    Comments Will continue to monitor and offer support as needed             Vocational Rehabilitation: Provide vocational rehab assistance to qualifying candidates.   Vocational Rehab Evaluation & Intervention:  Vocational Rehab - 11/21/23 1426       Initial Vocational Rehab Evaluation & Intervention   Assessment shows need for Vocational Rehabilitation No   Koren is retired            Education: Education Goals: Education classes will be provided on a weekly basis, covering required topics. Participant will state understanding/return demonstration of topics presented.    Education     Row Name 11/26/23 0900     Education   Cardiac Education Topics Pritikin   Select Core Videos     Core Videos   Educator Exercise Physiologist   Select Exercise Education   Exercise Education Biomechanial Limitations   Instruction Review Code 1- Verbalizes Understanding   Class Start Time 0808   Class Stop Time 0841   Class Time Calculation (min) 33 min    Row Name 11/28/23 0900     Education   Cardiac Education Topics Pritikin   Customer service manager   Weekly Topic Fast Evening Meals   Instruction Review Code 1- [employer] Understanding  Class Start Time 0815   Class Stop Time 0853   Class Time Calculation (min) 38 min    Row Name 12/03/23 0800     Education    Cardiac Education Topics Pritikin   Select Core Videos     Core Videos   Educator Dietitian   Select Nutrition   Nutrition Fueling a Healthy Body   Instruction Review Code 1- Verbalizes Understanding   Class Start Time 0815   Class Stop Time 0900   Class Time Calculation (min) 45 min    Row Name 12/05/23 0900     Education   Cardiac Education Topics Pritikin   Customer service manager   Weekly Topic International Cuisine- Spotlight on the [facility] Zones   Instruction Review Code 1- Verbalizes Understanding   Class Start Time 0815   Class Stop Time 0850   Class Time Calculation (min) 35 min    Row Name 12/07/23 0800     Education   Cardiac Education Topics Pritikin   Select Core Videos     Core Videos   Educator Exercise Physiologist   Select Exercise Education   Exercise Education Improving Performance   Instruction Review Code 1- Verbalizes Understanding   Class Start Time [phone]   Class Stop Time 0848   Class Time Calculation (min) 37 min    Row Name 12/10/23 0900     Education   Cardiac Education Topics Pritikin   Select Workshops     Workshops   Educator Exercise Physiologist   Select Psychosocial   Psychosocial Workshop Healthy Sleep for a Healthy Heart   Instruction Review Code 1- Verbalizes Understanding   Class Start Time [phone]   Class Stop Time 0858   Class Time Calculation (min) 46 min    Row Name 12/12/23 0900     Education   Cardiac Education Topics Pritikin   Secondary school teacher School   Educator Dietitian   Weekly Topic Simple Sides and Sauces   Instruction Review Code 1- Verbalizes Understanding   Class Start Time 0815   Class Stop Time 0855   Class Time Calculation (min) 40 min    Row Name 12/14/23 0800     Education   Cardiac Education Topics Pritikin   Select Core Videos     Core Videos   Educator Exercise Physiologist   Select Psychosocial   Psychosocial How Our Thoughts  Can Heal Our Hearts   Instruction Review Code 1- Verbalizes Understanding   Class Start Time [phone]   Class Stop Time 0848   Class Time Calculation (min) 36 min    Row Name 12/17/23 0800     Education   Cardiac Education Topics Pritikin   Select Workshops     Workshops   Educator Exercise Physiologist   Select Exercise   Exercise Workshop Managing Heart Disease: Your Path to a Healthier Heart   Instruction Review Code 1- Verbalizes Understanding            Core Videos: Exercise    Move It!  Clinical staff conducted group or individual video education with verbal and written material and guidebook.  Patient learns the recommended Pritikin exercise program. Exercise with the goal of living a long, healthy life. Some of the health benefits of exercise include controlled diabetes, healthier blood pressure levels, improved cholesterol levels, improved heart and lung capacity, improved sleep, and better body composition. Everyone should speak with their doctor  before starting or changing an exercise routine.  Biomechanical Limitations Clinical staff conducted group or individual video education with verbal and written material and guidebook.  Patient learns how biomechanical limitations can impact exercise and how we can mitigate and possibly overcome limitations to have an impactful and balanced exercise routine.  Body Composition Clinical staff conducted group or individual video education with verbal and written material and guidebook.  Patient learns that body composition (ratio of muscle mass to fat mass) is a key component to assessing overall fitness, rather than body weight alone. Increased fat mass, especially visceral belly fat, can put us  at increased risk for metabolic syndrome, type 2 diabetes, heart disease, and even death. It is recommended to combine diet and exercise (cardiovascular and resistance training) to improve your body composition. Seek guidance from your  physician and exercise physiologist before implementing an exercise routine.  Exercise Action Plan Clinical staff conducted group or individual video education with verbal and written material and guidebook.  Patient learns the recommended strategies to achieve and enjoy long-term exercise adherence, including variety, self-motivation, self-efficacy, and positive decision making. Benefits of exercise include fitness, good health, weight management, more energy, better sleep, less stress, and overall well-being.  Medical   Heart Disease Risk Reduction Clinical staff conducted group or individual video education with verbal and written material and guidebook.  Patient learns our heart is our most vital organ as it circulates oxygen, nutrients, white blood cells, and hormones throughout the entire body, and carries waste away. Data supports a plant-based eating plan like the Pritikin Program for its effectiveness in slowing progression of and reversing heart disease. The video provides a number of recommendations to address heart disease.   Metabolic Syndrome and Belly Fat  Clinical staff conducted group or individual video education with verbal and written material and guidebook.  Patient learns what metabolic syndrome is, how it leads to heart disease, and how one can reverse it and keep it from coming back. You have metabolic syndrome if you have 3 of the following 5 criteria: abdominal obesity, high blood pressure, high triglycerides, low HDL cholesterol, and high blood sugar.  Hypertension and Heart Disease Clinical staff conducted group or individual video education with verbal and written material and guidebook.  Patient learns that high blood pressure, or hypertension, is very common in the [place] . Hypertension is largely due to excessive salt intake, but other important risk factors include being overweight, physical inactivity, drinking too much alcohol , smoking, and not eating enough  potassium from fruits and vegetables. High blood pressure is a leading risk factor for heart attack, stroke, congestive heart failure, dementia, kidney failure, and premature death. Long-term effects of excessive salt intake include stiffening of the arteries and thickening of heart muscle and organ damage. Recommendations include ways to reduce hypertension and the risk of heart disease.  Diseases of Our Time - Focusing on Diabetes Clinical staff conducted group or individual video education with verbal and written material and guidebook.  Patient learns why the best way to stop diseases of our time is prevention, through food and other lifestyle changes. Medicine (such as prescription pills and surgeries) is often only a Band-Aid on the problem, not a long-term solution. Most common diseases of our time include obesity, type 2 diabetes, hypertension, heart disease, and cancer. The Pritikin Program is recommended and has been proven to help reduce, reverse, and/or prevent the damaging effects of metabolic syndrome.  Nutrition   Overview of the Pritikin Eating Plan  Clinical  staff conducted group or individual video education with verbal and written material and guidebook.  Patient learns about the Pritikin Eating Plan for disease risk reduction. The Pritikin Eating Plan emphasizes a wide variety of unrefined, minimally-processed carbohydrates, like fruits, vegetables, whole grains, and legumes. Go, Caution, and Stop food choices are explained. Plant-based and lean animal proteins are emphasized. Rationale provided for low sodium intake for blood pressure control, low added sugars for blood sugar stabilization, and low added fats and oils for coronary artery disease risk reduction and weight management.  Calorie Density  Clinical staff conducted group or individual video education with verbal and written material and guidebook.  Patient learns about calorie density and how it impacts the Pritikin Eating  Plan. Knowing the characteristics of the food you choose will help you decide whether those foods will lead to weight gain or weight loss, and whether you want to consume more or less of them. Weight loss is usually a side effect of the Pritikin Eating Plan because of its focus on low calorie-dense foods.  Label Reading  Clinical staff conducted group or individual video education with verbal and written material and guidebook.  Patient learns about the Pritikin recommended label reading guidelines and corresponding recommendations regarding calorie density, added sugars, sodium content, and whole grains.  Dining Out - Part 1  Clinical staff conducted group or individual video education with verbal and written material and guidebook.  Patient learns that restaurant meals can be sabotaging because they can be so high in calories, fat, sodium, and/or sugar. Patient learns recommended strategies on how to positively address this and avoid unhealthy pitfalls.  Facts on Fats  Clinical staff conducted group or individual video education with verbal and written material and guidebook.  Patient learns that lifestyle modifications can be just as effective, if not more so, as many medications for lowering your risk of heart disease. A Pritikin lifestyle can help to reduce your risk of inflammation and atherosclerosis (cholesterol build-up, or plaque, in the artery walls). Lifestyle interventions such as dietary choices and physical activity address the cause of atherosclerosis. A review of the types of fats and their impact on blood cholesterol levels, along with dietary recommendations to reduce fat intake is also included.  Nutrition Action Plan  Clinical staff conducted group or individual video education with verbal and written material and guidebook.  Patient learns how to incorporate Pritikin recommendations into their lifestyle. Recommendations include planning and keeping personal health goals in mind  as an important part of their success.  Healthy Mind-Set    Healthy Minds, Bodies, Hearts  Clinical staff conducted group or individual video education with verbal and written material and guidebook.  Patient learns how to identify when they are stressed. Video will discuss the impact of that stress, as well as the many benefits of stress management. Patient will also be introduced to stress management techniques. The way we think, act, and feel has an impact on our hearts.  How Our Thoughts Can Heal Our Hearts  Clinical staff conducted group or individual video education with verbal and written material and guidebook.  Patient learns that negative thoughts can cause depression and anxiety. This can result in negative lifestyle behavior and serious health problems. Cognitive behavioral therapy is an effective method to help control our thoughts in order to change and improve our emotional outlook.  Additional Videos:  Exercise    Improving Performance  Clinical staff conducted group or individual video education with verbal and written material and  guidebook.  Patient learns to use a non-linear approach by alternating intensity levels and lengths of time spent exercising to help burn more calories and lose more body fat. Cardiovascular exercise helps improve heart health, metabolism, hormonal balance, blood sugar control, and recovery from fatigue. Resistance training improves strength, endurance, balance, coordination, reaction time, metabolism, and muscle mass. Flexibility exercise improves circulation, posture, and balance. Seek guidance from your physician and exercise physiologist before implementing an exercise routine and learn your capabilities and proper form for all exercise.  Introduction to Yoga  Clinical staff conducted group or individual video education with verbal and written material and guidebook.  Patient learns about yoga, a discipline of the coming together of mind, breath,  and body. The benefits of yoga include improved flexibility, improved range of motion, better posture and core strength, increased lung function, weight loss, and positive self-image. Yoga's heart health benefits include lowered blood pressure, healthier heart rate, decreased cholesterol and triglyceride levels, improved immune function, and reduced stress. Seek guidance from your physician and exercise physiologist before implementing an exercise routine and learn your capabilities and proper form for all exercise.  Medical   Aging: Enhancing Your Quality of Life  Clinical staff conducted group or individual video education with verbal and written material and guidebook.  Patient learns key strategies and recommendations to stay in good physical health and enhance quality of life, such as prevention strategies, having an advocate, securing a Health Care Proxy and Power of Attorney, and keeping a list of medications and system for tracking them. It also discusses how to avoid risk for bone loss.  Biology of Weight Control  Clinical staff conducted group or individual video education with verbal and written material and guidebook.  Patient learns that weight gain occurs because we consume more calories than we burn (eating more, moving less). Even if your body weight is normal, you may have higher ratios of fat compared to muscle mass. Too much body fat puts you at increased risk for cardiovascular disease, heart attack, stroke, type 2 diabetes, and obesity-related cancers. In addition to exercise, following the Pritikin Eating Plan can help reduce your risk.  Decoding Lab Results  Clinical staff conducted group or individual video education with verbal and written material and guidebook.  Patient learns that lab test reflects one measurement whose values change over time and are influenced by many factors, including medication, stress, sleep, exercise, food, hydration, pre-existing medical conditions,  and more. It is recommended to use the knowledge from this video to become more involved with your lab results and evaluate your numbers to speak with your doctor.   Diseases of Our Time - Overview  Clinical staff conducted group or individual video education with verbal and written material and guidebook.  Patient learns that according to the CDC, 50% to 70% of chronic diseases (such as obesity, type 2 diabetes, elevated lipids, hypertension, and heart disease) are avoidable through lifestyle improvements including healthier food choices, listening to satiety cues, and increased physical activity.  Sleep Disorders Clinical staff conducted group or individual video education with verbal and written material and guidebook.  Patient learns how good quality and duration of sleep are important to overall health and well-being. Patient also learns about sleep disorders and how they impact health along with recommendations to address them, including discussing with a physician.  Nutrition  Dining Out - Part 2 Clinical staff conducted group or individual video education with verbal and written material and guidebook.  Patient learns how to  plan ahead and communicate in order to maximize their dining experience in a healthy and nutritious manner. Included are recommended food choices based on the type of restaurant the patient is visiting.   Fueling a Banker conducted group or individual video education with verbal and written material and guidebook.  There is a strong connection between our food choices and our health. Diseases like obesity and type 2 diabetes are very prevalent and are in large-part due to lifestyle choices. The Pritikin Eating Plan provides plenty of food and hunger-curbing satisfaction. It is easy to follow, affordable, and helps reduce health risks.  Menu Workshop  Clinical staff conducted group or individual video education with verbal and written material  and guidebook.  Patient learns that restaurant meals can sabotage health goals because they are often packed with calories, fat, sodium, and sugar. Recommendations include strategies to plan ahead and to communicate with the manager, chef, or server to help order a healthier meal.  Planning Your Eating Strategy  Clinical staff conducted group or individual video education with verbal and written material and guidebook.  Patient learns about the Pritikin Eating Plan and its benefit of reducing the risk of disease. The Pritikin Eating Plan does not focus on calories. Instead, it emphasizes high-quality, nutrient-rich foods. By knowing the characteristics of the foods, we choose, we can determine their calorie density and make informed decisions.  Targeting Your Nutrition Priorities  Clinical staff conducted group or individual video education with verbal and written material and guidebook.  Patient learns that lifestyle habits have a tremendous impact on disease risk and progression. This video provides eating and physical activity recommendations based on your personal health goals, such as reducing LDL cholesterol, losing weight, preventing or controlling type 2 diabetes, and reducing high blood pressure.  Vitamins and Minerals  Clinical staff conducted group or individual video education with verbal and written material and guidebook.  Patient learns different ways to obtain key vitamins and minerals, including through a recommended healthy diet. It is important to discuss all supplements you take with your doctor.   Healthy Mind-Set    Smoking Cessation  Clinical staff conducted group or individual video education with verbal and written material and guidebook.  Patient learns that cigarette smoking and tobacco addiction pose a serious health risk which affects millions of people. Stopping smoking will significantly reduce the risk of heart disease, lung disease, and many forms of cancer.  Recommended strategies for quitting are covered, including working with your doctor to develop a successful plan.  Culinary   Becoming a Set designer conducted group or individual video education with verbal and written material and guidebook.  Patient learns that cooking at home can be healthy, cost-effective, quick, and puts them in control. Keys to cooking healthy recipes will include looking at your recipe, assessing your equipment needs, planning ahead, making it simple, choosing cost-effective seasonal ingredients, and limiting the use of added fats, salts, and sugars.  Cooking - Breakfast and Snacks  Clinical staff conducted group or individual video education with verbal and written material and guidebook.  Patient learns how important breakfast is to satiety and nutrition through the entire day. Recommendations include key foods to eat during breakfast to help stabilize blood sugar levels and to prevent overeating at meals later in the day. Planning ahead is also a key component.  Cooking - Educational psychologist conducted group or individual video education with verbal and written material and  guidebook.  Patient learns eating strategies to improve overall health, including an approach to cook more at home. Recommendations include thinking of animal protein as a side on your plate rather than center stage and focusing instead on lower calorie dense options like vegetables, fruits, whole grains, and plant-based proteins, such as beans. Making sauces in large quantities to freeze for later and leaving the skin on your vegetables are also recommended to maximize your experience.  Cooking - Healthy Salads and Dressing Clinical staff conducted group or individual video education with verbal and written material and guidebook.  Patient learns that vegetables, fruits, whole grains, and legumes are the foundations of the Pritikin Eating Plan. Recommendations include how  to incorporate each of these in flavorful and healthy salads, and how to create homemade salad dressings. Proper handling of ingredients is also covered. Cooking - Soups and [employer] - Soups and Desserts Clinical staff conducted group or individual video education with verbal and written material and guidebook.  Patient learns that Pritikin soups and desserts make for easy, nutritious, and delicious snacks and meal components that are low in sodium, fat, sugar, and calorie density, while high in vitamins, minerals, and filling fiber. Recommendations include simple and healthy ideas for soups and desserts.   Overview     The Pritikin Solution Program Overview Clinical staff conducted group or individual video education with verbal and written material and guidebook.  Patient learns that the results of the Pritikin Program have been documented in more than 100 articles published in peer-reviewed journals, and the benefits include reducing risk factors for (and, in some cases, even reversing) high cholesterol, high blood pressure, type 2 diabetes, obesity, and more! An overview of the three key pillars of the Pritikin Program will be covered: eating well, doing regular exercise, and having a healthy mind-set.  WORKSHOPS  Exercise: Exercise Basics: Building Your Action Plan Clinical staff led group instruction and group discussion with PowerPoint presentation and patient guidebook. To enhance the learning environment the use of posters, models and videos may be added. At the conclusion of this workshop, patients will comprehend the difference between physical activity and exercise, as well as the benefits of incorporating both, into their routine. Patients will understand the FITT (Frequency, Intensity, Time, and Type) principle and how to use it to build an exercise action plan. In addition, safety concerns and other considerations for exercise and cardiac rehab will be addressed by the  presenter. The purpose of this lesson is to promote a comprehensive and effective weekly exercise routine in order to improve patients' overall level of fitness.   Managing Heart Disease: Your Path to a Healthier Heart Clinical staff led group instruction and group discussion with PowerPoint presentation and patient guidebook. To enhance the learning environment the use of posters, models and videos may be added.At the conclusion of this workshop, patients will understand the anatomy and physiology of the heart. Additionally, they will understand how Pritikin's three pillars impact the risk factors, the progression, and the management of heart disease.  The purpose of this lesson is to provide a high-level overview of the heart, heart disease, and how the Pritikin lifestyle positively impacts risk factors.  Exercise Biomechanics Clinical staff led group instruction and group discussion with PowerPoint presentation and patient guidebook. To enhance the learning environment the use of posters, models and videos may be added. Patients will learn how the structural parts of their bodies function and how these functions impact their daily activities, movement, and exercise.  Patients will learn how to promote a neutral spine, learn how to manage pain, and identify ways to improve their physical movement in order to promote healthy living. The purpose of this lesson is to expose patients to common physical limitations that impact physical activity. Participants will learn practical ways to adapt and manage aches and pains, and to minimize their effect on regular exercise. Patients will learn how to maintain good posture while sitting, walking, and lifting.  Balance Training and Fall Prevention  Clinical staff led group instruction and group discussion with PowerPoint presentation and patient guidebook. To enhance the learning environment the use of posters, models and videos may be added. At the  conclusion of this workshop, patients will understand the importance of their sensorimotor skills (vision, proprioception, and the vestibular system) in maintaining their ability to balance as they age. Patients will apply a variety of balancing exercises that are appropriate for their current level of function. Patients will understand the common causes for poor balance, possible solutions to these problems, and ways to modify their physical environment in order to minimize their fall risk. The purpose of this lesson is to teach patients about the importance of maintaining balance as they age and ways to minimize their risk of falling.  WORKSHOPS   Nutrition:  Fueling a Ship broker led group instruction and group discussion with PowerPoint presentation and patient guidebook. To enhance the learning environment the use of posters, models and videos may be added. Patients will review the foundational principles of the Pritikin Eating Plan and understand what constitutes a serving size in each of the food groups. Patients will also learn Pritikin-friendly foods that are better choices when away from home and review make-ahead meal and snack options. Calorie density will be reviewed and applied to three nutrition priorities: weight maintenance, weight loss, and weight gain. The purpose of this lesson is to reinforce (in a group setting) the key concepts around what patients are recommended to eat and how to apply these guidelines when away from home by planning and selecting Pritikin-friendly options. Patients will understand how calorie density may be adjusted for different weight management goals.  Mindful Eating  Clinical staff led group instruction and group discussion with PowerPoint presentation and patient guidebook. To enhance the learning environment the use of posters, models and videos may be added. Patients will briefly review the concepts of the Pritikin Eating Plan and the  importance of low-calorie dense foods. The concept of mindful eating will be introduced as well as the importance of paying attention to internal hunger signals. Triggers for non-hunger eating and techniques for dealing with triggers will be explored. The purpose of this lesson is to provide patients with the opportunity to review the basic principles of the Pritikin Eating Plan, discuss the value of eating mindfully and how to measure internal cues of hunger and fullness using the Hunger Scale. Patients will also discuss reasons for non-hunger eating and learn strategies to use for controlling emotional eating.  Targeting Your Nutrition Priorities Clinical staff led group instruction and group discussion with PowerPoint presentation and patient guidebook. To enhance the learning environment the use of posters, models and videos may be added. Patients will learn how to determine their genetic susceptibility to disease by reviewing their family history. Patients will gain insight into the importance of diet as part of an overall healthy lifestyle in mitigating the impact of genetics and other environmental insults. The purpose of this lesson is to provide patients with  the opportunity to assess their personal nutrition priorities by looking at their family history, their own health history and current risk factors. Patients will also be able to discuss ways of prioritizing and modifying the Pritikin Eating Plan for their highest risk areas  Menu  Clinical staff led group instruction and group discussion with PowerPoint presentation and patient guidebook. To enhance the learning environment the use of posters, models and videos may be added. Using menus brought in from [employer], or printed from [employer], patients will apply the Pritikin dining out guidelines that were presented in the [employer] video. Patients will also be able to practice these guidelines in a variety of  provided scenarios. The purpose of this lesson is to provide patients with the opportunity to practice hands-on learning of the Pritikin Dining Out guidelines with actual menus and practice scenarios.  Label Reading Clinical staff led group instruction and group discussion with PowerPoint presentation and patient guidebook. To enhance the learning environment the use of posters, models and videos may be added. Patients will review and discuss the Pritikin label reading guidelines presented in Pritikin's Label Reading Educational series video. Using fool labels brought in from local grocery stores and markets, patients will apply the label reading guidelines and determine if the packaged food meet the Pritikin guidelines. The purpose of this lesson is to provide patients with the opportunity to review, discuss, and practice hands-on learning of the Pritikin Label Reading guidelines with actual packaged food labels. Cooking School  Pritikin's [employer] are designed to teach patients ways to prepare quick, simple, and affordable recipes at home. The importance of nutrition's role in chronic disease risk reduction is reflected in its emphasis in the overall Pritikin program. By learning how to prepare essential core Pritikin Eating Plan recipes, patients will increase control over what they eat; be able to customize the flavor of foods without the use of added salt, sugar, or fat; and improve the quality of the food they consume. By learning a set of core recipes which are easily assembled, quickly prepared, and affordable, patients are more likely to prepare more healthy foods at home. These workshops focus on convenient breakfasts, simple entres, side dishes, and desserts which can be prepared with minimal effort and are consistent with nutrition recommendations for cardiovascular risk reduction. Cooking [employer] are taught by a Armed forces logistics/support/administrative officer (RD) who has been trained by the  [employer]. The chef or RD has a clear understanding of the importance of minimizing - if not completely eliminating - added fat, sugar, and sodium in recipes. Throughout the series of Cooking School Workshop sessions, patients will learn about healthy ingredients and efficient methods of cooking to build confidence in their capability to prepare    Cooking School weekly topics:  Adding Flavor- Sodium-Free  Fast and Healthy Breakfasts  Powerhouse Plant-Based Proteins  Satisfying Salads and Dressings  Simple Sides and Sauces  International Cuisine-Spotlight on the [employer] Zones  Delicious Desserts  Savory Soups  [employer] - Meals in a Astronomer Appetizers and Snacks  Comforting Weekend Breakfasts  One-Pot Wonders   Fast Evening Meals  Landscape architect Your Pritikin Plate  WORKSHOPS   Healthy Mindset (Psychosocial):  Focused Goals, Sustainable Changes Clinical staff led group instruction and group discussion with PowerPoint presentation and patient guidebook. To enhance the learning environment the use of posters, models and videos may be added. Patients will be able to apply effective goal  setting strategies to establish at least one personal goal, and then take consistent, meaningful action toward that goal. They will learn to identify common barriers to achieving personal goals and develop strategies to overcome them. Patients will also gain an understanding of how our mind-set can impact our ability to achieve goals and the importance of cultivating a positive and growth-oriented mind-set. The purpose of this lesson is to provide patients with a deeper understanding of how to set and achieve personal goals, as well as the tools and strategies needed to overcome common obstacles which may arise along the way.  From Head to Heart: The Power of a Healthy Outlook  Clinical staff led group instruction and group discussion with PowerPoint presentation  and patient guidebook. To enhance the learning environment the use of posters, models and videos may be added. Patients will be able to recognize and describe the impact of emotions and mood on physical health. They will discover the importance of self-care and explore self-care practices which may work for them. Patients will also learn how to utilize the 4 C's to cultivate a healthier outlook and better manage stress and challenges. The purpose of this lesson is to demonstrate to patients how a healthy outlook is an essential part of maintaining good health, especially as they continue their cardiac rehab journey.  Healthy Sleep for a Healthy Heart Clinical staff led group instruction and group discussion with PowerPoint presentation and patient guidebook. To enhance the learning environment the use of posters, models and videos may be added. At the conclusion of this workshop, patients will be able to demonstrate knowledge of the importance of sleep to overall health, well-being, and quality of life. They will understand the symptoms of, and treatments for, common sleep disorders. Patients will also be able to identify daytime and nighttime behaviors which impact sleep, and they will be able to apply these tools to help manage sleep-related challenges. The purpose of this lesson is to provide patients with a general overview of sleep and outline the importance of quality sleep. Patients will learn about a few of the most common sleep disorders. Patients will also be introduced to the concept of "sleep hygiene," and discover ways to self-manage certain sleeping problems through simple daily behavior changes. Finally, the workshop will motivate patients by clarifying the links between quality sleep and their goals of heart-healthy living.   Recognizing and Reducing Stress Clinical staff led group instruction and group discussion with PowerPoint presentation and patient guidebook. To enhance the learning  environment the use of posters, models and videos may be added. At the conclusion of this workshop, patients will be able to understand the types of stress reactions, differentiate between acute and chronic stress, and recognize the impact that chronic stress has on their health. They will also be able to apply different coping mechanisms, such as reframing negative self-talk. Patients will have the opportunity to practice a variety of stress management techniques, such as deep abdominal breathing, progressive muscle relaxation, and/or guided imagery.  The purpose of this lesson is to educate patients on the role of stress in their lives and to provide healthy techniques for coping with it.  Learning Barriers/Preferences:  Learning Barriers/Preferences - 11/21/23 1425       Learning Barriers/Preferences   Learning Barriers Sight   readers   Learning Preferences Audio;Computer/Internet;Group Instruction;Individual Instruction;Skilled Demonstration;Pictoral;Verbal Instruction;Written Material             Education Topics:  Knowledge Questionnaire Score:  Knowledge Questionnaire Score -  11/21/23 1430       Knowledge Questionnaire Score   Pre Score 21/24             Core Components/Risk Factors/Patient Goals at Admission:  Personal Goals and Risk Factors at Admission - 11/21/23 1426       Core Components/Risk Factors/Patient Goals on Admission    Weight Management Yes;Weight Maintenance    Intervention Weight Management: Develop a combined nutrition and exercise program designed to reach desired caloric intake, while maintaining appropriate intake of nutrient and fiber, sodium and fats, and appropriate energy expenditure required for the weight goal.;Weight Management: Provide education and appropriate resources to help participant work on and attain dietary goals.    Expected Outcomes Short Term: Continue to assess and modify interventions until short term weight is achieved;Long  Term: Adherence to nutrition and physical activity/exercise program aimed toward attainment of established weight goal;Weight Maintenance: Understanding of the daily nutrition guidelines, which includes 25-35% calories from fat, 7% or less cal from saturated fats, less than 200mg  cholesterol, less than 1.5gm of sodium, & 5 or more servings of fruits and vegetables daily;Understanding recommendations for meals to include 15-35% energy as protein, 25-35% energy from fat, 35-60% energy from carbohydrates, less than 200mg  of dietary cholesterol, 20-35 gm of total fiber daily;Understanding of distribution of calorie intake throughout the day with the consumption of 4-5 meals/snacks    Diabetes Yes    Intervention Provide education about proper nutrition, including hydration, and aerobic/resistive exercise prescription along with prescribed medications to achieve blood glucose in normal ranges: Fasting glucose 65-99 mg/dL;Provide education about signs/symptoms and action to take for hypo/hyperglycemia.    Expected Outcomes Short Term: Participant verbalizes understanding of the signs/symptoms and immediate care of hyper/hypoglycemia, proper foot care and importance of medication, aerobic/resistive exercise and nutrition plan for blood glucose control.;Long Term: Attainment of HbA1C < 7%.    Heart Failure --    Intervention --    Expected Outcomes --    Hypertension Yes    Intervention Provide education on lifestyle modifcations including regular physical activity/exercise, weight management, moderate sodium restriction and increased consumption of fresh fruit, vegetables, and low fat dairy, alcohol  moderation, and smoking cessation.;Monitor prescription use compliance.    Expected Outcomes Short Term: Continued assessment and intervention until BP is < 140/21mm HG in hypertensive participants. < 130/33mm HG in hypertensive participants with diabetes, heart failure or chronic kidney disease.;Long Term: Maintenance  of blood pressure at goal levels.    Lipids Yes    Intervention Provide education and support for participant on nutrition & aerobic/resistive exercise along with prescribed medications to achieve LDL 70mg , HDL >40mg .    Expected Outcomes Short Term: Participant states understanding of desired cholesterol values and is compliant with medications prescribed. Participant is following exercise prescription and nutrition guidelines.;Long Term: Cholesterol controlled with medications as prescribed, with individualized exercise RX and with personalized nutrition plan. Value goals: LDL < 70mg , HDL > 40 mg.             Core Components/Risk Factors/Patient Goals Review:   Goals and Risk Factor Review     Row Name 12/13/23 1331             Core Components/Risk Factors/Patient Goals Review   Personal Goals Review Weight Management/Obesity;Hypertension;Lipids;Diabetes       Review Jakeim has been doing well with exercise at cardiac rehab. vital signs and CBG's have been stable.       Expected Outcomes Deep will continue to participate in cardiac rehab  for exercise, nutrition and lifestyle modifications                Core Components/Risk Factors/Patient Goals at Discharge (Final Review):   Goals and Risk Factor Review - 12/13/23 1331       Core Components/Risk Factors/Patient Goals Review   Personal Goals Review Weight Management/Obesity;Hypertension;Lipids;Diabetes    Review Billey has been doing well with exercise at cardiac rehab. vital signs and CBG's have been stable.    Expected Outcomes Zackarey will continue to participate in cardiac rehab for exercise, nutrition and lifestyle modifications             ITP Comments:  ITP Comments     Row Name 11/21/23 1321 11/26/23 0928 12/13/23 1326       ITP Comments Dr. Gaylyn Keas medical director. Introduction to pritikin education/intensive cardiac rehab. Initial orientation packet reviewed with patient. 30 Day ITP Review. Cortavius  started cardiac rehab on 11/26/23. Ethanael is off to a good start to exercise. 30 Day ITP Review. Jacobus has good attendance and participaion with exercise at cardiac rehab              Comments: See ITP comments.

## 2023-12-18 DIAGNOSIS — D1801 Hemangioma of skin and subcutaneous tissue: Secondary | ICD-10-CM | POA: Diagnosis not present

## 2023-12-18 DIAGNOSIS — Z85828 Personal history of other malignant neoplasm of skin: Secondary | ICD-10-CM | POA: Diagnosis not present

## 2023-12-18 DIAGNOSIS — L570 Actinic keratosis: Secondary | ICD-10-CM | POA: Diagnosis not present

## 2023-12-18 DIAGNOSIS — L812 Freckles: Secondary | ICD-10-CM | POA: Diagnosis not present

## 2023-12-18 DIAGNOSIS — L821 Other seborrheic keratosis: Secondary | ICD-10-CM | POA: Diagnosis not present

## 2023-12-19 ENCOUNTER — Encounter (HOSPITAL_COMMUNITY)
Admission: RE | Admit: 2023-12-19 | Discharge: 2023-12-19 | Disposition: A | Discharge status: Home / Self Care | Source: Ambulatory Visit | Attending: Cardiology | Admitting: Cardiology

## 2023-12-19 DIAGNOSIS — Z952 Presence of prosthetic heart valve: Secondary | ICD-10-CM | POA: Diagnosis not present

## 2023-12-21 ENCOUNTER — Encounter (HOSPITAL_COMMUNITY)
Admission: RE | Admit: 2023-12-21 | Discharge: 2023-12-21 | Disposition: A | Discharge status: Home / Self Care | Source: Ambulatory Visit | Attending: Cardiology | Admitting: Cardiology

## 2023-12-21 DIAGNOSIS — Z952 Presence of prosthetic heart valve: Secondary | ICD-10-CM

## 2023-12-24 ENCOUNTER — Encounter (HOSPITAL_COMMUNITY)
Admission: RE | Admit: 2023-12-24 | Discharge: 2023-12-24 | Disposition: A | Discharge status: Home / Self Care | Source: Ambulatory Visit | Attending: Cardiology | Admitting: Cardiology

## 2023-12-24 VITALS — Ht 74.0 in | Wt 175.7 lb

## 2023-12-24 DIAGNOSIS — Z952 Presence of prosthetic heart valve: Secondary | ICD-10-CM

## 2023-12-26 ENCOUNTER — Encounter (HOSPITAL_COMMUNITY)
Admission: RE | Admit: 2023-12-26 | Discharge: 2023-12-26 | Disposition: A | Discharge status: Home / Self Care | Source: Ambulatory Visit | Attending: Cardiology

## 2023-12-26 DIAGNOSIS — Z952 Presence of prosthetic heart valve: Secondary | ICD-10-CM

## 2023-12-26 NOTE — Progress Notes (Signed)
 Reviewed home exercise Rx with patient today.  Encouraged warm-up, cool-down, and stretching. Reviewed THRR of 52-105 and keeping RPE between 11-13. Encouraged to hydrate with activity.  Reviewed weather parameters for temperature and humidity for safe exercise outdoors. Reviewed S/S to terminate exercise and when to call 911 vs MD.  Pt encouraged to always carry a cell phone for safety when exercising outdoors. Pt verbalized understanding of the home exercise Rx and was provided a copy.  Rosezena Contes MS, ACSM-CEP, CCRP

## 2023-12-28 ENCOUNTER — Encounter (HOSPITAL_COMMUNITY)
Admission: RE | Admit: 2023-12-28 | Discharge: 2023-12-28 | Disposition: A | Discharge status: Home / Self Care | Source: Ambulatory Visit | Attending: Cardiology | Admitting: Cardiology

## 2023-12-28 DIAGNOSIS — Z952 Presence of prosthetic heart valve: Secondary | ICD-10-CM | POA: Insufficient documentation

## 2023-12-28 NOTE — Progress Notes (Signed)
 Discharge Progress Report  Patient Details  Name: Henry Holland MRN: [id number] Date of Birth: Sep 15, 1932 Referring Provider:   Flowsheet Row INTENSIVE CARDIAC REHAB ORIENT from 11/21/2023 in [facility]  Referring Provider Sheryle Donning, MD        Number of Visits: 30  Reason for Discharge:  Patient reached a stable level of exercise. Patient independent in their exercise. Patient has met program and personal goals.  Smoking History:  Social History   Tobacco Use  Smoking Status Never  Smokeless Tobacco Never    Diagnosis:  10/23/23 TAVR  ADL UCSD:   Initial Exercise Prescription:  Initial Exercise Prescription - 11/21/23 1500       Date of Initial Exercise RX and Referring Provider   Date 11/21/23    Referring Provider Sheryle Donning, MD    Expected Discharge Date 02/13/24      Recumbant Bike   Level 1    RPM 40    Watts 12    Minutes 15    METs 2      NuStep   Level 1    SPM 75    Minutes 15    METs 2      Prescription Details   Frequency (times per week) 3    Duration Progress to 30 minutes of continuous aerobic without signs/symptoms of physical distress      Intensity   THRR 40-80% of Max Heartrate 52-104    Ratings of Perceived Exertion 11-13    Perceived Dyspnea 0-4      Progression   Progression Continue progressive overload as per policy without signs/symptoms or physical distress.      Resistance Training   Training Prescription Yes    Weight 2    Reps 10-15             Discharge Exercise Prescription (Final Exercise Prescription Changes):  Exercise Prescription Changes - 12/28/23 1040       Response to Exercise   Blood Pressure (Admit) 128/70    Blood Pressure (Exit) 118/58    Heart Rate (Admit) 78 bpm    Heart Rate (Exercise) 119 bpm    Heart Rate (Exit) 83 bpm    Rating of Perceived Exertion (Exercise) 11    Symptoms None    Comments Pt  graduated from the CRP2 program today    Duration Progress to 30 minutes of  aerobic without signs/symptoms of physical distress    Intensity THRR unchanged      Progression   Progression Continue to progress workloads to maintain intensity without signs/symptoms of physical distress.    Average METs 2.25      Resistance Training   Training Prescription Yes    Weight 3 lbs    Reps 10-15    Time 10 Minutes      Interval Training   Interval Training No      Recumbant Bike   Level 1    RPM 78    Watts 18    Minutes 15    METs 2      NuStep   Level 1    SPM 93    Minutes 15    METs 2.5      Home Exercise Plan   Plans to continue exercise at [employer] (comment)    Frequency Add 3 additional days to program exercise sessions.    Initial Home Exercises Provided 12/26/23  Functional Capacity:  6 Minute Walk     Row Name 11/21/23 1551 12/24/23 0917       6 Minute Walk   Phase Initial Discharge    Distance 1140 feet 1405 feet    Distance % Change -- 23.25 %    Distance Feet Change -- 265 ft    Walk Time 6 minutes 6 minutes    # of Rest Breaks 1  3:00-3:30 Over THR 0    MPH 2.16 2.7    METS 2.17 2.8    RPE 13 11    Perceived Dyspnea  0 0    VO2 Peak 7.59 9.72    Symptoms Yes (comment) No    Comments break from 3:00-3:30 due to over THR. 5/10 R leg chronic pain, resolved with rest 3/10 back pain, resolved with rest    Resting HR 65 bpm 78 bpm    Resting BP 122/70 130/70    Resting Oxygen Saturation  97 % --    Exercise Oxygen Saturation  during 6 min walk 97 % --    Max Ex. HR 113 bpm 120 bpm    Max Ex. BP 132/66 140/78    2 Minute Post BP 110/64 140/64             Psychological, QOL, Others - Outcomes: PHQ 2/9:    12/28/2023    9:13 AM 11/21/2023    2:22 PM  Depression screen PHQ 2/9  Decreased Interest 0 1  Down, Depressed, Hopeless 0 0  PHQ - 2 Score 0 1  Altered sleeping 0 1  Tired, decreased energy 1 3  Change in  appetite 1 1  Feeling bad or failure about yourself  1 0  Trouble concentrating 0 0  Moving slowly or fidgety/restless 0 0  Suicidal thoughts 0 0  PHQ-9 Score 3 6  Difficult doing work/chores Somewhat difficult Not difficult at all    Quality of Life:  Quality of Life - 12/26/23 1716       Quality of Life Scores   Health/Function Pre 22.04 %    Health/Function Post 22.7 %    Health/Function % Change 2.99 %    Socioeconomic Pre 27.14 %    Socioeconomic Post 26.08 %    Socioeconomic % Change  -3.91 %    Psych/Spiritual Pre 27.43 %    Psych/Spiritual Post 27.07 %    Psych/Spiritual % Change -1.31 %    Family Pre 28.8 %    Family Post 28.8 %    Family % Change 0 %    GLOBAL Pre 25.39 %    GLOBAL Post 25.17 %    GLOBAL % Change -0.87 %             Personal Goals: Goals established at orientation with interventions provided to work toward goal.  Personal Goals and Risk Factors at Admission - 11/21/23 1426       Core Components/Risk Factors/Patient Goals on Admission    Weight Management Yes;Weight Maintenance    Intervention Weight Management: Develop a combined nutrition and exercise program designed to reach desired caloric intake, while maintaining appropriate intake of nutrient and fiber, sodium and fats, and appropriate energy expenditure required for the weight goal.;Weight Management: Provide education and appropriate resources to help participant work on and attain dietary goals.    Expected Outcomes Short Term: Continue to assess and modify interventions until short term weight is achieved;Long Term: Adherence to nutrition and physical activity/exercise program aimed toward attainment of  established weight goal;Weight Maintenance: Understanding of the daily nutrition guidelines, which includes 25-35% calories from fat, 7% or less cal from saturated fats, less than 200mg  cholesterol, less than 1.5gm of sodium, & 5 or more servings of fruits and vegetables  daily;Understanding recommendations for meals to include 15-35% energy as protein, 25-35% energy from fat, 35-60% energy from carbohydrates, less than 200mg  of dietary cholesterol, 20-35 gm of total fiber daily;Understanding of distribution of calorie intake throughout the day with the consumption of 4-5 meals/snacks    Diabetes Yes    Intervention Provide education about proper nutrition, including hydration, and aerobic/resistive exercise prescription along with prescribed medications to achieve blood glucose in normal ranges: Fasting glucose 65-99 mg/dL;Provide education about signs/symptoms and action to take for hypo/hyperglycemia.    Expected Outcomes Short Term: Participant verbalizes understanding of the signs/symptoms and immediate care of hyper/hypoglycemia, proper foot care and importance of medication, aerobic/resistive exercise and nutrition plan for blood glucose control.;Long Term: Attainment of HbA1C < 7%.    Heart Failure --    Intervention --    Expected Outcomes --    Hypertension Yes    Intervention Provide education on lifestyle modifcations including regular physical activity/exercise, weight management, moderate sodium restriction and increased consumption of fresh fruit, vegetables, and low fat dairy, alcohol  moderation, and smoking cessation.;Monitor prescription use compliance.    Expected Outcomes Short Term: Continued assessment and intervention until BP is < 140/74mm HG in hypertensive participants. < 130/26mm HG in hypertensive participants with diabetes, heart failure or chronic kidney disease.;Long Term: Maintenance of blood pressure at goal levels.    Lipids Yes    Intervention Provide education and support for participant on nutrition & aerobic/resistive exercise along with prescribed medications to achieve LDL 70mg , HDL >40mg .    Expected Outcomes Short Term: Participant states understanding of desired cholesterol values and is compliant with medications prescribed.  Participant is following exercise prescription and nutrition guidelines.;Long Term: Cholesterol controlled with medications as prescribed, with individualized exercise RX and with personalized nutrition plan. Value goals: LDL < 70mg , HDL > 40 mg.              Personal Goals Discharge:  Goals and Risk Factor Review     Row Name 12/13/23 1331             Core Components/Risk Factors/Patient Goals Review   Personal Goals Review Weight Management/Obesity;Hypertension;Lipids;Diabetes       Review Henry Holland has been doing well with exercise at cardiac rehab. vital signs and CBG's have been stable.       Expected Outcomes Henry Holland will continue to participate in cardiac rehab for exercise, nutrition and lifestyle modifications                Exercise Goals and Review:  Exercise Goals     Row Name 11/21/23 1322             Exercise Goals   Increase Physical Activity Yes       Intervention Develop an individualized exercise prescription for aerobic and resistive training based on initial evaluation findings, risk stratification, comorbidities and participant's personal goals.;Provide advice, education, support and counseling about physical activity/exercise needs.       Expected Outcomes Short Term: Attend rehab on a regular basis to increase amount of physical activity.;Long Term: Exercising regularly at least 3-5 days a week.;Long Term: Add in home exercise to make exercise part of routine and to increase amount of physical activity.       Increase Strength  and Stamina Yes       Intervention Develop an individualized exercise prescription for aerobic and resistive training based on initial evaluation findings, risk stratification, comorbidities and participant's personal goals.;Provide advice, education, support and counseling about physical activity/exercise needs.       Expected Outcomes Short Term: Increase workloads from initial exercise prescription for resistance, speed, and  METs.;Short Term: Perform resistance training exercises routinely during rehab and add in resistance training at home;Long Term: Improve cardiorespiratory fitness, muscular endurance and strength as measured by increased METs and functional capacity ( )       Able to understand and use rate of perceived exertion (RPE) scale Yes       Intervention Provide education and explanation on how to use RPE scale       Expected Outcomes Short Term: Able to use RPE daily in rehab to express subjective intensity level;Long Term:  Able to use RPE to guide intensity level when exercising independently       Knowledge and understanding of Target Heart Rate Range (THRR) Yes       Intervention Provide education and explanation of THRR including how the numbers were predicted and where they are located for reference       Expected Outcomes Short Term: Able to state/look up THRR;Short Term: Able to use daily as guideline for intensity in rehab;Long Term: Able to use THRR to govern intensity when exercising independently       Understanding of Exercise Prescription Yes       Intervention Provide education, explanation, and written materials on patient's individual exercise prescription       Expected Outcomes Short Term: Able to explain program exercise prescription;Long Term: Able to explain home exercise prescription to exercise independently                Exercise Goals Re-Evaluation:  Exercise Goals Re-Evaluation     Row Name 11/26/23 1208 12/28/23 1040           Exercise Goal Re-Evaluation   Exercise Goals Review Increase Physical Activity;Understanding of Exercise Prescription;Increase Strength and Stamina;Knowledge and understanding of Target Heart Rate Range (THRR);Able to understand and use rate of perceived exertion (RPE) scale Increase Physical Activity;Understanding of Exercise Prescription;Increase Strength and Stamina;Knowledge and understanding of Target Heart Rate Range (THRR);Able to  understand and use rate of perceived exertion (RPE) scale      Comments Pt's first day in the CRP2 program. Pt understands the exercise Rx, RPE sclae and THRR. Pt graduated from the CRP2 program today. Pt voices progress on his goals of increased strength and stamina. Pt plans to continue his exercise at [employer], using the bike, nustep and walking.      Expected Outcomes Will continue to monitor patient and progress exercise workloads as tolerated. Pt will continue to exercise on his own at the gym.               Nutrition & Weight - Outcomes:   Post Biometrics - 12/24/23 0928        Post  Biometrics   Height 6\' 2"  (1.88 m)    Weight 79.7 kg    Waist Circumference 42 inches    Hip Circumference 41 inches    Waist to Hip Ratio 1.02 %    BMI (Calculated) 22.55    Triceps Skinfold 10 mm    % Body Fat 28.9 %    Grip Strength 20 kg    Flexibility --   not performed  Nutrition:  Nutrition Therapy & Goals - 12/28/23 0940       Nutrition Therapy   Diet Heart Healthy Diet    Drug/Food Interactions Statins/Certain Fruits;Coumadin/Vit K      Personal Nutrition Goals   Nutrition Goal Patient to identify strategies for reducing cardiovascular risk by attending the [facility] education and nutrition series weekly.   goal in progress   Personal Goal #2 Patient to improve diet quality by using the plate method as a guide for meal planning to include lean protein/plant protein, fruits, vegetables, whole grains, nonfat dairy as part of a well-balanced diet.   goal in progress.   Comments Goals in progress. Henry Holland has medical history of HTN, afib, hyperlipidemia, s/pTAVR, DM2. He continues to attend the [facility] education/nutrition series regularly.  A1c is well controlled in a pre-diabetic range.He has maintained his weight since starting with our program. Patient will benefit from participation in intensive cardiac rehab for nutrition, exercise, and lifestyle modification.       Intervention Plan   Intervention Prescribe, educate and counsel regarding individualized specific dietary modifications aiming towards targeted core components such as weight, hypertension, lipid management, diabetes, heart failure and other comorbidities.;Nutrition handout(s) given to patient.    Expected Outcomes Short Term Goal: Understand basic principles of dietary content, such as calories, fat, sodium, cholesterol and nutrients.;Long Term Goal: Adherence to prescribed nutrition plan.             Nutrition Discharge:  Nutrition Assessments - 12/18/23 1529       Rate Your Plate Scores   Pre Score 71             Education Questionnaire Score:  Knowledge Questionnaire Score - 12/26/23 1716       Knowledge Questionnaire Score   Post Score 21/24             Goals reviewed with patient; copy given to patient.Pt graduated early  from  Intensive/Traditional cardiac rehab program on 12/28/23 with completion of  15  exercise and  15 education sessions. Pt maintained good attendance and progressed nicely during their participation in rehab as evidenced by increased MET level. Henry Holland increased his distance on his post exercise walk test by 265 feet  Medication list reconciled. Repeat  PHQ score- 3 .  Pt has made significant lifestyle changes and should be commended for his success.  Henry Holland  achieved their goals during cardiac rehab.   Pt plans to continue exercise at [employer].Monte Antonio RN BSN

## 2023-12-31 ENCOUNTER — Encounter (HOSPITAL_COMMUNITY)

## 2024-01-02 ENCOUNTER — Encounter (HOSPITAL_COMMUNITY)

## 2024-01-04 ENCOUNTER — Encounter (HOSPITAL_COMMUNITY): Admission: RE | Admit: 2024-01-04 | Source: Ambulatory Visit

## 2024-01-07 ENCOUNTER — Encounter (HOSPITAL_COMMUNITY)

## 2024-01-09 ENCOUNTER — Encounter (HOSPITAL_COMMUNITY): Admission: RE | Admit: 2024-01-09 | Source: Ambulatory Visit

## 2024-01-09 ENCOUNTER — Telehealth: Payer: Self-pay | Admitting: Cardiology

## 2024-01-09 NOTE — Telephone Encounter (Signed)
 Patient calling about having a echo done in 2025. Please advise

## 2024-01-10 DIAGNOSIS — Z7901 Long term (current) use of anticoagulants: Secondary | ICD-10-CM | POA: Diagnosis not present

## 2024-01-10 DIAGNOSIS — I480 Paroxysmal atrial fibrillation: Secondary | ICD-10-CM | POA: Diagnosis not present

## 2024-01-10 DIAGNOSIS — D6869 Other thrombophilia: Secondary | ICD-10-CM | POA: Diagnosis not present

## 2024-01-10 NOTE — Telephone Encounter (Signed)
 Returned call to pt, he recently had echo done 11/2023- no repeat indicated at this time. Of note pt is scheduled for 1 year repeat echo 10/2024. Pt states he thought Dr. Veryl Gottron wanted him to have a repeat done prior to his followup appointment in August. Advised will ask about potential retest and call pt back.

## 2024-01-11 ENCOUNTER — Encounter (HOSPITAL_COMMUNITY)

## 2024-01-11 NOTE — Telephone Encounter (Signed)
 Returned call to pt, no answer, left detailed message with Dr. Veryl Gottron response.    "Now that he has had the TAVR, we are ok to follow the recommendations of the valve clinic, which is for repeat echo next spring. So he does not need one before our follow up visit. Thanks."

## 2024-01-14 ENCOUNTER — Encounter (HOSPITAL_COMMUNITY)

## 2024-01-16 ENCOUNTER — Encounter (HOSPITAL_COMMUNITY)

## 2024-01-18 ENCOUNTER — Encounter (HOSPITAL_COMMUNITY)

## 2024-01-23 ENCOUNTER — Encounter (HOSPITAL_COMMUNITY)

## 2024-01-25 ENCOUNTER — Encounter (HOSPITAL_COMMUNITY)

## 2024-01-28 ENCOUNTER — Encounter (HOSPITAL_COMMUNITY)

## 2024-01-30 ENCOUNTER — Encounter (HOSPITAL_COMMUNITY)

## 2024-02-01 ENCOUNTER — Encounter (HOSPITAL_COMMUNITY)

## 2024-02-04 ENCOUNTER — Encounter (HOSPITAL_COMMUNITY)

## 2024-02-06 ENCOUNTER — Encounter (HOSPITAL_COMMUNITY)

## 2024-02-08 ENCOUNTER — Encounter (HOSPITAL_COMMUNITY)

## 2024-02-11 ENCOUNTER — Encounter (HOSPITAL_COMMUNITY)

## 2024-02-12 DIAGNOSIS — Z7901 Long term (current) use of anticoagulants: Secondary | ICD-10-CM | POA: Diagnosis not present

## 2024-02-12 DIAGNOSIS — I480 Paroxysmal atrial fibrillation: Secondary | ICD-10-CM | POA: Diagnosis not present

## 2024-02-12 DIAGNOSIS — D6869 Other thrombophilia: Secondary | ICD-10-CM | POA: Diagnosis not present

## 2024-02-13 ENCOUNTER — Encounter (HOSPITAL_COMMUNITY)

## 2024-03-11 DIAGNOSIS — D696 Thrombocytopenia, unspecified: Secondary | ICD-10-CM | POA: Diagnosis not present

## 2024-03-11 DIAGNOSIS — E1129 Type 2 diabetes mellitus with other diabetic kidney complication: Secondary | ICD-10-CM | POA: Diagnosis not present

## 2024-03-11 DIAGNOSIS — I480 Paroxysmal atrial fibrillation: Secondary | ICD-10-CM | POA: Diagnosis not present

## 2024-03-11 DIAGNOSIS — D6869 Other thrombophilia: Secondary | ICD-10-CM | POA: Diagnosis not present

## 2024-03-11 DIAGNOSIS — Z7901 Long term (current) use of anticoagulants: Secondary | ICD-10-CM | POA: Diagnosis not present

## 2024-04-08 DIAGNOSIS — E291 Testicular hypofunction: Secondary | ICD-10-CM | POA: Diagnosis not present

## 2024-04-08 DIAGNOSIS — M810 Age-related osteoporosis without current pathological fracture: Secondary | ICD-10-CM | POA: Diagnosis not present

## 2024-04-08 DIAGNOSIS — E785 Hyperlipidemia, unspecified: Secondary | ICD-10-CM | POA: Diagnosis not present

## 2024-04-15 DIAGNOSIS — R82998 Other abnormal findings in urine: Secondary | ICD-10-CM | POA: Diagnosis not present

## 2024-04-15 DIAGNOSIS — Z1331 Encounter for screening for depression: Secondary | ICD-10-CM | POA: Diagnosis not present

## 2024-04-15 DIAGNOSIS — Z1339 Encounter for screening examination for other mental health and behavioral disorders: Secondary | ICD-10-CM | POA: Diagnosis not present

## 2024-04-15 DIAGNOSIS — I10 Essential (primary) hypertension: Secondary | ICD-10-CM | POA: Diagnosis not present

## 2024-04-15 DIAGNOSIS — Z0000 Encounter for general adult medical examination without abnormal findings: Secondary | ICD-10-CM | POA: Diagnosis not present

## 2024-04-15 DIAGNOSIS — Z8546 Personal history of malignant neoplasm of prostate: Secondary | ICD-10-CM | POA: Diagnosis not present

## 2024-04-16 ENCOUNTER — Telehealth (HOSPITAL_COMMUNITY): Payer: Self-pay

## 2024-04-16 NOTE — Telephone Encounter (Signed)
 Auth Submission: NO AUTH NEEDED Site of care: Site of care: [facility] Payer: [employer] Medicare Medication & CPT/J Code(s) submitted: Reclast  (Zolendronic acid) [id number] Diagnosis Code: M81.0 Route of submission (phone, fax, portal):  Phone # Fax # Auth type: Buy/Bill HB Units/visits requested: 5mg  x 1 dose Reference number:  Approval from: 04/16/24 to 08/27/24

## 2024-04-17 ENCOUNTER — Other Ambulatory Visit (HOSPITAL_COMMUNITY): Payer: Self-pay

## 2024-04-18 ENCOUNTER — Ambulatory Visit (HOSPITAL_COMMUNITY)
Admission: RE | Admit: 2024-04-18 | Discharge: 2024-04-18 | Disposition: A | Discharge status: Home / Self Care | Source: Ambulatory Visit | Attending: Internal Medicine | Admitting: Internal Medicine

## 2024-04-18 DIAGNOSIS — M810 Age-related osteoporosis without current pathological fracture: Secondary | ICD-10-CM | POA: Diagnosis not present

## 2024-04-18 MED ORDER — ZOLEDRONIC ACID 5 MG/100ML IV SOLN
INTRAVENOUS | Status: AC
  Filled 2024-04-18: qty 100

## 2024-04-18 MED ORDER — ZOLEDRONIC ACID 5 MG/100ML IV SOLN
5.0000 mg | Freq: Once | INTRAVENOUS | Status: AC
  Administered 2024-04-18: 5 mg via INTRAVENOUS

## 2024-04-23 DIAGNOSIS — M5416 Radiculopathy, lumbar region: Secondary | ICD-10-CM | POA: Diagnosis not present

## 2024-04-23 DIAGNOSIS — M6281 Muscle weakness (generalized): Secondary | ICD-10-CM | POA: Diagnosis not present

## 2024-04-25 ENCOUNTER — Encounter (HOSPITAL_BASED_OUTPATIENT_CLINIC_OR_DEPARTMENT_OTHER): Payer: Self-pay | Admitting: Cardiology

## 2024-04-25 ENCOUNTER — Ambulatory Visit (HOSPITAL_BASED_OUTPATIENT_CLINIC_OR_DEPARTMENT_OTHER): Admitting: Cardiology

## 2024-04-25 VITALS — BP 130/68 | HR 83 | Resp 17 | Ht 73.0 in | Wt 182.0 lb

## 2024-04-25 DIAGNOSIS — I5032 Chronic diastolic (congestive) heart failure: Secondary | ICD-10-CM | POA: Diagnosis not present

## 2024-04-25 DIAGNOSIS — I4821 Permanent atrial fibrillation: Secondary | ICD-10-CM

## 2024-04-25 DIAGNOSIS — R600 Localized edema: Secondary | ICD-10-CM

## 2024-04-25 DIAGNOSIS — I272 Pulmonary hypertension, unspecified: Secondary | ICD-10-CM

## 2024-04-25 DIAGNOSIS — Z952 Presence of prosthetic heart valve: Secondary | ICD-10-CM

## 2024-04-25 MED ORDER — FUROSEMIDE 40 MG PO TABS: 40.0000 mg | ORAL_TABLET | Freq: Every day | ORAL | 3 refills | Status: DC

## 2024-04-25 NOTE — Patient Instructions (Signed)
 Medication Instructions:   START TAKING FUROSEMIDE  (LASIX ) 40 MG BY MOUTH DAILY  *If you need a refill on your cardiac medications before your next appointment, please call your pharmacy*   Lab Work:  ONE WEEK HERE AT OUR [facility] ON THE 3RD FLOOR [address]--BMET  If you have labs (blood work) drawn today and your tests are completely normal, you will receive your results only by: MyChart Message (if you have MyChart) OR A paper copy in the mail If you have any lab test that is abnormal or we need to change your treatment, we will call you to review the results.    Follow-Up:  IN 2 WEEKS WITH CAITLIN WALKER, NP OR MICHELLE SWINYER, NP--OK TO USE TOC SLOT PER CAITLIN

## 2024-04-25 NOTE — Progress Notes (Signed)
 Cardiology Office Note:  .   Date:  04/25/2024  ID:  Henry Holland, DOB 1933/05/09, MRN [id number] PCP: [name] [name] [name], MD  [facility] Providers Cardiologist:  Shelda Bruckner, MD {  History of Present Illness: .   Henry Holland is a 89 y.o. male with a hx of permanent atrial fibrillation on coumadin, hypertension, aortic stenosis s/p TAVR who is seen for follow up today.    CV history: Atrial fibrillation: permanent, since 2002. Tolerating coumadin well, no significant bruising or bleeding. Aortic stenosis, most recent echo 07/17/23 with severe low flow low gradient AS with mean gradient 25, AVA 0.7, DI 0.24. LV SV 48. Mild-moderate MR. Severe biatrial enlargement.  Today: Noted shortness of breath and leg swelling starting about a few weeks ago. His last TAVR echo in 11/2023 was unchanged. He did have severely elevated PA pressure at 65 mmHg and RAP was 15 mmHg. RHC in January showed mildly elevate PA pressure pre-TAVR, could not get wedge pressure. RA mean 7, PA 53/18 (30).  If he climbs stairs, feels very short of breath. Also notes more shortness of breath bending over to wind up a hose. Felt better after resting 3-5 minutes. Has not had any issues at rest.   LE edema has been constant since his TAVR.  Weight is up a few lbs since April on office scale, but reports home weights have been in the same range recently.   We reviewed his prior elevated pulmonary pressures, both pre-TAVR but also post (by echo). Discussed that this can lead to swelling.   Labs from Dr. Loreli from 04/08/24 reviewed. Lipids Tchol 101, TG 85, HDL 41, LDL 43. BMET Cr 0.7, Na 129 (trending down 133->131->129), K 4.3, CL 97, Co2 25. Albumin 4.2.   ROS: Denies chest pain, shortness of breath at rest. No PND, orthopnea. No syncope or palpitations. ROS otherwise negative except as noted.   Studies Reviewed: SABRA    EKG:       Physical Exam:   VS:  BP 130/68 (BP Location: Left Arm, Patient Position:  Sitting, Cuff Size: Normal)   Pulse 83   Resp 17   Ht 6' 1 (1.854 m)   Wt 182 lb (82.6 kg)   SpO2 95%   BMI 24.01 kg/m    Wt Readings from Last 3 Encounters:  04/25/24 182 lb (82.6 kg)  12/24/23 175 lb 11.3 oz (79.7 kg)  11/30/23 177 lb (80.3 kg)    GEN: Well nourished, well developed in no acute distress HEENT: Normal, moist mucous membranes NECK: JVD to jaw sitting upright CARDIAC: irregular rhythm, normal S1 and S2, no rubs or gallops. 1/6 systolic murmur VASCULAR: Radial and DP pulses 2+ bilaterally. No carotid bruits RESPIRATORY:  Clear to auscultation without rales, wheezing or rhonchi  ABDOMEN: Soft, non-tender, non-distended MUSCULOSKELETAL:  Ambulates independently SKIN: Warm and dry, bilateral 1+ pitting edema NEUROLOGIC:  Alert and oriented x 3. No focal neuro deficits noted. PSYCHIATRIC:  Normal affect    ASSESSMENT AND PLAN: .    Severe aortic stenosis (low-flow low-gradient) s/p TAVR -echo reviewed today, see above   LE edema Pulmonary hypertension Chronic diastolic heart failure Moderate mitral regurgitation -reviewed with him today. Symptoms have been worsening, not improving, post TAVR. Reviewed echo, valve functioning normally, but severe pulmonary hypertension of unclear etiology -his lungs are clear today. Argues against left sided etiology of his elevated right sided pressures. JVD very distended, significant LE edema support pulmonary hypertension/right heart failure as  potential etiology. -Given age, suspect secondary pulmonary hypertension over PAH, though cannot fully exclude -will start with low dose lasix . Monitor sodium and potassium closely, recheck 1 week -follow up closely -if not feeling much improved at follow up, would repeat echo  Permanent atrial fibrillation: rate controlled on diltiazem , long term anticoagulation with coumadin -CHA2DS2/VAS Stroke Risk Points=4. We have discussed DOAC but reasonable to continue coumadin per patient  preference  CV risk counseling and prevention -recommend heart healthy/Mediterranean diet, with whole grains, fruits, vegetable, fish, lean meats, nuts, and olive oil. Limit salt. -recommend moderate walking, 3-5 times/week for 30-50 minutes each session. Aim for at least 150 minutes.week. Goal should be pace of 3 miles/hours, or walking 1.5 miles in 30 minutes -recommend avoidance of tobacco products. Avoid excess alcohol .  Dispo: 2-3 weeks with me or APP  Total time of encounter: I spent 41 minutes dedicated to the care of this patient on the date of this encounter to include pre-visit review of records, face-to-face time with the patient discussing conditions above, and clinical documentation with the electronic health record. We specifically spent time today discussing leg swelling/shortness of breath, possible etiologies, review of prior testing, options for management.   Signed, Shelda Bruckner, MD   Shelda Bruckner, MD, PhD, [facility]  [facility]  [place]  [facility] at [facility] at [facility] [address] [place], [place] [address] [phone]

## 2024-04-29 ENCOUNTER — Telehealth (HOSPITAL_BASED_OUTPATIENT_CLINIC_OR_DEPARTMENT_OTHER): Payer: Self-pay | Admitting: Cardiology

## 2024-04-29 ENCOUNTER — Encounter (HOSPITAL_BASED_OUTPATIENT_CLINIC_OR_DEPARTMENT_OTHER): Payer: Self-pay

## 2024-04-29 DIAGNOSIS — R600 Localized edema: Secondary | ICD-10-CM

## 2024-04-29 DIAGNOSIS — I272 Pulmonary hypertension, unspecified: Secondary | ICD-10-CM

## 2024-04-29 DIAGNOSIS — I5032 Chronic diastolic (congestive) heart failure: Secondary | ICD-10-CM

## 2024-04-29 DIAGNOSIS — M6281 Muscle weakness (generalized): Secondary | ICD-10-CM | POA: Diagnosis not present

## 2024-04-29 DIAGNOSIS — M5416 Radiculopathy, lumbar region: Secondary | ICD-10-CM | POA: Diagnosis not present

## 2024-04-29 MED ORDER — FUROSEMIDE 40 MG PO TABS: 20.0000 mg | ORAL_TABLET | Freq: Every day | ORAL | Status: DC

## 2024-04-29 NOTE — Telephone Encounter (Signed)
 Spoke with patient who stated his breathing and swelling improved  Increased urinating Took 1/2 tablet yesterday, still lost about 5 lbs   Discussed with Reche ORN NP and will have patient decrease to Lasix  40 mg 1/2 tablet daily get labs Thursday so will have  prior to weekend   Advised patient, verbalized understanding

## 2024-04-29 NOTE — Telephone Encounter (Signed)
 Pt c/o swelling/edema: STAT if pt has developed SOB within 24 hours  If swelling, where is the swelling located?   A little swollen in the left foot but patient's right foot is completely drained  How much weight have you gained and in what time span?   Lost 19 pounds total but has gained 2 pounds overnight  Have you gained 2 pounds in a day or 5 pounds in a week?   Yes  Do you have a log of your daily weights (if so, list)?   Yes  Are you currently taking a fluid pill?   Yes  Are you currently SOB?   No  Have you traveled recently in a car or plane for an extended period of time?  No  Patient stated he took furosemide  (LASIX ) 40 MG tablet on Friday, Saturday and Sunday and lost 14 pounds 1 oz.  Patient stated yesterday (Monday) he took 1/2 pill and he lost 5 pounds and this morning he weighted 164.2 pounds.  Patient noted when he started on the medication on Friday he weighted 176 pounds.  Patient wants advice on next steps.

## 2024-04-29 NOTE — Telephone Encounter (Signed)
 Left message to call back.

## 2024-04-29 NOTE — Telephone Encounter (Signed)
 Patient stated he took furosemide  (LASIX ) 40 MG tablet on Friday, Saturday and Sunday and lost 14 pounds 1 oz.  Patient stated yesterday (Monday) he took 1/2 pill and he lost 5 pounds and this morning he weighted 164.2 pounds.  Patient noted when he started on the medication on Friday he weighted 176 pounds.  Patient wants advice on next steps.   Above per phone message today

## 2024-05-01 DIAGNOSIS — R600 Localized edema: Secondary | ICD-10-CM | POA: Diagnosis not present

## 2024-05-01 DIAGNOSIS — I272 Pulmonary hypertension, unspecified: Secondary | ICD-10-CM | POA: Diagnosis not present

## 2024-05-01 DIAGNOSIS — I4821 Permanent atrial fibrillation: Secondary | ICD-10-CM | POA: Diagnosis not present

## 2024-05-01 DIAGNOSIS — Z952 Presence of prosthetic heart valve: Secondary | ICD-10-CM | POA: Diagnosis not present

## 2024-05-01 DIAGNOSIS — I5032 Chronic diastolic (congestive) heart failure: Secondary | ICD-10-CM | POA: Diagnosis not present

## 2024-05-02 ENCOUNTER — Ambulatory Visit (HOSPITAL_BASED_OUTPATIENT_CLINIC_OR_DEPARTMENT_OTHER): Payer: Self-pay | Admitting: Family

## 2024-05-02 LAB — BASIC METABOLIC PANEL WITH GFR
BUN/Creatinine Ratio: 19 (ref 10–24)
BUN: 19 mg/dL (ref 8–27)
CO2: 20 mmol/L (ref 20–29)
Calcium: 9.1 mg/dL (ref 8.6–10.2)
Chloride: 96 mmol/L (ref 96–106)
Creatinine, Ser: 1 mg/dL (ref 0.76–1.27)
Glucose: 179 mg/dL — ABNORMAL HIGH (ref 70–99)
Potassium: 4.7 mmol/L (ref 3.5–5.2)
Sodium: 133 mmol/L — ABNORMAL LOW (ref 134–144)
eGFR: 72 mL/min/1.73 (ref >59)

## 2024-05-06 DIAGNOSIS — M5416 Radiculopathy, lumbar region: Secondary | ICD-10-CM | POA: Diagnosis not present

## 2024-05-06 DIAGNOSIS — M6281 Muscle weakness (generalized): Secondary | ICD-10-CM | POA: Diagnosis not present

## 2024-05-08 DIAGNOSIS — I480 Paroxysmal atrial fibrillation: Secondary | ICD-10-CM | POA: Diagnosis not present

## 2024-05-08 DIAGNOSIS — Z23 Encounter for immunization: Secondary | ICD-10-CM | POA: Diagnosis not present

## 2024-05-08 DIAGNOSIS — Z7189 Other specified counseling: Secondary | ICD-10-CM | POA: Diagnosis not present

## 2024-05-08 DIAGNOSIS — M5416 Radiculopathy, lumbar region: Secondary | ICD-10-CM | POA: Diagnosis not present

## 2024-05-08 DIAGNOSIS — D6869 Other thrombophilia: Secondary | ICD-10-CM | POA: Diagnosis not present

## 2024-05-08 DIAGNOSIS — M6281 Muscle weakness (generalized): Secondary | ICD-10-CM | POA: Diagnosis not present

## 2024-05-08 DIAGNOSIS — Z7901 Long term (current) use of anticoagulants: Secondary | ICD-10-CM | POA: Diagnosis not present

## 2024-05-09 ENCOUNTER — Ambulatory Visit (HOSPITAL_BASED_OUTPATIENT_CLINIC_OR_DEPARTMENT_OTHER): Admitting: Family

## 2024-05-09 DIAGNOSIS — R600 Localized edema: Secondary | ICD-10-CM | POA: Diagnosis not present

## 2024-05-09 DIAGNOSIS — I272 Pulmonary hypertension, unspecified: Secondary | ICD-10-CM | POA: Diagnosis not present

## 2024-05-09 DIAGNOSIS — I5032 Chronic diastolic (congestive) heart failure: Secondary | ICD-10-CM

## 2024-05-09 MED ORDER — FUROSEMIDE 20 MG PO TABS: 20.0000 mg | ORAL_TABLET | Freq: Every day | ORAL | 3 refills | Status: AC

## 2024-05-09 NOTE — Patient Instructions (Addendum)
 Medication Instructions:  No changes *If you need a refill on your cardiac medications before your next appointment, please call your pharmacy*  Lab Work: BMP- In one month- you can come to our lab on the 3 rd floor. No appointment needed.  If you have labs (blood work) drawn today and your tests are completely normal, you will receive your results only by: MyChart Message (if you have MyChart) OR A paper copy in the mail If you have any lab test that is abnormal or we need to change your treatment, we will call you to review the results.  Testing/Procedures: None ordered  Follow-Up: At [facility], you and your health needs are our priority.  As part of our continuing mission to provide you with exceptional heart care, our providers are all part of one team.  This team includes your primary Cardiologist (physician) and Advanced Practice Providers or APPs (Physician Assistants and Nurse Practitioners) who all work together to provide you with the care you need, when you need it.  Your next appointment:    2-3 months  Provider:   Dr Lonni or Reche Finder, NP  We recommend signing up for the patient portal called MyChart.  Sign up information is provided on this After Visit Summary.  MyChart is used to connect with patients for Virtual Visits (Telemedicine).  Patients are able to view lab/test results, encounter notes, upcoming appointments, etc.  Non-urgent messages can be sent to your provider as well.   To learn more about what you can do with MyChart, go to [email or web address].    Recommend weighing daily and keeping a log. Please call our office if you have weight gain of 2 pounds overnight or 5 pounds in 1 week.   Date  Time Weight

## 2024-05-09 NOTE — Progress Notes (Unsigned)
  Cardiology Office Note   Date:  05/09/2024  ID:  Henry Holland, DOB November 04, 1932, MRN [id number] PCP: [name] [name] [name], MD  [facility] Providers Cardiologist:  Shelda Bruckner, MD { Click to update primary MD,subspecialty MD or APP then REFRESH:1}    History of Present Illness Henry Holland is a 89 y.o. male ***  Initially afte 40mg  dose felt his breathing was improved and had more energy.   Weight down 10 lbs by clinic scale. Weight down to 165 by home scale.   More urinary frequency on 40mg  dose. This has improved ont he 20mg  dose.     ROS: ***  Studies Reviewed      *** Risk Assessment/Calculations {Does this patient have ATRIAL FIBRILLATION?:[phone]}         Physical Exam VS:  BP 120/60   Pulse 67   Ht 6' 1 (1.854 m)   Wt 172 lb 14.4 oz (78.4 kg)   SpO2 97%   BMI 22.81 kg/m        Wt Readings from Last 3 Encounters:  05/09/24 172 lb 14.4 oz (78.4 kg)  04/25/24 182 lb (82.6 kg)  12/24/23 175 lb 11.3 oz (79.7 kg)    GEN: Well nourished, well developed in no acute distress NECK: No JVD; No carotid bruits CARDIAC: ***RRR, no murmurs, rubs, gallops RESPIRATORY:  Clear to auscultation without rales, wheezing or rhonchi  ABDOMEN: Soft, non-tender, non-distended EXTREMITIES:  No edema; No deformity   ASSESSMENT AND PLAN ***    {Are you ordering a CV Procedure (e.g. stress test, cath, DCCV, TEE, etc)?   Press F2        :[id number]}  Dispo: ***  Signed, Reche GORMAN Finder, NP

## 2024-05-11 ENCOUNTER — Encounter (HOSPITAL_BASED_OUTPATIENT_CLINIC_OR_DEPARTMENT_OTHER): Payer: Self-pay | Admitting: Family

## 2024-05-13 DIAGNOSIS — M6281 Muscle weakness (generalized): Secondary | ICD-10-CM | POA: Diagnosis not present

## 2024-05-13 DIAGNOSIS — M5416 Radiculopathy, lumbar region: Secondary | ICD-10-CM | POA: Diagnosis not present

## 2024-05-15 DIAGNOSIS — M5416 Radiculopathy, lumbar region: Secondary | ICD-10-CM | POA: Diagnosis not present

## 2024-05-15 DIAGNOSIS — M6281 Muscle weakness (generalized): Secondary | ICD-10-CM | POA: Diagnosis not present

## 2024-05-22 DIAGNOSIS — M6281 Muscle weakness (generalized): Secondary | ICD-10-CM | POA: Diagnosis not present

## 2024-05-22 DIAGNOSIS — M5416 Radiculopathy, lumbar region: Secondary | ICD-10-CM | POA: Diagnosis not present

## 2024-05-26 DIAGNOSIS — I5032 Chronic diastolic (congestive) heart failure: Secondary | ICD-10-CM | POA: Diagnosis not present

## 2024-05-26 DIAGNOSIS — R600 Localized edema: Secondary | ICD-10-CM | POA: Diagnosis not present

## 2024-05-27 ENCOUNTER — Ambulatory Visit (HOSPITAL_BASED_OUTPATIENT_CLINIC_OR_DEPARTMENT_OTHER): Payer: Self-pay | Admitting: Family

## 2024-05-27 DIAGNOSIS — M5416 Radiculopathy, lumbar region: Secondary | ICD-10-CM | POA: Diagnosis not present

## 2024-05-27 DIAGNOSIS — M6281 Muscle weakness (generalized): Secondary | ICD-10-CM | POA: Diagnosis not present

## 2024-05-27 LAB — BASIC METABOLIC PANEL WITH GFR
BUN/Creatinine Ratio: 26 — ABNORMAL HIGH (ref 10–24)
BUN: 26 mg/dL (ref 8–27)
CO2: 20 mmol/L (ref 20–29)
Calcium: 9.5 mg/dL (ref 8.6–10.2)
Chloride: 100 mmol/L (ref 96–106)
Creatinine, Ser: 1.01 mg/dL (ref 0.76–1.27)
Glucose: 190 mg/dL — ABNORMAL HIGH (ref 70–99)
Potassium: 4.6 mmol/L (ref 3.5–5.2)
Sodium: 138 mmol/L (ref 134–144)
eGFR: 71 mL/min/1.73 (ref >59)

## 2024-05-29 DIAGNOSIS — M6281 Muscle weakness (generalized): Secondary | ICD-10-CM | POA: Diagnosis not present

## 2024-05-29 DIAGNOSIS — M5416 Radiculopathy, lumbar region: Secondary | ICD-10-CM | POA: Diagnosis not present

## 2024-06-05 DIAGNOSIS — M5416 Radiculopathy, lumbar region: Secondary | ICD-10-CM | POA: Diagnosis not present

## 2024-06-05 DIAGNOSIS — M6281 Muscle weakness (generalized): Secondary | ICD-10-CM | POA: Diagnosis not present

## 2024-06-12 DIAGNOSIS — M5416 Radiculopathy, lumbar region: Secondary | ICD-10-CM | POA: Diagnosis not present

## 2024-06-12 DIAGNOSIS — M6281 Muscle weakness (generalized): Secondary | ICD-10-CM | POA: Diagnosis not present

## 2024-06-17 ENCOUNTER — Other Ambulatory Visit: Payer: Self-pay

## 2024-06-17 DIAGNOSIS — Z952 Presence of prosthetic heart valve: Secondary | ICD-10-CM

## 2024-06-17 DIAGNOSIS — L570 Actinic keratosis: Secondary | ICD-10-CM | POA: Diagnosis not present

## 2024-06-17 DIAGNOSIS — D1801 Hemangioma of skin and subcutaneous tissue: Secondary | ICD-10-CM | POA: Diagnosis not present

## 2024-06-17 DIAGNOSIS — L812 Freckles: Secondary | ICD-10-CM | POA: Diagnosis not present

## 2024-06-17 DIAGNOSIS — Z85828 Personal history of other malignant neoplasm of skin: Secondary | ICD-10-CM | POA: Diagnosis not present

## 2024-06-17 DIAGNOSIS — L821 Other seborrheic keratosis: Secondary | ICD-10-CM | POA: Diagnosis not present

## 2024-06-19 DIAGNOSIS — M5416 Radiculopathy, lumbar region: Secondary | ICD-10-CM | POA: Diagnosis not present

## 2024-06-19 DIAGNOSIS — M6281 Muscle weakness (generalized): Secondary | ICD-10-CM | POA: Diagnosis not present

## 2024-06-19 DIAGNOSIS — I480 Paroxysmal atrial fibrillation: Secondary | ICD-10-CM | POA: Diagnosis not present

## 2024-06-19 DIAGNOSIS — Z7901 Long term (current) use of anticoagulants: Secondary | ICD-10-CM | POA: Diagnosis not present

## 2024-06-19 DIAGNOSIS — D6869 Other thrombophilia: Secondary | ICD-10-CM | POA: Diagnosis not present

## 2024-06-24 DIAGNOSIS — M5416 Radiculopathy, lumbar region: Secondary | ICD-10-CM | POA: Diagnosis not present

## 2024-06-24 DIAGNOSIS — M6281 Muscle weakness (generalized): Secondary | ICD-10-CM | POA: Diagnosis not present

## 2024-06-26 ENCOUNTER — Telehealth: Payer: Self-pay | Admitting: Cardiology

## 2024-06-26 DIAGNOSIS — M6281 Muscle weakness (generalized): Secondary | ICD-10-CM | POA: Diagnosis not present

## 2024-06-26 DIAGNOSIS — M5416 Radiculopathy, lumbar region: Secondary | ICD-10-CM | POA: Diagnosis not present

## 2024-06-26 NOTE — Telephone Encounter (Signed)
 Left message for patient to call back

## 2024-06-26 NOTE — Telephone Encounter (Signed)
 Pt c/o medication issue:  1. Name of Medication:   furosemide  (LASIX ) 20 MG tablet    2. How are you currently taking this medication (dosage and times per day)? Take 1 tablet (20 mg total) by mouth daily.   3. Are you having a reaction (difficulty breathing--STAT)? No   4. What is your medication issue? Pt is having a stinging, aching feeling in his left leg and ankle that he feels may be related to the medication. Bruna denies any swelling. Please advise.

## 2024-06-26 NOTE — Telephone Encounter (Signed)
 Patient reports his legs have been hurting esp left leg, right above ankles recently.  Has been taking Lasix  which was reduced from 40 mg to 20 mg at ov on 05/09/24.  He and his wife were examining for swelling.  They are swollen right at the lower leg/ankle area.  His weight has been stable at around 166 pounds.  He sees no marks or bruises.  Does not recall bumping them on anything.  His wife could press on the left ankle and leave a fingerprint indention on it. eGFR wnl on 05/26/24.  He denies SOB.  He has PT twice a week and works out several days a week.    Reviewed with Dr. Lonni, okay to increase Lasix  to 40 mg for 3 days and then go back to previous dose.  He takes at around 5 pm daily.  Will take 40 mg Thurs, Fri and Sat and go back to 20 mg on Sun.  Will call or send a message on Monday with update on swelling and tenderness.  Appreciative for assistance today.

## 2024-06-26 NOTE — Telephone Encounter (Signed)
 Pt is returning nurse call and is requesting a callback. Please advise

## 2024-06-30 NOTE — Telephone Encounter (Signed)
 Called the patient. He said his wife said the swelling has come down a lot and the pain has gotten a lot better.  Weight went down to 161.7 and this am 163.1.  he is pleased with the improvement.  No lightheadedness.  Attends gym at [employer] and a couple days per week also has physical therapy.   He is aware to go back to 20 mg daily.  Will call if any further concerns.  Has appointment with MD 08/12/24.

## 2024-06-30 NOTE — Telephone Encounter (Signed)
 Pt calling to state he followed the medication directions given. His weight has been consistent and stable with no gain. He would like to know if he should go back on the 20mg  now. Please advise.

## 2024-07-01 DIAGNOSIS — M5416 Radiculopathy, lumbar region: Secondary | ICD-10-CM | POA: Diagnosis not present

## 2024-07-01 DIAGNOSIS — M6281 Muscle weakness (generalized): Secondary | ICD-10-CM | POA: Diagnosis not present

## 2024-07-03 DIAGNOSIS — M6281 Muscle weakness (generalized): Secondary | ICD-10-CM | POA: Diagnosis not present

## 2024-07-03 DIAGNOSIS — M5416 Radiculopathy, lumbar region: Secondary | ICD-10-CM | POA: Diagnosis not present

## 2024-07-08 DIAGNOSIS — M5416 Radiculopathy, lumbar region: Secondary | ICD-10-CM | POA: Diagnosis not present

## 2024-07-08 DIAGNOSIS — M6281 Muscle weakness (generalized): Secondary | ICD-10-CM | POA: Diagnosis not present

## 2024-07-10 DIAGNOSIS — M5416 Radiculopathy, lumbar region: Secondary | ICD-10-CM | POA: Diagnosis not present

## 2024-07-10 DIAGNOSIS — C61 Malignant neoplasm of prostate: Secondary | ICD-10-CM | POA: Diagnosis not present

## 2024-07-10 DIAGNOSIS — M6281 Muscle weakness (generalized): Secondary | ICD-10-CM | POA: Diagnosis not present

## 2024-07-15 DIAGNOSIS — M5416 Radiculopathy, lumbar region: Secondary | ICD-10-CM | POA: Diagnosis not present

## 2024-07-15 DIAGNOSIS — M6281 Muscle weakness (generalized): Secondary | ICD-10-CM | POA: Diagnosis not present

## 2024-07-17 DIAGNOSIS — M6281 Muscle weakness (generalized): Secondary | ICD-10-CM | POA: Diagnosis not present

## 2024-07-17 DIAGNOSIS — N401 Enlarged prostate with lower urinary tract symptoms: Secondary | ICD-10-CM | POA: Diagnosis not present

## 2024-07-17 DIAGNOSIS — R350 Frequency of micturition: Secondary | ICD-10-CM | POA: Diagnosis not present

## 2024-07-17 DIAGNOSIS — C61 Malignant neoplasm of prostate: Secondary | ICD-10-CM | POA: Diagnosis not present

## 2024-07-17 DIAGNOSIS — M5416 Radiculopathy, lumbar region: Secondary | ICD-10-CM | POA: Diagnosis not present

## 2024-07-17 DIAGNOSIS — R351 Nocturia: Secondary | ICD-10-CM | POA: Diagnosis not present

## 2024-07-22 DIAGNOSIS — Z7901 Long term (current) use of anticoagulants: Secondary | ICD-10-CM | POA: Diagnosis not present

## 2024-07-22 DIAGNOSIS — M6281 Muscle weakness (generalized): Secondary | ICD-10-CM | POA: Diagnosis not present

## 2024-07-22 DIAGNOSIS — M5416 Radiculopathy, lumbar region: Secondary | ICD-10-CM | POA: Diagnosis not present

## 2024-07-22 DIAGNOSIS — I480 Paroxysmal atrial fibrillation: Secondary | ICD-10-CM | POA: Diagnosis not present

## 2024-07-22 DIAGNOSIS — D6869 Other thrombophilia: Secondary | ICD-10-CM | POA: Diagnosis not present

## 2024-07-29 DIAGNOSIS — M5416 Radiculopathy, lumbar region: Secondary | ICD-10-CM | POA: Diagnosis not present

## 2024-07-29 DIAGNOSIS — M6281 Muscle weakness (generalized): Secondary | ICD-10-CM | POA: Diagnosis not present

## 2024-08-05 DIAGNOSIS — M5416 Radiculopathy, lumbar region: Secondary | ICD-10-CM | POA: Diagnosis not present

## 2024-08-05 DIAGNOSIS — M6281 Muscle weakness (generalized): Secondary | ICD-10-CM | POA: Diagnosis not present

## 2024-08-07 DIAGNOSIS — M6281 Muscle weakness (generalized): Secondary | ICD-10-CM | POA: Diagnosis not present

## 2024-08-07 DIAGNOSIS — M5416 Radiculopathy, lumbar region: Secondary | ICD-10-CM | POA: Diagnosis not present

## 2024-08-12 ENCOUNTER — Ambulatory Visit (HOSPITAL_BASED_OUTPATIENT_CLINIC_OR_DEPARTMENT_OTHER): Admitting: Cardiology

## 2024-08-12 ENCOUNTER — Encounter (HOSPITAL_BASED_OUTPATIENT_CLINIC_OR_DEPARTMENT_OTHER): Payer: Self-pay | Admitting: Cardiology

## 2024-08-12 VITALS — BP 122/60 | HR 78 | Ht 73.0 in | Wt 177.3 lb

## 2024-08-12 DIAGNOSIS — I4821 Permanent atrial fibrillation: Secondary | ICD-10-CM | POA: Diagnosis not present

## 2024-08-12 DIAGNOSIS — I5032 Chronic diastolic (congestive) heart failure: Secondary | ICD-10-CM

## 2024-08-12 DIAGNOSIS — Z952 Presence of prosthetic heart valve: Secondary | ICD-10-CM | POA: Diagnosis not present

## 2024-08-12 DIAGNOSIS — R600 Localized edema: Secondary | ICD-10-CM

## 2024-08-12 DIAGNOSIS — I272 Pulmonary hypertension, unspecified: Secondary | ICD-10-CM | POA: Diagnosis not present

## 2024-08-12 NOTE — Progress Notes (Signed)
 Cardiology Office Note:  .   Date:  08/12/2024  ID:  Henry Holland, DOB 02-23-1933, MRN [id number] PCP: [name] [name] [name], MD  [facility] Providers Cardiologist:  Shelda Bruckner, MD {  History of Present Illness: .   Henry Holland is a 89 y.o. male with a hx of permanent atrial fibrillation on coumadin, hypertension, aortic stenosis s/p TAVR who is seen for follow up today.    CV history: Atrial fibrillation: permanent, since 2002. Tolerating coumadin well, no significant bruising or bleeding. Aortic stenosis, echo 07/17/23 with severe low flow low gradient AS with mean gradient 25, AVA 0.7, DI 0.24. LV SV 48. Mild-moderate MR. Severe biatrial enlargement. Post TAVR echo 11/30/23 with EF 60-65%, severely elevated PA pressure of 65, moderate MR, mild MS, TAVR stable with trivial perivalvular leak, gradient 7.7 mmHg.   Today: Severe volume overload from visit 04/25/24 improved at close follow up with Reche Finder on 05/09/24. Since that time, doing much better. Has minimal LE edema, chronic. Compression stockings were difficult to put on, discussed either cotton based compression stockings or custom ones.  Feels that breathing is good. Goes to the gym three times/week. Energy better since TAVR. Appetite is not what it used to be but ok. Weighed 170 lbs at home yesterday, lowest was 164 lbs. 170 lbs is his normal.   ROS: Denies chest pain, shortness of breath at rest. No PND, orthopnea. No syncope or palpitations. ROS otherwise negative except as noted.   Studies Reviewed: SABRA    EKG:       Physical Exam:   VS:  BP 122/60   Pulse 78   Ht 6' 1 (1.854 m)   Wt 177 lb 4.8 oz (80.4 kg)   SpO2 98%   BMI 23.39 kg/m    Wt Readings from Last 3 Encounters:  08/12/24 177 lb 4.8 oz (80.4 kg)  05/09/24 172 lb 14.4 oz (78.4 kg)  04/25/24 182 lb (82.6 kg)    GEN: Well nourished, well developed in no acute distress HEENT: Normal, moist mucous membranes NECK: No JVD CARDIAC:  irregularly irregular rhythm, normal S1 and S2, no rubs or gallops. 1/6 systolic murmur. VASCULAR: Radial and DP pulses 2+ bilaterally. No carotid bruits RESPIRATORY:  Clear to auscultation without rales, wheezing or rhonchi  ABDOMEN: Soft, non-tender, non-distended MUSCULOSKELETAL:  Ambulates independently SKIN: Warm and dry, mild bilateral predominantly nonpitting edema NEUROLOGIC:  Alert and oriented x 3. No focal neuro deficits noted. PSYCHIATRIC:  Normal affect    ASSESSMENT AND PLAN: .    Severe aortic stenosis (low-flow low-gradient) s/p TAVR -echo with stable TAVR, normal LVEF, but severely elevated PA pressures as below   LE edema Pulmonary hypertension Chronic diastolic heart failure Moderate mitral regurgitation -Symptoms worsened post TAVR. Reviewed echo, valve functioning normally, but severe pulmonary hypertension of unclear etiology. Lungs were clear on exam, arguing against LV etiology. -improved significantly with lasix , continue furosemide  20 mg daily -now with mild predominantly nonpitting edema, most consistent with a component of venous insufficiency. -discussion compression, elevation, salt avoidance -weight is up slightly from last visit, but he appears euvolemic  Hypertension -continue losartan -lasix , diltiazem  as otherwise noted  Minimal nonobstructive CAD -no aspirin  as he is on coumadin -continue rosuvastatin   Permanent atrial fibrillation: rate controlled on diltiazem , long term anticoagulation with coumadin -CHA2DS2/VAS Stroke Risk Points=4. We have discussed DOAC but reasonable to continue coumadin per patient preference -rate control with diltiazem   Dispo: seeing Lamarr Hummer in 3 mos, will  have him see us  in 6 mos  Signed, Shelda Bruckner, MD   Shelda Bruckner, MD, PhD, [facility] [place]  [facility]  [place]  [facility] [address], [address] [place], [place] [address] [phone]

## 2024-08-12 NOTE — Patient Instructions (Addendum)
 Look into [employer] for fitted compression stockings: [address] [place], [place], [place] [address] [phone] [email or web address]  Degrees of compression: Mild (8-15 mmHg or 10-15 mmHg): Provides light support, often recommended for tired legs, travel, or minor swelling.   Medium (15-20 mmHg or 20-30 mmHg): Offers more support than mild, suitable for everyday wear, leg fatigue, and mild swelling.   Firm (20-30 mmHg or 30-40 mmHg): Used for varicose veins, venous issues, and swelling during pregnancy, and sometimes prescribed for post-surgery recovery.   If you are sensitive to compression stockings, start with the mild and if that is tolerated, aim for the moderate compression. If the moderate compression is well tolerated and works well for swelling, you can stay there; if the moderate doesn't manage the swelling, then you can try to go up to firm.    You can also look for cotton based compression stockings like Dr. Heriberto or Dr. Watson.   Follow up in 6 months with Dr. Lonni

## 2024-08-14 DIAGNOSIS — I480 Paroxysmal atrial fibrillation: Secondary | ICD-10-CM | POA: Diagnosis not present

## 2024-08-14 DIAGNOSIS — D6869 Other thrombophilia: Secondary | ICD-10-CM | POA: Diagnosis not present

## 2024-08-14 DIAGNOSIS — Z7901 Long term (current) use of anticoagulants: Secondary | ICD-10-CM | POA: Diagnosis not present

## 2024-09-29 ENCOUNTER — Emergency Department (HOSPITAL_BASED_OUTPATIENT_CLINIC_OR_DEPARTMENT_OTHER): Admitting: Radiology

## 2024-09-29 ENCOUNTER — Emergency Department (HOSPITAL_BASED_OUTPATIENT_CLINIC_OR_DEPARTMENT_OTHER): Admission: EM | Admit: 2024-09-29 | Discharge: 2024-09-29 | Disposition: A | Discharge status: Home / Self Care

## 2024-09-29 ENCOUNTER — Other Ambulatory Visit: Payer: Self-pay

## 2024-09-29 DIAGNOSIS — W1840XA Slipping, tripping and stumbling without falling, unspecified, initial encounter: Secondary | ICD-10-CM | POA: Insufficient documentation

## 2024-09-29 DIAGNOSIS — Z7901 Long term (current) use of anticoagulants: Secondary | ICD-10-CM | POA: Insufficient documentation

## 2024-09-29 DIAGNOSIS — Z79899 Other long term (current) drug therapy: Secondary | ICD-10-CM | POA: Insufficient documentation

## 2024-09-29 DIAGNOSIS — I4821 Permanent atrial fibrillation: Secondary | ICD-10-CM | POA: Insufficient documentation

## 2024-09-29 DIAGNOSIS — R0781 Pleurodynia: Secondary | ICD-10-CM | POA: Insufficient documentation

## 2024-09-29 DIAGNOSIS — I10 Essential (primary) hypertension: Secondary | ICD-10-CM | POA: Insufficient documentation

## 2024-09-29 DIAGNOSIS — Y9301 Activity, walking, marching and hiking: Secondary | ICD-10-CM | POA: Insufficient documentation

## 2024-09-29 DIAGNOSIS — R0789 Other chest pain: Secondary | ICD-10-CM

## 2024-09-29 MED ORDER — LIDOCAINE 5 % EX PTCH
1.0000 | MEDICATED_PATCH | CUTANEOUS | Status: DC
  Administered 2024-09-29: 1 via TRANSDERMAL
  Filled 2024-09-29: qty 1

## 2024-09-29 MED ORDER — LIDOCAINE 5 % EX PTCH: 1.0000 | MEDICATED_PATCH | CUTANEOUS | 0 refills | Status: AC

## 2024-09-29 NOTE — Discharge Instructions (Addendum)
 For pain, you can take 1000 mg of Tylenol  or 1 g of Tylenol  every 6-8 hours.  Do not exceed more than 4000 mg or 4 g in a 24-hour period.  Also use lidocaine  patches for pain.  As we discussed, with the incentive spirometer, please take deep breaths multiple times a day per day prevent pneumonia.  If you start having, fever or chills then please, come back to ED.

## 2024-09-29 NOTE — ED Notes (Signed)
 DC paperwork given and verbally understood.

## 2024-09-29 NOTE — ED Triage Notes (Signed)
 Tripped on rug last night and land right chest on table. Warfarin- afib. Denies head strike. Complains only of right rib pain and sciatic flaring up in right side as well. Hx lumbar fractures several weeks ago.

## 2024-10-30 ENCOUNTER — Ambulatory Visit: Admitting: Physician Assistant

## 2024-10-30 ENCOUNTER — Other Ambulatory Visit (HOSPITAL_COMMUNITY)
